# Patient Record
Sex: Male | Born: 1970 | Race: Black or African American | Hispanic: No | Smoking: Current some day smoker
Health system: Southern US, Community
[De-identification: ages and names within clinical notes are randomized; demographics above are authoritative.]

## PROBLEM LIST (undated history)

## (undated) DIAGNOSIS — M1990 Unspecified osteoarthritis, unspecified site: Secondary | ICD-10-CM

## (undated) DIAGNOSIS — E119 Type 2 diabetes mellitus without complications: Secondary | ICD-10-CM

## (undated) DIAGNOSIS — R0609 Other forms of dyspnea: Secondary | ICD-10-CM

## (undated) DIAGNOSIS — I10 Essential (primary) hypertension: Secondary | ICD-10-CM

## (undated) DIAGNOSIS — R079 Chest pain, unspecified: Secondary | ICD-10-CM

## (undated) HISTORY — DX: Other forms of dyspnea: R06.09

## (undated) HISTORY — PX: WRIST SURGERY: SHX841

## (undated) HISTORY — PX: FOOT SURGERY: SHX648

## (undated) HISTORY — DX: Chest pain, unspecified: R07.9

---

## 2002-06-15 ENCOUNTER — Emergency Department (HOSPITAL_COMMUNITY): Admission: EM | Admit: 2002-06-15 | Discharge: 2002-06-15 | Payer: Self-pay

## 2003-10-15 ENCOUNTER — Inpatient Hospital Stay (HOSPITAL_COMMUNITY): Admission: EM | Admit: 2003-10-15 | Discharge: 2003-10-16 | Payer: Self-pay | Admitting: *Deleted

## 2003-10-25 ENCOUNTER — Encounter: Admission: RE | Admit: 2003-10-25 | Discharge: 2003-10-25 | Payer: Self-pay | Admitting: Internal Medicine

## 2004-07-03 ENCOUNTER — Emergency Department (HOSPITAL_COMMUNITY): Admission: EM | Admit: 2004-07-03 | Discharge: 2004-07-03 | Payer: Self-pay | Admitting: Emergency Medicine

## 2005-04-20 ENCOUNTER — Emergency Department (HOSPITAL_COMMUNITY): Admission: EM | Admit: 2005-04-20 | Discharge: 2005-04-20 | Payer: Self-pay | Admitting: *Deleted

## 2008-07-04 ENCOUNTER — Emergency Department (HOSPITAL_COMMUNITY): Admission: EM | Admit: 2008-07-04 | Discharge: 2008-07-04 | Payer: Self-pay | Admitting: *Deleted

## 2010-02-23 ENCOUNTER — Emergency Department (HOSPITAL_COMMUNITY): Admission: EM | Admit: 2010-02-23 | Discharge: 2010-02-23 | Payer: Self-pay | Admitting: Family Medicine

## 2010-03-22 ENCOUNTER — Emergency Department (HOSPITAL_COMMUNITY): Admission: EM | Admit: 2010-03-22 | Discharge: 2010-03-22 | Payer: Self-pay | Admitting: Family Medicine

## 2011-04-06 NOTE — Discharge Summary (Signed)
NAME:  Jeffery Houston, Jeffery Houston NO.:  [id number]   MEDICAL RECORD NO.:  [id number]                   PATIENT TYPE:  INP   LOCATION:  5729                                 FACILITY:  [facility]   PHYSICIAN:  Madaline Guthrie, M.D.                 DATE OF BIRTH:  January 19, 1971   DATE OF ADMISSION:  10/15/2003  DATE OF DISCHARGE:  10/16/2003                                 DISCHARGE SUMMARY   DISCHARGE DIAGNOSIS:  Fungal infection of both feet with bacterial versus  fungal abscess growth underneath the soles of feet.   DISCHARGE MEDICATIONS:  1. Lamisil 250 mg p.o. daily x3 months.  2. Keflex 500 mg four times daily x1 week p.o.  3. Over-the-counter antifungal cream applied daily to feet.   CONDITION AT DISCHARGE:  Condition at discharge was stable.   BRIEF ADMITTING HISTORY AND PHYSICAL:  This is a 40 year old African  American male with chief complaint of swelling in the ED.  After he had  eaten Thanksgiving dinner, the patient noticed that his arms and parts of  his face were swelling.  He came to the emergency department and was given  80 mg of prednisone and Benadryl and swelling subsided.  The patient then  mentioned to the ED attending that he had noticed small blisters on his feet  for the last week and a half, first noticed it on his left sole 12 days  prior to admission.  He initially had no pain but the blisters spread to the  soles of both feet over 1 week and developed 2/10 pinprick pain and some  pain with walking.  He first noticed yellow pustules on the bottoms of both  feet the night before admission.  The patient denied any new footwear or  exposures, does not complain of itching or numbness.  The pain does not  radiate.  The patient has had no fevers or chills and no similar problems  like this in the past.  The patient works in [employer].   ALLERGIES:  The patient has no allergies.   PAST MEDICAL HISTORY:  No past medical history.  No  hospitalizations.   MEDICATIONS:  Takes no medications.   SOCIAL HISTORY:  He is a current smoker, 1 pack per day for 15 years.  He  drinks 1 beer per day, denies cocaine use.  The patient is engaged and lives  with his fiancee.  The patient was recently in prison for 3 months and was  released in September.   FAMILY HISTORY:  Mother with diabetes, father with hypertension.  Siblings:  Sister died of unknown causes in 34.  The patient has four children with  no medical problems.   REVIEW OF SYSTEMS:  Review of systems as per HPI, otherwise, negative.   PHYSICAL EXAMINATION:  VITALS ON ADMISSION:  Pulse 97, blood pressure  144/76, temperature 98.1, respirations 20, oxygen saturation  98% on room  air.  GENERAL:  No apparent distress.  Appears stated age.  HEENT:  Eyes:  Extraocular muscles intact.  Pupils equal, round and reactive  to light and accommodation.  No scleral icterus.  ENT:  Nose and throat  clear.  Mucous membranes moist with mild edema of the lower lip.  NECK:  No stiffness or meningismus.  RESPIRATORY:  Clear to auscultation bilaterally.  Good respiratory effort.  CARDIOVASCULAR:  Regular rate and rhythm.  No murmurs, rubs, or gallops.  Pulses 2+ and equal.  GI:  Soft, nontender and nondistended.  Normoactive bowel sounds.  No  hepatosplenomegaly.  EXTREMITIES:  Bilateral hands mildly edematous.  No lower extremity edema.  Extremities nontender.  GU:  No discharge, no rash.  SKIN:  Bilateral soles of feet were callused with painless blisters and  pustules, fluid filled; some material was able to be expressed from a small  cut in the sole of his foot.  There was fungal infection of the toes of both  feet, otherwise, the skin was normal with no other rashes or lesions.  LYMPHATICS:  No lymphadenopathy.  MUSCULOSKELETAL:  Normal strength, full range of motion.  No sensory  deficits.  NEUROLOGIC:  Cranial nerves II-XII grossly intact.  PSYCHIATRIC:  Awake, alert and  oriented x3.   ADMISSION LABORATORIES:  White blood cell count 14.7, hemoglobin 16.5,  hematocrit 48.9, platelets 267,000; ANC was 13.5.  Gram's stain and culture  of the wounds were taken in the emergency department.   HOSPITAL COURSE:  The patient was started on IV vancomycin.  Cultures were  taken of the fluid expressed from his feet.  The patient's hospital course  was unremarkable.  He remained afebrile overnight and it was determined that  he was ready for discharge and could be followed up as an outpatient with  oral antibiotics and oral antifungal medications.   DISCHARGE LABORATORIES:  Urine drug screen was positive for THC.  Blood  cultures were negative x2.  HIV antibody was negative.  Discharge CBC:  White count of 17.8, hemoglobin 14.6, hematocrit 42.0 and platelet count of  305,000.  Note:  White blood cell count likely elevated secondary to 80 mg  of prednisone received in the emergency department the previous night.  RPR  was nonreactive.  Chlamydial CR was negative.  Secondary bacterial culture  of foot showed gram-positive rods.  Fungal culture of foot shows no growth  to date.   FOLLOWUP:  The patient has an outpatient followup appointment on Monday,  October 25, 2003, in the outpatient clinic at [facility].      Hillery Aldo, M.D.                      Madaline Guthrie, M.D.    CR/MEDQ  D:  10/19/2003  T:  10/20/2003  Job:  [id number]

## 2013-03-24 ENCOUNTER — Encounter (HOSPITAL_COMMUNITY): Payer: Self-pay | Admitting: Emergency Medicine

## 2013-03-24 ENCOUNTER — Emergency Department (HOSPITAL_COMMUNITY)
Admission: EM | Admit: 2013-03-24 | Discharge: 2013-03-24 | Disposition: A | Discharge status: Home / Self Care | Payer: Medicaid Other | Attending: Emergency Medicine | Admitting: Emergency Medicine

## 2013-03-24 ENCOUNTER — Emergency Department (HOSPITAL_COMMUNITY): Payer: Medicaid Other

## 2013-03-24 DIAGNOSIS — Z91040 Latex allergy status: Secondary | ICD-10-CM | POA: Insufficient documentation

## 2013-03-24 DIAGNOSIS — Z8739 Personal history of other diseases of the musculoskeletal system and connective tissue: Secondary | ICD-10-CM | POA: Insufficient documentation

## 2013-03-24 DIAGNOSIS — M25569 Pain in unspecified knee: Secondary | ICD-10-CM | POA: Insufficient documentation

## 2013-03-24 DIAGNOSIS — Z9889 Other specified postprocedural states: Secondary | ICD-10-CM | POA: Insufficient documentation

## 2013-03-24 DIAGNOSIS — I10 Essential (primary) hypertension: Secondary | ICD-10-CM | POA: Insufficient documentation

## 2013-03-24 DIAGNOSIS — M25562 Pain in left knee: Secondary | ICD-10-CM

## 2013-03-24 HISTORY — DX: Essential (primary) hypertension: I10

## 2013-03-24 MED ORDER — HYDROCODONE-ACETAMINOPHEN 5-325 MG PO TABS: 1.0000 | ORAL_TABLET | Freq: Four times a day (QID) | ORAL | Status: DC | PRN

## 2013-03-24 MED ORDER — HYDROCHLOROTHIAZIDE 25 MG PO TABS: 25.0000 mg | ORAL_TABLET | Freq: Every day | ORAL | Status: DC

## 2013-03-24 NOTE — ED Provider Notes (Signed)
History     CSN: [id number]  Arrival date & time 03/24/13  1146   First MD Initiated Contact with Patient 03/24/13 1251      Chief Complaint  Patient presents with  . Knee Pain    (Consider location/radiation/quality/duration/timing/severity/associated sxs/prior treatment) HPI Comments: Patient presents with bilateral knee pain.  Pain has been present for the past 2 years, but worsened over the past week.  Pain worse with going up stairs, bending, and ambulating.  He also reports that he does a lot of standing at work, which worsens the pain.  No acute injury or trauma.  He has had Arthroscopic surgery of both knees in the past.  Orthopedist was in [place].  He has taken Ibuprofen and Tylenol with minimal relief.  He reports mild swelling of both knees.  No erythema or warmth of the knees.  No fever or chills.  No numbness or tingling.  The history is provided by the patient.    Past Medical History  Diagnosis Date  . Hypertension     History reviewed. No pertinent past surgical history.  History reviewed. No pertinent family history.  History  Substance Use Topics  . Smoking status: Never Smoker   . Smokeless tobacco: Current User  . Alcohol Use: Yes      Review of Systems  Allergies  Latex  Home Medications   Current Outpatient Rx  Name  Route  Sig  Dispense  Refill  . hydrochlorothiazide (HYDRODIURIL) 25 MG tablet   Oral   Take 25 mg by mouth daily.           BP 159/62  Pulse 83  Temp(Src) 98.5 F (36.9 C) (Oral)  Resp 18  SpO2 96%  Physical Exam  Nursing note and vitals reviewed. Constitutional: He appears well-developed and well-nourished. No distress.  HENT:  Head: Normocephalic and atraumatic.  Neck: Normal range of motion. Neck supple.  Cardiovascular: Normal rate, regular rhythm and normal heart sounds.   Pulses:      Dorsalis pedis pulses are 2+ on the right side, and 2+ on the left side.  Pulmonary/Chest: Effort normal and breath sounds  normal.  Musculoskeletal:       Right knee: He exhibits normal range of motion, no effusion, no deformity, no erythema and normal patellar mobility.       Left knee: He exhibits normal range of motion, no swelling, no effusion, no ecchymosis, no deformity and no erythema.  Neurological: He is alert. No sensory deficit.  Skin: Skin is warm and dry. He is not diaphoretic.  No erythema or warmth of the knees bilaterally  Psychiatric: He has a normal mood and affect.    ED Course  Procedures (including critical care time)  Labs Reviewed - No data to display Dg Knee Complete 4 Views Left  03/24/2013  *RADIOLOGY REPORT*  Clinical Data: Knee pain  LEFT KNEE - COMPLETE 4+ VIEW  Comparison: None.  Findings: Four views of the left knee submitted.  No acute fracture or subluxation.  No radiopaque foreign body.  IMPRESSION: No acute fracture or subluxation.   Original Report Authenticated By: Natasha Mead, M.D.    Dg Knee Complete 4 Views Right  03/24/2013  *RADIOLOGY REPORT*  Clinical Data: Bilateral knee pain  RIGHT KNEE - COMPLETE 4+ VIEW  Comparison: None.  Findings: Four views of the right knee submitted.  No acute fracture or subluxation.  Mild narrowing of medial joint compartment.  Mild spurring of medial tibial plateau.  Mild spurring  of the patella.  IMPRESSION: No acute fracture or subluxation.  Mild degenerative changes.   Original Report Authenticated By: Natasha Mead, M.D.      No diagnosis found.    MDM  Patient presenting with bilateral knee pain.  He reports that he has been diagnosed with Arthritis in the past and has had Arthroscopic surgery of both knees in the past.  No signs of infection at this time.  Patient able to ambulate.  Neurovascularly intact.  Patient given short course of pain medication and referral to Orthopedics.  Return precautions given.        Pascal Lux [place], PA-C 03/25/13 [phone]

## 2013-03-24 NOTE — ED Notes (Signed)
Pt c/o bilateral knee pain x several days; pt denies new injury at present but hx of pain in knees

## 2013-03-26 NOTE — ED Provider Notes (Signed)
Medical screening examination/treatment/procedure(s) were performed by non-physician practitioner and as supervising physician I was immediately available for consultation/collaboration.   Jeremih Dearmas, MD 03/26/13 1530 

## 2013-04-11 ENCOUNTER — Encounter (HOSPITAL_COMMUNITY): Payer: Self-pay | Admitting: Family Medicine

## 2013-04-11 ENCOUNTER — Emergency Department (HOSPITAL_COMMUNITY)
Admission: EM | Admit: 2013-04-11 | Discharge: 2013-04-11 | Disposition: A | Discharge status: Home / Self Care | Payer: Medicaid Other | Attending: Emergency Medicine | Admitting: Emergency Medicine

## 2013-04-11 DIAGNOSIS — S0502XA Injury of conjunctiva and corneal abrasion without foreign body, left eye, initial encounter: Secondary | ICD-10-CM

## 2013-04-11 DIAGNOSIS — X58XXXA Exposure to other specified factors, initial encounter: Secondary | ICD-10-CM | POA: Insufficient documentation

## 2013-04-11 DIAGNOSIS — Y9389 Activity, other specified: Secondary | ICD-10-CM | POA: Insufficient documentation

## 2013-04-11 DIAGNOSIS — I10 Essential (primary) hypertension: Secondary | ICD-10-CM | POA: Insufficient documentation

## 2013-04-11 DIAGNOSIS — S058X9A Other injuries of unspecified eye and orbit, initial encounter: Secondary | ICD-10-CM | POA: Insufficient documentation

## 2013-04-11 DIAGNOSIS — Y9289 Other specified places as the place of occurrence of the external cause: Secondary | ICD-10-CM | POA: Insufficient documentation

## 2013-04-11 DIAGNOSIS — Z23 Encounter for immunization: Secondary | ICD-10-CM | POA: Insufficient documentation

## 2013-04-11 MED ORDER — ERYTHROMYCIN 5 MG/GM OP OINT: TOPICAL_OINTMENT | OPHTHALMIC | Status: DC

## 2013-04-11 MED ORDER — FLUORESCEIN SODIUM 1 MG OP STRP
1.0000 | ORAL_STRIP | Freq: Once | OPHTHALMIC | Status: AC
  Administered 2013-04-11: 1 via OPHTHALMIC
  Filled 2013-04-11: qty 1

## 2013-04-11 MED ORDER — TETANUS-DIPHTH-ACELL PERTUSSIS 5-2.5-18.5 LF-MCG/0.5 IM SUSP
0.5000 mL | Freq: Once | INTRAMUSCULAR | Status: AC
  Administered 2013-04-11: 0.5 mL via INTRAMUSCULAR
  Filled 2013-04-11: qty 0.5

## 2013-04-11 MED ORDER — TETRACAINE HCL 0.5 % OP SOLN
2.0000 [drp] | Freq: Once | OPHTHALMIC | Status: AC
  Administered 2013-04-11: 2 [drp] via OPHTHALMIC
  Filled 2013-04-11: qty 2

## 2013-04-11 NOTE — ED Provider Notes (Signed)
Medical screening examination/treatment/procedure(s) were performed by non-physician practitioner and as supervising physician I was immediately available for consultation/collaboration.   Carleene Cooper III, MD 04/11/13 2017

## 2013-04-11 NOTE — ED Provider Notes (Signed)
History     CSN: [id number]  Arrival date & time 04/11/13  0907   First MD Initiated Contact with Patient 04/11/13 [phone]      Chief Complaint  Patient presents with  . Eye Problem    (Consider location/radiation/quality/duration/timing/severity/associated sxs/prior treatment) HPI Comments: Jeffery Houston is a 42 y/o M with PMHx of HTN presenting to the ED, with wife, complaining about left eye pain. Patient stated that the discomfort started on yesterday morning. Reported that he was sitting outside, looking up at the trees on Thursday and felt like something fell into his eye. Stated that he has "foreign body" sensation - stated that "there is something in my eye." reported that he has been having mildly blurry vision secondary due to the tearing of eyes. Stated that he had an episode of blurry vision the other day - reported that it was only one episode. Stated that he has been having pain to the left eye that is constant, stating it as something in his eye, reporting that the discomfort gets worse when he closes his eyes. Patient stated that he has sensitivity to light, pain is worse when blinking and closing his eyes, worse when scratching his eyes. Stated that there has been mild swelling and redness noted starting yesterday to the left eye. Stated that when he woke up this morning he had "crusts" develop in his left eyelashes and had to separate the eyelid by hand to open. Denied fever, chills, sweating, discharge from the eye, loss of vision, ear pain, congestion, headache.      The history is provided by the patient. No language interpreter was used.    Past Medical History  Diagnosis Date  . Hypertension     History reviewed. No pertinent past surgical history.  History reviewed. No pertinent family history.  History  Substance Use Topics  . Smoking status: Never Smoker   . Smokeless tobacco: Current User  . Alcohol Use: Yes      Review of Systems  Constitutional:  Negative for fever, chills and fatigue.  HENT: Negative for congestion, sore throat, trouble swallowing, neck pain and neck stiffness.   Eyes: Positive for photophobia, pain, redness and visual disturbance.       Foreign body sensation to the left eye  Respiratory: Negative for cough, chest tightness and shortness of breath.   Cardiovascular: Negative for chest pain.  Gastrointestinal: Negative for abdominal pain.  Genitourinary: Negative for decreased urine volume and difficulty urinating.  Neurological: Negative for dizziness, light-headedness and headaches.  All other systems reviewed and are negative.    Allergies  Latex  Home Medications   Current Outpatient Rx  Name  Route  Sig  Dispense  Refill  . hydrochlorothiazide (HYDRODIURIL) 25 MG tablet   Oral   Take 25 mg by mouth daily.         Marland Kitchen HYDROcodone-acetaminophen (NORCO/VICODIN) 5-325 MG per tablet   Oral   Take 1-2 tablets by mouth every 6 (six) hours as needed for pain.   30 tablet   0   . erythromycin ophthalmic ointment      Place a 1/2 inch ribbon of ointment into the lower eyelid QID x 5 days.   3.5 g   0     There were no vitals taken for this visit.  Physical Exam  Nursing note and vitals reviewed. Constitutional: He is oriented to person, place, and time. He appears well-developed and well-nourished. No distress.  HENT:  Head: Normocephalic and atraumatic.  Mouth/Throat: Oropharynx is clear and moist. No oropharyngeal exudate.  Uvula midline, symmetrical elevation  Eyes: Conjunctivae and EOM are normal. Pupils are equal, round, and reactive to light. Right eye exhibits no discharge and no exudate. Left eye exhibits no discharge. Foreign body present in the left eye. Right conjunctiva is not injected. Right conjunctiva has no hemorrhage. Left conjunctiva has no hemorrhage. Right eye exhibits normal extraocular motion and no nystagmus. Left eye exhibits normal extraocular motion and no nystagmus. Right  pupil is round and reactive. Left pupil is round and reactive.  Slit lamp exam:      The right eye shows no corneal abrasion, no corneal flare, no corneal ulcer, no foreign body, no hyphema, no hypopyon and no fluorescein uptake.       The left eye shows corneal abrasion. The left eye shows no corneal flare, no corneal ulcer, no foreign body, no hyphema, no hypopyon and no fluorescein uptake.    Left eye: very minimal swelling noted to the left eye. Very little erythema noted to the left eye. Sclera white without injection. Positive tearing upon exam. Full ROM without pain.   Neck: Normal range of motion. Neck supple. No tracheal deviation present. No thyromegaly present.  Negative neck stiffness Negative nuchal rigidity Negative lymphadenopathy  Cardiovascular: Normal rate, regular rhythm and normal heart sounds.  Exam reveals no friction rub.   No murmur heard. Pulses:      Radial pulses are 2+ on the right side, and 2+ on the left side.  Pulmonary/Chest: Effort normal and breath sounds normal. No respiratory distress. He has no wheezes. He has no rales.  Lymphadenopathy:    He has no cervical adenopathy.  Neurological: He is alert and oriented to person, place, and time. No cranial nerve deficit. He exhibits normal muscle tone. Coordination normal.  Skin: Skin is warm and dry. No rash noted. He is not diaphoretic. No erythema.  Psychiatric: He has a normal mood and affect. His behavior is normal. Thought content normal.    ED Course  Procedures (including critical care time)  Labs Reviewed - No data to display No results found.   1. Corneal abrasion, left, initial encounter   2. HTN (hypertension)       MDM  Patient afebrile, normotensive, non-tachycardic, non-tachypneic, alert and oriented. Patient presenting with corneal abrasion noted to the left eye. Left eye swiped with swab and negative foreign body noted. Slit Lamp exam noted corneal abrasion to the left eye.  Flourescein negative for Seidel's sign. Negative dendritic lesions noted. Mild swelling noted to the left eye - less likely to think preseptal cellulitis. Visual acuity negative findings - right 20/30, left 20/50. Negative pain with motion of EOMs - EOMs fully intact - less likely to think postseptal cellulitis. Patient aseptic, non-toxic appearing, in no acute distress. Discharged patient. Discharged patient with Erythromycin ointment to be applied to left eye - discussed instructions with patient. Discussed with patient eye protection, eye care, and protection. Referred patient t [facility] and ophthalmology. Discussed with patient to monitor symptoms and if symptoms are to worsen or change to report back to the ED. Patient agreed to plan of care, understood, all questions answered.  TDaP booster vaccine given.         Raymon Mutton, PA-C 04/11/13 1745

## 2013-04-11 NOTE — ED Notes (Signed)
Patient advises that he was sitting outside on Wednesday evening and something fell into his left eye. He advises that he had a crusting noted to the left eye. Complaints of pain in the left eye.

## 2013-04-11 NOTE — ED Notes (Signed)
Per pt sts a few days of eye irritation. sts he had flushed it out but still has the sensation that something is in there. sts the eyedrops caused burning.

## 2015-04-01 ENCOUNTER — Emergency Department (HOSPITAL_COMMUNITY)
Admission: EM | Admit: 2015-04-01 | Discharge: 2015-04-01 | Disposition: A | Discharge status: Home / Self Care | Payer: Medicaid Other | Attending: Emergency Medicine | Admitting: Emergency Medicine

## 2015-04-01 ENCOUNTER — Encounter (HOSPITAL_COMMUNITY): Payer: Self-pay | Admitting: Emergency Medicine

## 2015-04-01 DIAGNOSIS — R6884 Jaw pain: Secondary | ICD-10-CM | POA: Diagnosis present

## 2015-04-01 DIAGNOSIS — E119 Type 2 diabetes mellitus without complications: Secondary | ICD-10-CM | POA: Insufficient documentation

## 2015-04-01 DIAGNOSIS — I10 Essential (primary) hypertension: Secondary | ICD-10-CM | POA: Diagnosis not present

## 2015-04-01 DIAGNOSIS — Z79899 Other long term (current) drug therapy: Secondary | ICD-10-CM | POA: Insufficient documentation

## 2015-04-01 DIAGNOSIS — M26629 Arthralgia of temporomandibular joint, unspecified side: Secondary | ICD-10-CM

## 2015-04-01 DIAGNOSIS — Z91040 Latex allergy status: Secondary | ICD-10-CM | POA: Insufficient documentation

## 2015-04-01 DIAGNOSIS — M2662 Arthralgia of temporomandibular joint: Secondary | ICD-10-CM | POA: Insufficient documentation

## 2015-04-01 HISTORY — DX: Type 2 diabetes mellitus without complications: E11.9

## 2015-04-01 MED ORDER — KETOROLAC TROMETHAMINE 60 MG/2ML IM SOLN
60.0000 mg | Freq: Once | INTRAMUSCULAR | Status: AC
  Administered 2015-04-01: 60 mg via INTRAMUSCULAR
  Filled 2015-04-01: qty 2

## 2015-04-01 MED ORDER — CYCLOBENZAPRINE HCL 10 MG PO TABS: 10.0000 mg | ORAL_TABLET | Freq: Two times a day (BID) | ORAL | Status: DC | PRN

## 2015-04-01 NOTE — Discharge Instructions (Signed)
Return to the emergency room with worsening of symptoms, new symptoms or with symptoms that are concerning, especially unable to open your mouth.  Rest, Ice (three cycles of 15 mins on, 15 mins off at least twice a day) Ibuprofen 400mg  (2 tablets 200mg ) every 5-6 hours for 3-5 days. Flexeril for severe pain at night. Soft foods diet Follow up with PCP if symptoms worsen or are persistent. Read below information and follow recommendations. Temporomandibular Problems  Temporomandibular joint (TMJ) dysfunction means there are problems with the joint between your jaw and your skull. This is a joint lined by cartilage like other joints in your body but also has a small disc in the joint which keeps the bones from rubbing on each other. These joints are like other joints and can get inflamed (sore) from arthritis and other problems. When this joint gets sore, it can cause headaches and pain in the jaw and the face. CAUSES  Usually the arthritic types of problems are caused by soreness in the joint. Soreness in the joint can also be caused by overuse. This may come from grinding your teeth. It may also come from mis-alignment in the joint. DIAGNOSIS Diagnosis of this condition can often be made by history and exam. Sometimes your caregiver may need X-rays or an MRI scan to determine the exact cause. It may be necessary to see your dentist to determine if your teeth and jaws are lined up correctly. TREATMENT  Most of the time this problem is not serious; however, sometimes it can persist (become chronic). When this happens medications that will cut down on inflammation (soreness) help. Sometimes a shot of cortisone into the joint will be helpful. If your teeth are not aligned it may help for your dentist to make a splint for your mouth that can help this problem. If no physical problems can be found, the problem may come from tension. If tension is found to be the cause, biofeedback or relaxation techniques  may be helpful. HOME CARE INSTRUCTIONS   Later in the day, applications of ice packs may be helpful. Ice can be used in a plastic bag with a towel around it to prevent frostbite to skin. This may be used about every 2 hours for 20 to 30 minutes, as needed while awake, or as directed by your caregiver.  Only take over-the-counter or prescription medicines for pain, discomfort, or fever as directed by your caregiver.  If physical therapy was prescribed, follow your caregiver's directions.  Wear mouth appliances as directed if they were given. Document Released: 07/31/2001 Document Revised: 01/28/2012 Document Reviewed: 11/07/2008 [facility]CExitCare Patient Information 2015 [place], [place]. This information is not intended to replace advice given to you by your health care provider. Make sure you discuss any questions you have with your health care provider.

## 2015-04-01 NOTE — ED Provider Notes (Signed)
CSN: [id number]     Arrival date & time 04/01/15  [id number] History  This chart was scribed for non-physician practitioner, Oswaldo ConroyVictoria Neysa Arts, working with Gilda Creasehristopher J Pollina, MD by Richarda Overlieichard Holland, ED Scribe. This patient was seen in room TR08C/TR08C and the patient's care was started at 9:39 AM.  Chief Complaint  Patient presents with  . Jaw Pain   The history is provided by the patient. No language interpreter was used.   HPI Comments: Jeffery Houston is a 44 y.o. male with a history of HTN and DM who presents to the Emergency Department complaining of right sided TMJ jaw pain for the last week. Pt states that chewing aggravates his pain and states when he bites into anything it feels like it is popping out of socket. He says he has tried no treatments or medications for his pain. Pt reports no similar prior episodes. He denies any history of smoking. Pt denies any new life stressors and says that he does not chew on anything. Pt reports no alleviating factors at this time. He denies fever, chills, nausea, sore throat or vomiting.   Past Medical History  Diagnosis Date  . Hypertension   . Diabetes mellitus without complication    Past Surgical History  Procedure Laterality Date  . Foot surgery     No family history on file. History  Substance Use Topics  . Smoking status: Never Smoker   . Smokeless tobacco: Current User  . Alcohol Use: Yes    Review of Systems  Constitutional: Negative for fever and chills.  HENT: Negative for sore throat.   Gastrointestinal: Negative for nausea and vomiting.  Musculoskeletal: Positive for arthralgias. Negative for neck pain.   Allergies  Latex  Home Medications   Prior to Admission medications   Medication Sig Start Date End Date Taking? Authorizing Provider  cyclobenzaprine (FLEXERIL) 10 MG tablet Take 1 tablet (10 mg total) by mouth 2 (two) times daily as needed for muscle spasms. 04/01/15   Oswaldo ConroyVictoria Panagiotis Oelkers, PA-C  erythromycin ophthalmic  ointment Place a 1/2 inch ribbon of ointment into the lower eyelid QID x 5 days. 04/11/13   Marissa Sciacca, PA-C  hydrochlorothiazide (HYDRODIURIL) 25 MG tablet Take 25 mg by mouth daily.    Historical Provider, MD  HYDROcodone-acetaminophen (NORCO/VICODIN) 5-325 MG per tablet Take 1-2 tablets by mouth every 6 (six) hours as needed for pain. 03/24/13   Heather Laisure, PA-C   BP 147/88 mmHg  Pulse 76  Temp(Src) 97.8 F (36.6 C) (Oral)  Resp 18  Ht 6' 4.5" (1.943 m)  Wt 283 lb (128.368 kg)  BMI 34.00 kg/m2  SpO2 98%   Physical Exam  Constitutional: He appears well-developed and well-nourished. No distress.  HENT:  Head: Normocephalic and atraumatic.  Tenderness to right TMJ. No crepitus to right or left TMJ. No overlying skin changes. No lymphadenopathy. Oropharynx no erythema or edema. No trismus. Uvula midline.   Eyes: Conjunctivae are normal. Right eye exhibits no discharge. Left eye exhibits no discharge.  Pulmonary/Chest: Effort normal. No respiratory distress.  Lymphadenopathy:    He has no cervical adenopathy.  Neurological: He is alert. Coordination normal.  Skin: No rash noted. He is not diaphoretic.  Psychiatric: He has a normal mood and affect. His behavior is normal.  Nursing note and vitals reviewed.   ED Course  Procedures   DIAGNOSTIC STUDIES: Oxygen Saturation is 98% on RA, normal by my interpretation.    COORDINATION OF CARE: 9:45 AM Discussed treatment plan with pt  at bedside and pt agreed to plan.  Labs Review Labs Reviewed - No data to display  Imaging Review No results found.   EKG Interpretation None      MDM   Final diagnoses:  Temporomandibular joint (TMJ) pain   Pt with TMJ pain. Discussed RICE ibuprofen and flexeril for severe pain at night. Soft food diet. Follow up with PCP for persistent symptoms.  Discussed return precautions with patient. Patient verbalizes understanding and agrees with plan.  I personally performed the services  described in this documentation, which was scribed in my presence. The recorded information has been reviewed and is accurate.  Oswaldo ConroyVictoria Nicolas Sisler, PA-C 04/01/15 1008  Gilda Creasehristopher J Pollina, MD 04/01/15 1311

## 2015-04-01 NOTE — ED Notes (Signed)
C/o right TMJ pain x 1 week.

## 2015-07-11 ENCOUNTER — Emergency Department (HOSPITAL_COMMUNITY)
Admission: EM | Admit: 2015-07-11 | Discharge: 2015-07-11 | Disposition: A | Discharge status: Home / Self Care | Payer: Medicaid Other | Attending: Emergency Medicine | Admitting: Emergency Medicine

## 2015-07-11 ENCOUNTER — Encounter (HOSPITAL_COMMUNITY): Payer: Self-pay | Admitting: *Deleted

## 2015-07-11 DIAGNOSIS — Z79899 Other long term (current) drug therapy: Secondary | ICD-10-CM | POA: Diagnosis not present

## 2015-07-11 DIAGNOSIS — I10 Essential (primary) hypertension: Secondary | ICD-10-CM | POA: Insufficient documentation

## 2015-07-11 DIAGNOSIS — Z87891 Personal history of nicotine dependence: Secondary | ICD-10-CM | POA: Insufficient documentation

## 2015-07-11 DIAGNOSIS — Y929 Unspecified place or not applicable: Secondary | ICD-10-CM | POA: Diagnosis not present

## 2015-07-11 DIAGNOSIS — E119 Type 2 diabetes mellitus without complications: Secondary | ICD-10-CM | POA: Diagnosis not present

## 2015-07-11 DIAGNOSIS — Y999 Unspecified external cause status: Secondary | ICD-10-CM | POA: Insufficient documentation

## 2015-07-11 DIAGNOSIS — S8992XA Unspecified injury of left lower leg, initial encounter: Secondary | ICD-10-CM | POA: Diagnosis present

## 2015-07-11 DIAGNOSIS — Y939 Activity, unspecified: Secondary | ICD-10-CM | POA: Insufficient documentation

## 2015-07-11 DIAGNOSIS — Z91040 Latex allergy status: Secondary | ICD-10-CM | POA: Insufficient documentation

## 2015-07-11 DIAGNOSIS — G8929 Other chronic pain: Secondary | ICD-10-CM | POA: Insufficient documentation

## 2015-07-11 DIAGNOSIS — X58XXXA Exposure to other specified factors, initial encounter: Secondary | ICD-10-CM | POA: Insufficient documentation

## 2015-07-11 DIAGNOSIS — Z792 Long term (current) use of antibiotics: Secondary | ICD-10-CM | POA: Insufficient documentation

## 2015-07-11 DIAGNOSIS — M25562 Pain in left knee: Secondary | ICD-10-CM

## 2015-07-11 MED ORDER — HYDROCODONE-ACETAMINOPHEN 5-325 MG PO TABS
2.0000 | ORAL_TABLET | Freq: Once | ORAL | Status: AC
  Administered 2015-07-11: 2 via ORAL
  Filled 2015-07-11: qty 2

## 2015-07-11 MED ORDER — HYDROCODONE-ACETAMINOPHEN 5-325 MG PO TABS
1.0000 | ORAL_TABLET | Freq: Once | ORAL | Status: AC
  Filled 2015-07-11: qty 1

## 2015-07-11 NOTE — Discharge Instructions (Signed)
There does not appear to be an emergent cause her symptoms at this time. Please continue to take your medications as prescribed. Follow-up with your orthopedist for regularly scheduled appointment on Wednesday. Return to ED for worsening symptoms.  Arthralgia Your caregiver has diagnosed you as suffering from an arthralgia. Arthralgia means there is pain in a joint. This can come from many reasons including:  Bruising the joint which causes soreness (inflammation) in the joint.  Wear and tear on the joints which occur as we grow older (osteoarthritis).  Overusing the joint.  Various forms of arthritis.  Infections of the joint. Regardless of the cause of pain in your joint, most of these different pains respond to anti-inflammatory drugs and rest. The exception to this is when a joint is infected, and these cases are treated with antibiotics, if it is a bacterial infection. HOME CARE INSTRUCTIONS   Rest the injured area for as long as directed by your caregiver. Then slowly start using the joint as directed by your caregiver and as the pain allows. Crutches as directed may be useful if the ankles, knees or hips are involved. If the knee was splinted or casted, continue use and care as directed. If an stretchy or elastic wrapping bandage has been applied today, it should be removed and re-applied every 3 to 4 hours. It should not be applied tightly, but firmly enough to keep swelling down. Watch toes and feet for swelling, bluish discoloration, coldness, numbness or excessive pain. If any of these problems (symptoms) occur, remove the ace bandage and re-apply more loosely. If these symptoms persist, contact your caregiver or return to this location.  For the first 24 hours, keep the injured extremity elevated on pillows while lying down.  Apply ice for 15-20 minutes to the sore joint every couple hours while awake for the first half day. Then 03-04 times per day for the first 48 hours. Put the  ice in a plastic bag and place a towel between the bag of ice and your skin.  Wear any splinting, casting, elastic bandage applications, or slings as instructed.  Only take over-the-counter or prescription medicines for pain, discomfort, or fever as directed by your caregiver. Do not use aspirin immediately after the injury unless instructed by your physician. Aspirin can cause increased bleeding and bruising of the tissues.  If you were given crutches, continue to use them as instructed and do not resume weight bearing on the sore joint until instructed. Persistent pain and inability to use the sore joint as directed for more than 2 to 3 days are warning signs indicating that you should see a caregiver for a follow-up visit as soon as possible. Initially, a hairline fracture (break in bone) may not be evident on X-rays. Persistent pain and swelling indicate that further evaluation, non-weight bearing or use of the joint (use of crutches or slings as instructed), or further X-rays are indicated. X-rays may sometimes not show a small fracture until a week or 10 days later. Make a follow-up appointment with your own caregiver or one to whom we have referred you. A radiologist (specialist in reading X-rays) may read your X-rays. Make sure you know how you are to obtain your X-ray results. Do not assume everything is normal if you do not hear from [place]. SEEK MEDICAL CARE IF: Bruising, swelling, or pain increases. SEEK IMMEDIATE MEDICAL CARE IF:   Your fingers or toes are numb or blue.  The pain is not responding to medications and continues  to stay the same or get worse.  The pain in your joint becomes severe.  You develop a fever over 102 F (38.9 C).  It becomes impossible to move or use the joint. MAKE SURE YOU:   Understand these instructions.  Will watch your condition.  Will get help right away if you are not doing well or get worse. Document Released: 11/05/2005 Document Revised:  01/28/2012 Document Reviewed: 06/23/2008 [facility] Patient Information 2015 [place], [place]. This information is not intended to replace advice given to you by your health care provider. Make sure you discuss any questions you have with your health care provider.

## 2015-07-11 NOTE — ED Notes (Signed)
Pt states he hurt his knee in 2012. Pt states he has had multiple surgeries on his knees. Pt states he is having popping and pain that is new today in his left knee.

## 2015-07-11 NOTE — ED Notes (Signed)
Dropped one tab on the floor. Wasted and pulled a new one.

## 2015-07-11 NOTE — ED Provider Notes (Signed)
CSN: [id number]     Arrival date & time 07/11/15  1040 History  This chart was scribed for non-physician practitioner, Joycie Peek, PA-C, working with Lavera Guise, MD by Charline Bills, ED Scribe. This patient was seen in room TR09C/TR09C and the patient's care was started at 12:05 PM.   Chief Complaint  Patient presents with  . Knee Pain   The history is provided by the patient. No language interpreter was used.   HPI Comments: Jeffery Houston is a 44 y.o. male, with a h/o HTN and DM, who presents to the Emergency Department complaining of constant left knee pain since 2012, worsened over the past few days. No known injury. Pt reports increased pain and "popping" in his left knee with movement. He initially injured his knees in 2012 following an accident at work. He states that he had a cortisone injection in both knees on July 1. Pt has an upcoming appointment with ortho to prep for a knee replacement in 2 days. He denies numbness in lower extremities, fever, chills, night sweats, nausea, vomiting, chest pain, SOB, urinary or bowel incontinence. No known medical allergies.   Past Medical History  Diagnosis Date  . Hypertension   . Diabetes mellitus without complication    Past Surgical History  Procedure Laterality Date  . Foot surgery     History reviewed. No pertinent family history. Social History  Substance Use Topics  . Smoking status: Never Smoker   . Smokeless tobacco: Current User  . Alcohol Use: Yes    Review of Systems  Constitutional: Negative for fever, chills and diaphoresis.  Respiratory: Negative for shortness of breath.   Cardiovascular: Negative for chest pain.  Gastrointestinal: Negative for nausea and vomiting.  Musculoskeletal: Positive for arthralgias.  Neurological: Negative for numbness.  All other systems reviewed and are negative.  Allergies  Latex  Home Medications   Prior to Admission medications   Medication Sig Start Date End Date Taking?  Authorizing Provider  amLODipine (NORVASC) 10 MG tablet Take 10 mg by mouth daily. 01/11/15   Historical Provider, MD  cyclobenzaprine (FLEXERIL) 10 MG tablet Take 1 tablet (10 mg total) by mouth 2 (two) times daily as needed for muscle spasms. 04/01/15   Oswaldo Conroy, PA-C  erythromycin ophthalmic ointment Place a 1/2 inch ribbon of ointment into the lower eyelid QID x 5 days. Patient not taking: Reported on 04/01/2015 04/11/13   Marissa Sciacca, PA-C  HYDROcodone-acetaminophen (NORCO/VICODIN) 5-325 MG per tablet Take 1-2 tablets by mouth every 6 (six) hours as needed for pain. 03/24/13   Heather Laisure, PA-C  lisinopril (PRINIVIL,ZESTRIL) 2.5 MG tablet Take 2.5 mg by mouth daily. 01/11/15   Historical Provider, MD  metFORMIN (GLUCOPHAGE) 500 MG tablet Take 500 mg by mouth 2 (two) times daily. 02/15/15   Historical Provider, MD  PRESCRIPTION MEDICATION Apply 1 application topically daily as needed (eczema).    Historical Provider, MD   BP 155/93 mmHg  Pulse 84  Temp(Src) 97.5 F (36.4 C) (Oral)  Resp 16  Ht 6\' 4"  (1.93 m)  Wt 293 lb (132.904 kg)  BMI 35.68 kg/m2  SpO2 97% Physical Exam  Constitutional: He is oriented to person, place, and time. He appears well-developed and well-nourished. No distress.  HENT:  Head: Normocephalic and atraumatic.  Eyes: Conjunctivae and EOM are normal.  Neck: Neck supple. No tracheal deviation present.  Cardiovascular: Normal rate, regular rhythm and normal heart sounds.   Pulmonary/Chest: Effort normal and breath sounds normal. No respiratory distress.  Abdominal: Soft. There is no tenderness.  Musculoskeletal: Normal range of motion.  L knee: no focal tenderness. No obvious effusion or other swelling. No erythema or warmth. No ligamentous laxity. ROM is grossly intact but is limited at the end of full flexion secondary to pain. Muscle compartments are soft. NVI.  Neurological: He is alert and oriented to person, place, and time.  Skin: Skin is warm and  dry.  Psychiatric: He has a normal mood and affect. His behavior is normal.  Nursing note and vitals reviewed.  ED Course  Procedures (including critical care time) DIAGNOSTIC STUDIES: Oxygen Saturation is 97% on RA, normal by my interpretation.    COORDINATION OF CARE: 12:09 PM-Discussed treatment plan which includes Norco with pt at bedside and pt agreed to plan.   Labs Review Labs Reviewed - No data to display  Imaging Review No results found. I have personally reviewed and evaluated these images and lab results as part of my medical decision-making.   EKG Interpretation None     Meds given in ED:  Medications  HYDROcodone-acetaminophen (NORCO/VICODIN) 5-325 MG per tablet 2 tablet (2 tablets Oral Given 07/11/15 1223)  HYDROcodone-acetaminophen (NORCO/VICODIN) 5-325 MG per tablet 1 tablet (0 tablets Oral Duplicate 07/11/15 1228)    New Prescriptions   No medications on file   Filed Vitals:   07/11/15 1132 07/11/15 1133  BP: 155/93   Pulse: 84   Temp: 97.5 F (36.4 C)   TempSrc: Oral   Resp: 16   Height:   (1.93 m)  Weight:  293 lb (132.904 kg)  SpO2: 97%     MDM  Vitals stable - WNL -afebrile Pt resting comfortably in ED. patient reports pain is much improved after oral analgesia in the ED. PE--physical exam is grossly benign. Left knee without any effusion, erythema, other swelling or warmth. Maintains full active range of motion. Low suspicion for septic joint, hemarthrosis. Muscle compartment soft. Normal neuro exam.  Discussed maintaining appointment with orthopedist for regularly scheduled appointment on Wednesday. Patient states she has naproxen at home that he will take until that time. No evidence of other acute or emergent pathology at this time. I discussed all relevant lab findings and imaging results with pt and they verbalized understanding. Discussed f/u with PCP within 48 hrs and return precautions, pt very amenable to plan.  Final  diagnoses:  Chronic knee pain, left    I personally performed the services described in this documentation, which was scribed in my presence. The recorded information has been reviewed and is accurate.    Joycie Peek, PA-C 07/11/15 1328  Lavera Guise, MD 07/11/15 [id number]

## 2015-07-11 NOTE — ED Notes (Signed)
PT is in stable condition upon d/c and ambulates from ED. 

## 2015-07-15 ENCOUNTER — Other Ambulatory Visit: Payer: Self-pay

## 2015-07-15 ENCOUNTER — Encounter (HOSPITAL_BASED_OUTPATIENT_CLINIC_OR_DEPARTMENT_OTHER)
Admission: RE | Admit: 2015-07-15 | Discharge: 2015-07-15 | Disposition: A | Discharge status: Home / Self Care | Payer: Medicaid Other | Source: Ambulatory Visit | Attending: Orthopedic Surgery | Admitting: Orthopedic Surgery

## 2015-07-15 ENCOUNTER — Encounter (HOSPITAL_BASED_OUTPATIENT_CLINIC_OR_DEPARTMENT_OTHER): Payer: Self-pay | Admitting: *Deleted

## 2015-07-15 DIAGNOSIS — Z01818 Encounter for other preprocedural examination: Secondary | ICD-10-CM | POA: Diagnosis present

## 2015-07-15 DIAGNOSIS — M1712 Unilateral primary osteoarthritis, left knee: Secondary | ICD-10-CM | POA: Insufficient documentation

## 2015-07-15 LAB — BASIC METABOLIC PANEL
Anion gap: 9 (ref 5–15)
BUN: 9 mg/dL (ref 6–20)
CHLORIDE: 105 mmol/L (ref 101–111)
CO2: 24 mmol/L (ref 22–32)
Calcium: 9.1 mg/dL (ref 8.9–10.3)
Creatinine, Ser: 0.88 mg/dL (ref 0.61–1.24)
GFR calc Af Amer: >60 mL/min (ref >60)
GFR calc non Af Amer: >60 mL/min (ref >60)
GLUCOSE: 145 mg/dL — AB (ref 65–99)
POTASSIUM: 4 mmol/L (ref 3.5–5.1)
Sodium: 138 mmol/L (ref 135–145)

## 2015-07-15 LAB — SURGICAL PCR SCREEN
MRSA, PCR: NEGATIVE
Staphylococcus aureus: NEGATIVE

## 2015-07-19 NOTE — H&P (Signed)
  PREOPERATIVE H&P  Chief Complaint: UNLATERIAL PRIMARY OSTEOARTHRITIS LEFT KNEE   HPI: Jeffery Houston is a 44 y.o. male who presents for preoperative history and physical with a diagnosis of UNLATERIAL PRIMARY OSTEOARTHRITIS LEFT KNEE . Symptoms are rated as moderate to severe, and have been worsening.  This is significantly impairing activities of daily living.  He has elected for surgical management.   Past Medical History  Diagnosis Date  . Hypertension   . Diabetes mellitus without complication   . Arthritis     bil knees   Past Surgical History  Procedure Laterality Date  . Foot surgery     Social History   Social History  . Marital Status: Single    Spouse Name: N/A  . Number of Children: N/A  . Years of Education: N/A   Social History Main Topics  . Smoking status: Former Smoker    Quit date: 05/14/2010  . Smokeless tobacco: Former Neurosurgeon  . Alcohol Use: Yes     Comment: social  . Drug Use: No  . Sexual Activity: Not Asked   Other Topics Concern  . None   Social History Narrative   History reviewed. No pertinent family history. Allergies  Allergen Reactions  . Latex Hives   Prior to Admission medications   Medication Sig Start Date End Date Taking? Authorizing Provider  amLODipine (NORVASC) 10 MG tablet Take 10 mg by mouth daily. 01/11/15  Yes Historical Provider, MD  cyclobenzaprine (FLEXERIL) 10 MG tablet Take 1 tablet (10 mg total) by mouth 2 (two) times daily as needed for muscle spasms. 04/01/15  Yes Oswaldo Conroy, PA-C  metFORMIN (GLUCOPHAGE) 500 MG tablet Take 500 mg by mouth 2 (two) times daily. 02/15/15  Yes Historical Provider, MD  PRESCRIPTION MEDICATION Apply 1 application topically daily as needed (eczema).   Yes Historical Provider, MD  traMADol (ULTRAM) 50 MG tablet Take by mouth every 6 (six) hours as needed.   Yes Historical Provider, MD  HYDROcodone-acetaminophen (NORCO/VICODIN) 5-325 MG per tablet Take 1-2 tablets by mouth every 6 (six) hours  as needed for pain. 03/24/13   Heather Laisure, PA-C     Positive ROS: All other systems have been reviewed and were otherwise negative with the exception of those mentioned in the HPI and as above.  Physical Exam: General: Alert, no acute distress Cardiovascular: No pedal edema Respiratory: No cyanosis, no use of accessory musculature GI: No organomegaly, abdomen is soft and non-tender Skin: No lesions in the area of chief complaint Neurologic: Sensation intact distally Psychiatric: Patient is competent for consent with normal mood and affect Lymphatic: No axillary or cervical lymphadenopathy  MUSCULOSKELETAL:  Well appearing male in no apparent distress.  Pain is primarily medial.  He has correctable varus.  He has a stable ligament exam.   Assessment: UNLATERIAL PRIMARY OSTEOARTHRITIS LEFT KNEE   Plan: Plan for Procedure(s): LEF PARTIAL KNEE REPLACEMENT   The risks benefits and alternatives were discussed with the patient including but not limited to the risks of nonoperative treatment, versus surgical intervention including infection, bleeding, nerve injury,  blood clots, cardiopulmonary complications, morbidity, mortality, among others, and they were willing to proceed.   Lynann Bologna, PA-C  07/19/2015 3:02 PM

## 2015-07-22 ENCOUNTER — Encounter (HOSPITAL_BASED_OUTPATIENT_CLINIC_OR_DEPARTMENT_OTHER)
Admission: RE | Disposition: A | Discharge status: Home / Self Care | Payer: Self-pay | Source: Ambulatory Visit | Attending: Orthopedic Surgery

## 2015-07-22 ENCOUNTER — Ambulatory Visit (HOSPITAL_BASED_OUTPATIENT_CLINIC_OR_DEPARTMENT_OTHER): Payer: Medicaid Other | Admitting: Anesthesiology

## 2015-07-22 ENCOUNTER — Ambulatory Visit (HOSPITAL_COMMUNITY): Payer: Medicaid Other

## 2015-07-22 ENCOUNTER — Ambulatory Visit (HOSPITAL_BASED_OUTPATIENT_CLINIC_OR_DEPARTMENT_OTHER)
Admission: RE | Admit: 2015-07-22 | Discharge: 2015-07-23 | Disposition: A | Discharge status: Home / Self Care | Payer: Medicaid Other | Source: Ambulatory Visit | Attending: Orthopedic Surgery | Admitting: Orthopedic Surgery

## 2015-07-22 ENCOUNTER — Encounter (HOSPITAL_BASED_OUTPATIENT_CLINIC_OR_DEPARTMENT_OTHER): Payer: Self-pay | Admitting: Certified Registered"

## 2015-07-22 DIAGNOSIS — I10 Essential (primary) hypertension: Secondary | ICD-10-CM | POA: Diagnosis not present

## 2015-07-22 DIAGNOSIS — E119 Type 2 diabetes mellitus without complications: Secondary | ICD-10-CM | POA: Insufficient documentation

## 2015-07-22 DIAGNOSIS — M1712 Unilateral primary osteoarthritis, left knee: Secondary | ICD-10-CM | POA: Diagnosis present

## 2015-07-22 DIAGNOSIS — Z87891 Personal history of nicotine dependence: Secondary | ICD-10-CM | POA: Insufficient documentation

## 2015-07-22 DIAGNOSIS — Z96652 Presence of left artificial knee joint: Secondary | ICD-10-CM

## 2015-07-22 HISTORY — DX: Unspecified osteoarthritis, unspecified site: M19.90

## 2015-07-22 HISTORY — PX: PARTIAL KNEE ARTHROPLASTY: SHX2174

## 2015-07-22 LAB — GLUCOSE, CAPILLARY
GLUCOSE-CAPILLARY: 114 mg/dL — AB (ref 65–99)
GLUCOSE-CAPILLARY: 127 mg/dL — AB (ref 65–99)

## 2015-07-22 LAB — POCT HEMOGLOBIN-HEMACUE: Hemoglobin: 14 g/dL (ref 13.0–17.0)

## 2015-07-22 SURGERY — ARTHROPLASTY, KNEE, UNICOMPARTMENTAL
Anesthesia: Regional | Site: Knee | Laterality: Left

## 2015-07-22 MED ORDER — KETOROLAC TROMETHAMINE 30 MG/ML IJ SOLN: 30.0000 mg | Freq: Once | INTRAMUSCULAR | Status: DC

## 2015-07-22 MED ORDER — CELECOXIB 200 MG PO CAPS
200.0000 mg | ORAL_CAPSULE | Freq: Two times a day (BID) | ORAL | Status: DC
  Administered 2015-07-22: 200 mg via ORAL
  Filled 2015-07-22: qty 1

## 2015-07-22 MED ORDER — POTASSIUM CHLORIDE IN NACL 20-0.45 MEQ/L-% IV SOLN: INTRAVENOUS | Status: DC

## 2015-07-22 MED ORDER — ACETAMINOPHEN 650 MG RE SUPP: 650.0000 mg | Freq: Four times a day (QID) | RECTAL | Status: DC | PRN

## 2015-07-22 MED ORDER — FENTANYL CITRATE (PF) 100 MCG/2ML IJ SOLN
INTRAMUSCULAR | Status: AC
  Filled 2015-07-22: qty 2

## 2015-07-22 MED ORDER — ACETAMINOPHEN 500 MG PO TABS
1000.0000 mg | ORAL_TABLET | Freq: Once | ORAL | Status: AC
  Administered 2015-07-22: 1000 mg via ORAL

## 2015-07-22 MED ORDER — METHOCARBAMOL 500 MG PO TABS
500.0000 mg | ORAL_TABLET | Freq: Four times a day (QID) | ORAL | Status: DC | PRN
Start: 2015-07-22 — End: 2015-07-23
  Administered 2015-07-22 – 2015-07-23 (×2): 500 mg via ORAL
  Filled 2015-07-22 (×2): qty 1

## 2015-07-22 MED ORDER — CHLORHEXIDINE GLUCONATE 4 % EX LIQD: 60.0000 mL | Freq: Once | CUTANEOUS | Status: DC

## 2015-07-22 MED ORDER — PHENOL 1.4 % MT LIQD: 1.0000 | OROMUCOSAL | Status: DC | PRN

## 2015-07-22 MED ORDER — HYDROMORPHONE HCL 1 MG/ML IJ SOLN
INTRAMUSCULAR | Status: AC
  Filled 2015-07-22: qty 1

## 2015-07-22 MED ORDER — ONDANSETRON HCL 4 MG PO TABS: 4.0000 mg | ORAL_TABLET | Freq: Three times a day (TID) | ORAL | Status: DC | PRN

## 2015-07-22 MED ORDER — OXYCODONE HCL 5 MG PO TABS
5.0000 mg | ORAL_TABLET | ORAL | Status: DC | PRN
  Administered 2015-07-22 – 2015-07-23 (×4): 10 mg via ORAL
  Filled 2015-07-22 (×4): qty 2

## 2015-07-22 MED ORDER — CEFAZOLIN SODIUM-DEXTROSE 2-3 GM-% IV SOLR
2.0000 g | Freq: Four times a day (QID) | INTRAVENOUS | Status: AC
  Administered 2015-07-22 – 2015-07-23 (×2): 2 g via INTRAVENOUS
  Filled 2015-07-22 (×2): qty 50

## 2015-07-22 MED ORDER — PROPOFOL 10 MG/ML IV BOLUS
INTRAVENOUS | Status: AC
  Filled 2015-07-22: qty 20

## 2015-07-22 MED ORDER — PHENYLEPHRINE HCL 10 MG/ML IJ SOLN
INTRAMUSCULAR | Status: DC | PRN
  Administered 2015-07-22: 40 ug via INTRAVENOUS

## 2015-07-22 MED ORDER — METOCLOPRAMIDE HCL 5 MG/ML IJ SOLN: 5.0000 mg | Freq: Three times a day (TID) | INTRAMUSCULAR | Status: DC | PRN

## 2015-07-22 MED ORDER — ONDANSETRON HCL 4 MG/2ML IJ SOLN
INTRAMUSCULAR | Status: DC | PRN
  Administered 2015-07-22: 4 mg via INTRAVENOUS

## 2015-07-22 MED ORDER — LIDOCAINE HCL (CARDIAC) 20 MG/ML IV SOLN
INTRAVENOUS | Status: DC | PRN
  Administered 2015-07-22: 50 mg via INTRAVENOUS

## 2015-07-22 MED ORDER — OXYCODONE HCL 5 MG PO TABS: 5.0000 mg | ORAL_TABLET | Freq: Once | ORAL | Status: DC | PRN

## 2015-07-22 MED ORDER — DEXAMETHASONE SODIUM PHOSPHATE 10 MG/ML IJ SOLN: 10.0000 mg | Freq: Once | INTRAMUSCULAR | Status: DC

## 2015-07-22 MED ORDER — ACETAMINOPHEN 325 MG PO TABS: 650.0000 mg | ORAL_TABLET | Freq: Four times a day (QID) | ORAL | Status: DC | PRN

## 2015-07-22 MED ORDER — SCOPOLAMINE 1 MG/3DAYS TD PT72: 1.0000 | MEDICATED_PATCH | Freq: Once | TRANSDERMAL | Status: DC | PRN

## 2015-07-22 MED ORDER — SODIUM CHLORIDE 0.9 % IV SOLN
INTRAVENOUS | Status: DC
  Administered 2015-07-22: 18:00:00 via INTRAVENOUS

## 2015-07-22 MED ORDER — LIDOCAINE HCL (CARDIAC) 20 MG/ML IV SOLN
INTRAVENOUS | Status: AC
  Filled 2015-07-22: qty 5

## 2015-07-22 MED ORDER — DEXAMETHASONE SODIUM PHOSPHATE 10 MG/ML IJ SOLN
INTRAMUSCULAR | Status: AC
  Filled 2015-07-22: qty 1

## 2015-07-22 MED ORDER — LACTATED RINGERS IV SOLN
INTRAVENOUS | Status: DC
  Administered 2015-07-22 (×3): via INTRAVENOUS

## 2015-07-22 MED ORDER — FENTANYL CITRATE (PF) 100 MCG/2ML IJ SOLN
50.0000 ug | INTRAMUSCULAR | Status: DC | PRN
  Administered 2015-07-22: 100 ug via INTRAVENOUS

## 2015-07-22 MED ORDER — OXYCODONE HCL 5 MG/5ML PO SOLN: 5.0000 mg | Freq: Once | ORAL | Status: DC | PRN

## 2015-07-22 MED ORDER — CEFAZOLIN SODIUM-DEXTROSE 2-3 GM-% IV SOLR
INTRAVENOUS | Status: AC
  Filled 2015-07-22: qty 50

## 2015-07-22 MED ORDER — ONDANSETRON HCL 4 MG PO TABS: 4.0000 mg | ORAL_TABLET | Freq: Four times a day (QID) | ORAL | Status: DC | PRN

## 2015-07-22 MED ORDER — HYDROMORPHONE HCL 1 MG/ML IJ SOLN
0.2500 mg | INTRAMUSCULAR | Status: DC | PRN
Start: 2015-07-22 — End: 2015-07-23
  Administered 2015-07-22 (×2): 0.5 mg via INTRAVENOUS

## 2015-07-22 MED ORDER — FENTANYL CITRATE (PF) 100 MCG/2ML IJ SOLN
INTRAMUSCULAR | Status: AC
  Filled 2015-07-22: qty 4

## 2015-07-22 MED ORDER — MENTHOL 3 MG MT LOZG: 1.0000 | LOZENGE | OROMUCOSAL | Status: DC | PRN

## 2015-07-22 MED ORDER — DOCUSATE SODIUM 100 MG PO CAPS: 100.0000 mg | ORAL_CAPSULE | Freq: Two times a day (BID) | ORAL | Status: DC

## 2015-07-22 MED ORDER — FENTANYL CITRATE (PF) 100 MCG/2ML IJ SOLN
INTRAMUSCULAR | Status: DC | PRN
  Administered 2015-07-22 (×4): 50 ug via INTRAVENOUS
  Administered 2015-07-22: 100 ug via INTRAVENOUS

## 2015-07-22 MED ORDER — BUPIVACAINE-EPINEPHRINE (PF) 0.5% -1:200000 IJ SOLN
INTRAMUSCULAR | Status: DC | PRN
  Administered 2015-07-22: 25 mL

## 2015-07-22 MED ORDER — PHENYLEPHRINE HCL 10 MG/ML IJ SOLN
INTRAMUSCULAR | Status: AC
Start: 2015-07-22 — End: 2015-07-22
  Filled 2015-07-22: qty 1

## 2015-07-22 MED ORDER — MIDAZOLAM HCL 2 MG/2ML IJ SOLN
1.0000 mg | INTRAMUSCULAR | Status: DC | PRN
Start: 2015-07-22 — End: 2015-07-22
  Administered 2015-07-22: 2 mg via INTRAVENOUS

## 2015-07-22 MED ORDER — DEXTROSE 5 % IV SOLN
3.0000 g | INTRAVENOUS | Status: AC
  Administered 2015-07-22: 3 g via INTRAVENOUS

## 2015-07-22 MED ORDER — ONDANSETRON HCL 4 MG/2ML IJ SOLN
INTRAMUSCULAR | Status: AC
  Filled 2015-07-22: qty 2

## 2015-07-22 MED ORDER — PROMETHAZINE HCL 25 MG/ML IJ SOLN: 6.2500 mg | INTRAMUSCULAR | Status: DC | PRN

## 2015-07-22 MED ORDER — DEXAMETHASONE SODIUM PHOSPHATE 10 MG/ML IJ SOLN
INTRAMUSCULAR | Status: DC | PRN
  Administered 2015-07-22: 10 mg via INTRAVENOUS

## 2015-07-22 MED ORDER — ONDANSETRON HCL 4 MG/2ML IJ SOLN: 4.0000 mg | Freq: Four times a day (QID) | INTRAMUSCULAR | Status: DC | PRN

## 2015-07-22 MED ORDER — AMLODIPINE BESYLATE 10 MG PO TABS: 10.0000 mg | ORAL_TABLET | Freq: Every day | ORAL | Status: DC

## 2015-07-22 MED ORDER — METFORMIN HCL 500 MG PO TABS
500.0000 mg | ORAL_TABLET | Freq: Two times a day (BID) | ORAL | Status: DC
  Administered 2015-07-22: 500 mg via ORAL

## 2015-07-22 MED ORDER — GLYCOPYRROLATE 0.2 MG/ML IJ SOLN: 0.2000 mg | Freq: Once | INTRAMUSCULAR | Status: DC | PRN

## 2015-07-22 MED ORDER — OXYCODONE-ACETAMINOPHEN 5-325 MG PO TABS: 1.0000 | ORAL_TABLET | ORAL | Status: DC | PRN

## 2015-07-22 MED ORDER — PROPOFOL 10 MG/ML IV BOLUS
INTRAVENOUS | Status: DC | PRN
  Administered 2015-07-22: 300 mg via INTRAVENOUS

## 2015-07-22 MED ORDER — METHOCARBAMOL 1000 MG/10ML IJ SOLN: 500.0000 mg | Freq: Four times a day (QID) | INTRAVENOUS | Status: DC | PRN

## 2015-07-22 MED ORDER — METOCLOPRAMIDE HCL 5 MG PO TABS: 5.0000 mg | ORAL_TABLET | Freq: Three times a day (TID) | ORAL | Status: DC | PRN

## 2015-07-22 MED ORDER — CYCLOBENZAPRINE HCL 10 MG PO TABS: 10.0000 mg | ORAL_TABLET | Freq: Two times a day (BID) | ORAL | Status: DC | PRN

## 2015-07-22 MED ORDER — CEFAZOLIN SODIUM 1-5 GM-% IV SOLN
INTRAVENOUS | Status: AC
  Filled 2015-07-22: qty 50

## 2015-07-22 MED ORDER — ACETAMINOPHEN 500 MG PO TABS
ORAL_TABLET | ORAL | Status: AC
  Filled 2015-07-22: qty 1

## 2015-07-22 MED ORDER — MIDAZOLAM HCL 2 MG/2ML IJ SOLN
INTRAMUSCULAR | Status: AC
  Filled 2015-07-22: qty 2

## 2015-07-22 MED ORDER — ASPIRIN 325 MG PO TABS: 325.0000 mg | ORAL_TABLET | Freq: Every day | ORAL | Status: DC

## 2015-07-22 MED ORDER — HYDROMORPHONE HCL 1 MG/ML IJ SOLN: 1.0000 mg | INTRAMUSCULAR | Status: DC | PRN

## 2015-07-22 MED ORDER — ASPIRIN EC 325 MG PO TBEC: 325.0000 mg | DELAYED_RELEASE_TABLET | Freq: Every day | ORAL | Status: DC

## 2015-07-22 SURGICAL SUPPLY — 66 items
APL SKNCLS STERI-STRIP NONHPOA (GAUZE/BANDAGES/DRESSINGS) ×1
BANDAGE ELASTIC 4 VELCRO ST LF (GAUZE/BANDAGES/DRESSINGS) ×3 IMPLANT
BANDAGE ELASTIC 6 VELCRO ST LF (GAUZE/BANDAGES/DRESSINGS) ×3 IMPLANT
BANDAGE ESMARK 6X9 LF (GAUZE/BANDAGES/DRESSINGS) ×1 IMPLANT
BENZOIN TINCTURE PRP APPL 2/3 (GAUZE/BANDAGES/DRESSINGS) ×3 IMPLANT
BLADE SURG 10 STRL SS (BLADE) ×3 IMPLANT
BLADE SURG 15 STRL LF DISP TIS (BLADE) ×2 IMPLANT
BLADE SURG 15 STRL SS (BLADE) ×6
BNDG CMPR 9X6 STRL LF SNTH (GAUZE/BANDAGES/DRESSINGS) ×1
BNDG ESMARK 6X9 LF (GAUZE/BANDAGES/DRESSINGS) ×3
BONE CEMENT PALACOSE (Orthopedic Implant) ×3 IMPLANT
BOWL SMART MIX CTS (DISPOSABLE) ×3 IMPLANT
CANISTER SUCT 1200ML W/VALVE (MISCELLANEOUS) ×1 IMPLANT
CAPT KNEE PARTIAL 2 ×2 IMPLANT
CEMENT BONE PALACOSE (Orthopedic Implant) IMPLANT
CHLORAPREP W/TINT 26ML (MISCELLANEOUS) ×3 IMPLANT
CLOSURE STERI-STRIP 1/2X4 (GAUZE/BANDAGES/DRESSINGS) ×1
CLSR STERI-STRIP ANTIMIC 1/2X4 (GAUZE/BANDAGES/DRESSINGS) ×2 IMPLANT
COVER BACK TABLE 60X90IN (DRAPES) ×3 IMPLANT
CUFF TOURNIQUET SINGLE 34IN LL (TOURNIQUET CUFF) ×2 IMPLANT
DRAPE EXTREMITY T 121X128X90 (DRAPE) ×3 IMPLANT
DRAPE INCISE IOBAN 66X45 STRL (DRAPES) ×3 IMPLANT
DRAPE U 20/CS (DRAPES) ×3 IMPLANT
DRAPE U-SHAPE 47X51 STRL (DRAPES) ×3 IMPLANT
DRSG MEPILEX BORDER 4X8 (GAUZE/BANDAGES/DRESSINGS) ×3 IMPLANT
ELECT REM PT RETURN 9FT ADLT (ELECTROSURGICAL) ×3
ELECTRODE REM PT RTRN 9FT ADLT (ELECTROSURGICAL) ×1 IMPLANT
GAUZE SPONGE 4X4 12PLY STRL (GAUZE/BANDAGES/DRESSINGS) ×3 IMPLANT
GLOVE BIO SURGEON STRL SZ7 (GLOVE) ×1 IMPLANT
GLOVE BIO SURGEON STRL SZ7.5 (GLOVE) ×1 IMPLANT
GLOVE BIOGEL PI IND STRL 7.0 (GLOVE) ×1 IMPLANT
GLOVE BIOGEL PI IND STRL 8 (GLOVE) ×1 IMPLANT
GLOVE BIOGEL PI INDICATOR 7.0 (GLOVE) ×2
GLOVE BIOGEL PI INDICATOR 8 (GLOVE) ×2
GOWN STRL REUS W/ TWL LRG LVL3 (GOWN DISPOSABLE) ×3 IMPLANT
GOWN STRL REUS W/TWL LRG LVL3 (GOWN DISPOSABLE) ×9
IMMOBILIZER KNEE 22 UNIV (SOFTGOODS) IMPLANT
IMMOBILIZER KNEE 24 THIGH 36 (MISCELLANEOUS) ×1 IMPLANT
IMMOBILIZER KNEE 24 UNIV (MISCELLANEOUS) ×3
KNEE WRAP E Z 3 GEL PACK (MISCELLANEOUS) ×3 IMPLANT
LIQUID BAND (GAUZE/BANDAGES/DRESSINGS) ×3 IMPLANT
MANIFOLD NEPTUNE II (INSTRUMENTS) ×5 IMPLANT
NS IRRIG 1000ML POUR BTL (IV SOLUTION) ×3 IMPLANT
PACK ARTHROSCOPY DSU (CUSTOM PROCEDURE TRAY) ×3 IMPLANT
PACK BASIN DAY SURGERY FS (CUSTOM PROCEDURE TRAY) ×3 IMPLANT
PADDING CAST COTTON 6X4 STRL (CAST SUPPLIES) ×3 IMPLANT
PENCIL BUTTON HOLSTER BLD 10FT (ELECTRODE) ×3 IMPLANT
SAWBLADE OXFORD PARTIAL (BLADE) ×3 IMPLANT
SHEET MEDIUM DRAPE 40X70 STRL (DRAPES) ×3 IMPLANT
SLEEVE SCD COMPRESS KNEE MED (MISCELLANEOUS) ×3 IMPLANT
SPONGE LAP 18X18 X RAY DECT (DISPOSABLE) ×3 IMPLANT
SUCTION FRAZIER TIP 10 FR DISP (SUCTIONS) ×3 IMPLANT
SUT MNCRL AB 4-0 PS2 18 (SUTURE) ×3 IMPLANT
SUT MON AB 2-0 CT1 36 (SUTURE) ×3 IMPLANT
SUT VIC AB 0 CT1 27 (SUTURE) ×3
SUT VIC AB 0 CT1 27XBRD ANBCTR (SUTURE) ×1 IMPLANT
SUT VIC AB 1 CT1 27 (SUTURE) ×3
SUT VIC AB 1 CT1 27XBRD ANBCTR (SUTURE) ×1 IMPLANT
SUT VIC AB 2-0 SH 27 (SUTURE)
SUT VIC AB 2-0 SH 27XBRD (SUTURE) IMPLANT
SYR BULB IRRIGATION 50ML (SYRINGE) ×3 IMPLANT
TOWEL OR 17X24 6PK STRL BLUE (TOWEL DISPOSABLE) ×3 IMPLANT
TOWEL OR NON WOVEN STRL DISP B (DISPOSABLE) ×6 IMPLANT
TUBE CONNECTING 20'X1/4 (TUBING)
TUBE CONNECTING 20X1/4 (TUBING) IMPLANT
YANKAUER SUCT BULB TIP NO VENT (SUCTIONS) ×3 IMPLANT

## 2015-07-22 NOTE — Discharge Instructions (Signed)
INSTRUCTIONS AFTER JOINT REPLACEMENT  ° °o Remove items at home which could result in a fall. This includes throw rugs or furniture in walking pathways °o ICE to the affected joint every three hours while awake for 30 minutes at a time, for at least the first 3-5 days, and then as needed for pain and swelling.  Continue to use ice for pain and swelling. You may notice swelling that will progress down to the foot and ankle.  This is normal after surgery.  Elevate your leg when you are not up walking on it.   °o Continue to use the breathing machine you got in the hospital (incentive spirometer) which will help keep your temperature down.  It is common for your temperature to cycle up and down following surgery, especially at night when you are not up moving around and exerting yourself.  The breathing machine keeps your lungs expanded and your temperature down. ° ° °DIET:  As you were doing prior to hospitalization, we recommend a well-balanced diet. ° °DRESSING / WOUND CARE / SHOWERING ° °Keep the surgical dressing until follow up.  IF THE DRESSING FALLS OFF or the wound gets wet inside, change the dressing with sterile gauze.  Please use good hand washing techniques before changing the dressing.  Do not use any lotions or creams on the incision until instructed by your surgeon.   ° °ACTIVITY ° °o Increase activity slowly as tolerated, but follow the weight bearing instructions below.   °o No driving for 6 weeks or until further direction given by your physician.  You cannot drive while taking narcotics.  °o No lifting or carrying greater than 10 lbs. until further directed by your surgeon. °o Avoid periods of inactivity such as sitting longer than an hour when not asleep. This helps prevent blood clots.  °o You may return to work once you are authorized by your doctor.  ° ° ° °WEIGHT BEARING  ° °Weight bearing as tolerated with assist device (walker, cane, etc) as directed, use it as long as suggested by your  surgeon or therapist, typically at least 4-6 weeks. ° ° °EXERCISES ° °Results after joint replacement surgery are often greatly improved when you follow the exercise, range of motion and muscle strengthening exercises prescribed by your doctor. Safety measures are also important to protect the joint from further injury. Any time any of these exercises cause you to have increased pain or swelling, decrease what you are doing until you are comfortable again and then slowly increase them. If you have problems or questions, call your caregiver or physical therapist for advice.  ° °Rehabilitation is important following a joint replacement. After just a few days of immobilization, the muscles of the leg can become weakened and shrink (atrophy).  These exercises are designed to build up the tone and strength of the thigh and leg muscles and to improve motion. Often times heat used for twenty to thirty minutes before working out will loosen up your tissues and help with improving the range of motion but do not use heat for the first two weeks following surgery (sometimes heat can increase post-operative swelling).  ° °These exercises can be done on a training (exercise) mat, on the floor, on a table or on a bed. Use whatever works the best and is most comfortable for you.    Use music or television while you are exercising so that the exercises are a pleasant break in your day. This will make your life   better with the exercises acting as a break in your routine that you can look forward to.   Perform all exercises about fifteen times, three times per day or as directed.  You should exercise both the operative leg and the other leg as well. ° °Exercises include: °  °• Quad Sets - Tighten up the muscle on the front of the thigh (Quad) and hold for 5-10 seconds.   °• Straight Leg Raises - With your knee straight (if you were given a brace, keep it on), lift the leg to 60 degrees, hold for 3 seconds, and slowly lower the leg.   Perform this exercise against resistance later as your leg gets stronger.  °• Leg Slides: Lying on your back, slowly slide your foot toward your buttocks, bending your knee up off the floor (only go as far as is comfortable). Then slowly slide your foot back down until your leg is flat on the floor again.  °• Angel Wings: Lying on your back spread your legs to the side as far apart as you can without causing discomfort.  °• Hamstring Strength:  Lying on your back, push your heel against the floor with your leg straight by tightening up the muscles of your buttocks.  Repeat, but this time bend your knee to a comfortable angle, and push your heel against the floor.  You may put a pillow under the heel to make it more comfortable if necessary.  ° °A rehabilitation program following joint replacement surgery can speed recovery and prevent re-injury in the future due to weakened muscles. Contact your doctor or a physical therapist for more information on knee rehabilitation.  ° ° °CONSTIPATION ° °Constipation is defined medically as fewer than three stools per week and severe constipation as less than one stool per week.  Even if you have a regular bowel pattern at home, your normal regimen is likely to be disrupted due to multiple reasons following surgery.  Combination of anesthesia, postoperative narcotics, change in appetite and fluid intake all can affect your bowels.  ° °YOU MUST use at least one of the following options; they are listed in order of increasing strength to get the job done.  They are all available over the counter, and you may need to use some, POSSIBLY even all of these options:   ° °Drink plenty of fluids (prune juice may be helpful) and high fiber foods °Colace 100 mg by mouth twice a day  °Senokot for constipation as directed and as needed Dulcolax (bisacodyl), take with full glass of water  °Miralax (polyethylene glycol) once or twice a day as needed. ° °If you have tried all these things and  are unable to have a bowel movement in the first 3-4 days after surgery call either your surgeon or your primary doctor.   ° °If you experience loose stools or diarrhea, hold the medications until you stool forms back up.  If your symptoms do not get better within 1 week or if they get worse, check with your doctor.  If you experience "the worst abdominal pain ever" or develop nausea or vomiting, please contact the office immediately for further recommendations for treatment. ° ° °ITCHING:  If you experience itching with your medications, try taking only a single pain pill, or even half a pain pill at a time.  You can also use Benadryl over the counter for itching or also to help with sleep.  ° °TED HOSE STOCKINGS:  Use stockings on both legs   until for at least 2 weeks or as directed by physician office. They may be removed at night for sleeping.  MEDICATIONS:  See your medication summary on the After Visit Summary that nursing will review with you.  You may have some home medications which will be placed on hold until you complete the course of blood thinner medication.  It is important for you to complete the blood thinner medication as prescribed.  PRECAUTIONS:  If you experience chest pain or shortness of breath - call 911 immediately for transfer to the hospital emergency department.   If you develop a fever greater that 101 F, purulent drainage from wound, increased redness or drainage from wound, foul odor from the wound/dressing, or calf pain - CONTACT YOUR SURGEON.                                                   FOLLOW-UP APPOINTMENTS:  If you do not already have a post-op appointment, please call the office for an appointment to be seen by your surgeon.  Guidelines for how soon to be seen are listed in your After Visit Summary, but are typically between 1-4 weeks after surgery.  OTHER INSTRUCTIONS:   Knee Replacement:  Do not place pillow under knee, focus on keeping the knee straight  while resting. CPM instructions: 0-90 degrees, 2 hours in the morning, 2 hours in the afternoon, and 2 hours in the evening. Place foam block, curve side up under heel at all times except when in CPM or when walking.  DO NOT modify, tear, cut, or change the foam block in any way.  MAKE SURE YOU:   Understand these instructions.   Get help right away if you are not doing well or get worse.    Thank you for letting us be a part of your medical care team.  It is a privilege we respect greatly.  We hope these instructions will help you stay on track for a fast and full recovery!   Post Anesthesia Home Care Instructions  Activity: Get plenty of rest for the remainder of the day. A responsible adult should stay with you for 24 hours following the procedure.  For the next 24 hours, DO NOT: -Drive a car -Advertising copywriter -Drink alcoholic beverages -Take any medication unless instructed by your physician -Make any legal decisions or sign important papers.  Meals: Start with liquid foods such as gelatin or soup. Progress to regular foods as tolerated. Avoid greasy, spicy, heavy foods. If nausea and/or vomiting occur, drink only clear liquids until the nausea and/or vomiting subsides. Call your physician if vomiting continues.  Special Instructions/Symptoms: Your throat may feel dry or sore from the anesthesia or the breathing tube placed in your throat during surgery. If this causes discomfort, gargle with warm salt water. The discomfort should disappear within 24 hours.  If you had a scopolamine patch placed behind your ear for the management of post- operative nausea and/or vomiting:  1. The medication in the patch is effective for 72 hours, after which it should be removed.  Wrap patch in a tissue and discard in the trash. Wash hands thoroughly with soap and water. 2. You may remove the patch earlier than 72 hours if you experience unpleasant side effects which may include dry mouth,  dizziness or visual disturbances. 3. Avoid touching the  patch. Wash your hands with soap and water after contact with the patch.

## 2015-07-22 NOTE — Anesthesia Procedure Notes (Addendum)
Anesthesia Regional Block:  Adductor canal block  Pre-Anesthetic Checklist: ,, timeout performed, Correct Patient, Correct Site, Correct Laterality, Correct Procedure, Correct Position, site marked, Risks and benefits discussed,  Surgical consent,  Pre-op evaluation,  At surgeon's request and post-op pain management  Laterality: Left  Prep: chloraprep       Needles:  Injection technique: Single-shot  Needle Type: Echogenic Needle     Needle Length: 9cm 9 cm Needle Gauge: 21 and 21 G    Additional Needles:  Procedures: ultrasound guided (picture in chart) Adductor canal block Narrative:  Start time: 07/22/2015 1:50 PM End time: 07/22/2015 1:58 PM Injection made incrementally with aspirations every 5 mL.  Performed by: Personally  Anesthesiologist: Marcene Duos  Additional Notes: Risks and benefits discussed. Pt tolerated well with no immediate complications.   Procedure Name: LMA Insertion Date/Time: 07/22/2015 3:01 PM Performed by: Genevieve Norlander L Pre-anesthesia Checklist: Patient identified, Emergency Drugs available, Suction available, Patient being monitored and Timeout performed Patient Re-evaluated:Patient Re-evaluated prior to inductionOxygen Delivery Method: Circle System Utilized Preoxygenation: Pre-oxygenation with 100% oxygen Intubation Type: IV induction Ventilation: Mask ventilation without difficulty LMA: LMA inserted LMA Size: 5.0 Number of attempts: 1 Airway Equipment and Method: Bite block Placement Confirmation: positive ETCO2 Tube secured with: Tape Dental Injury: Teeth and Oropharynx as per pre-operative assessment

## 2015-07-22 NOTE — Transfer of Care (Signed)
Immediate Anesthesia Transfer of Care Note  Patient: Jeffery Houston  Procedure(s) Performed: Procedure(s): LEF PARTIAL KNEE REPLACEMENT  (Left)  Patient Location: PACU  Anesthesia Type:GA combined with regional for post-op pain  Level of Consciousness: awake, alert  and oriented  Airway & Oxygen Therapy: Patient Spontanous Breathing and Patient connected to face mask oxygen  Post-op Assessment: Report given to RN and Post -op Vital signs reviewed and stable  Post vital signs: Reviewed and stable  Last Vitals:  Filed Vitals:   07/22/15 1400  BP: 134/67  Pulse: 80  Temp:   Resp: 15    Complications: No apparent anesthesia complications

## 2015-07-22 NOTE — Progress Notes (Signed)
Assisted Dr. Rob Fitzgerald with left, ultrasound guided, adductor canal block. Side rails up, monitors on throughout procedure. See vital signs in flow sheet. Tolerated Procedure well.  

## 2015-07-22 NOTE — Interval H&P Note (Signed)
History and Physical Interval Note:  07/22/2015 8:45 AM  Jeffery Houston  has presented today for surgery, with the diagnosis of UNLATERIAL PRIMARY OSTEOARTHRITIS LEFT KNEE   The various methods of treatment have been discussed with the patient and family. After consideration of risks, benefits and other options for treatment, the patient has consented to  Procedure(s): LEF PARTIAL KNEE REPLACEMENT  (Left) as a surgical intervention .  The patient's history has been reviewed, patient examined, no change in status, stable for surgery.  I have reviewed the patient's chart and labs.  Questions were answered to the patient's satisfaction.     Fermin Yan D

## 2015-07-22 NOTE — Anesthesia Postprocedure Evaluation (Signed)
  Anesthesia Post-op Note  Patient: Jeffery Houston  Procedure(s) Performed: Procedure(s): LEF PARTIAL KNEE REPLACEMENT  (Left)  Patient Location: PACU  Anesthesia Type:GA combined with regional for post-op pain  Level of Consciousness: awake and alert   Airway and Oxygen Therapy: Patient Spontanous Breathing  Post-op Pain: moderate  Post-op Assessment: Post-op Vital signs reviewed LLE Motor Response: Purposeful movement LLE Sensation: Numbness          Post-op Vital Signs: Reviewed  Last Vitals:  Filed Vitals:   07/22/15 1730  BP: 150/95  Pulse: 89  Temp:   Resp: 26    Complications: No apparent anesthesia complications

## 2015-07-22 NOTE — Op Note (Signed)
07/22/2015  4:23 PM  PATIENT:  Jeffery Houston    PRE-OPERATIVE DIAGNOSIS:  UNLATERIAL PRIMARY OSTEOARTHRITIS LEFT KNEE   POST-OPERATIVE DIAGNOSIS:  Same  PROCEDURE:  LEF PARTIAL KNEE REPLACEMENT   SURGEON:  Corneshia Hines, Jewel Baize, MD  PHYSICIAN ASSISTANT: Janalee Dane, PA-C, She was present and scrubbed throughout the case, critical for completion in a timely fashion, and for retraction, instrumentation, and closure.   ANESTHESIA:   General  PREOPERATIVE INDICATIONS:  EUGUNE Houston is a  44 y.o. male with a diagnosis of UNLATERIAL PRIMARY OSTEOARTHRITIS LEFT KNEE  who failed conservative measures and elected for surgical management.    The risks benefits and alternatives were discussed with the patient preoperatively including but not limited to the risks of infection, bleeding, nerve injury, cardiopulmonary complications, blood clots, the need for revision surgery, among others, and the patient was willing to proceed.  OPERATIVE IMPLANTS: Biomet [place] mobile bearing medial compartment arthroplasty. Femoral Component: large. Tibial tray: D, Size 3 poly.   OPERATIVE FINDINGS: Endstage grade 4 medial compartment osteoarthritis. No significant changes in the lateral or patellofemoral joint  OPERATIVE PROCEDURE: The patient was brought to the operating room placed in supine position. General anesthesia was administered. IV antibiotics were given. The lower extremity was placed in the legholder and prepped and draped in usual sterile fashion.  Time out was performed.  The leg was elevated and exsanguinated and the tourniquet was inflated. Anteromedial incision was performed, and I took care to preserve the MCL. Parapatellar incision was carried out, and the osteophytes were excised, along with the medial meniscus and a small portion of the fat pad.  The extra medullary tibial cutting jig was applied, using the spoon and the 4mm G-Clamp, and I took care to protect the anterior cruciate ligament  insertion and the tibial spine. The medial collateral ligament was also protected, and I resected my proximal tibia, matching the anatomic slope.   The proximal tibial bony cut was removed in one piece, and I turned my attention to the femur.  The intramedullary femoral rod was placed using the drill, and then using the appropriate reference, I assembled the femoral jig, setting my posterior cutting block. I resected my posterior femur, and then measured my gap.   I then used the mill to match the extension gap to the flexion gap. The gaps were then measured again with the appropriate feeler gauges. Once I had balanced flexion and extension gaps, I then completed the preparation of the femur.  I milled off the anterior aspect of the distal femur to prevent impingement. I also exposed the tibia, and selected the above-named component, and then used the cutting jig to prepare the keel slot on the tibia. I also used the awl to curette out the bone to complete the preparation of the keel. The back wall was intact.  I then placed trial components, and it was found to have excellent motion, and appropriate balance.  I then cemented the components into place, cementing the tibia first, removing all excess cement, and then cementing the femur.  All loose cement was removed.  The real polyethylene insert was applied manually, and the knee was taken through functional range of motion, and found to have excellent stability and restoration of joint motion, with excellent balance.  The wounds were irrigated copiously, and the parapatellar tissue closed with Vicryl, followed by Vicryl for the subcutaneous tissue, with routine closure with Steri-Strips and sterile gauze.  The tourniquet was released, and the patient  was awakened and extubated and returned to PACU in stable and satisfactory condition. There were no complications.  POSTOPERATIVE PLAN: DVT px will consist of SCD's and ASA 325,  WBAT     Sheral Apley, MD  This note was generated using a template and dragon dictation system. In light of that, I have reviewed the note and all aspects of it are applicable to this case. Any dictation errors are due to the computerized dictation system.

## 2015-07-22 NOTE — Anesthesia Preprocedure Evaluation (Addendum)
Anesthesia Evaluation  Patient identified by MRN, date of birth, ID band Patient awake    Reviewed: Allergy & Precautions, NPO status , Patient's Chart, lab work & pertinent test results  Airway Mallampati: II  TM Distance: >3 FB Neck ROM: Full    Dental  (+) Dental Advisory Given, Teeth Intact   Pulmonary former smoker,  breath sounds clear to auscultation        Cardiovascular hypertension, Pt. on medications Rhythm:Regular Rate:Normal     Neuro/Psych negative neurological ROS     GI/Hepatic negative GI ROS, Neg liver ROS,   Endo/Other  diabetes, Type 2, Oral Hypoglycemic AgentsMorbid obesity  Renal/GU negative Renal ROS     Musculoskeletal  (+) Arthritis -,   Abdominal   Peds  Hematology negative hematology ROS (+)   Anesthesia Other Findings   Reproductive/Obstetrics                            Anesthesia Physical Anesthesia Plan  ASA: II  Anesthesia Plan: General and Regional   Post-op Pain Management: GA combined w/ Regional for post-op pain   Induction: Intravenous  Airway Management Planned: LMA  Additional Equipment:   Intra-op Plan:   Post-operative Plan:   Informed Consent: I have reviewed the patients History and Physical, chart, labs and discussed the procedure including the risks, benefits and alternatives for the proposed anesthesia with the patient or authorized representative who has indicated his/her understanding and acceptance.   Dental advisory given  Plan Discussed with: CRNA  Anesthesia Plan Comments:         Anesthesia Quick Evaluation

## 2015-07-23 DIAGNOSIS — M1712 Unilateral primary osteoarthritis, left knee: Secondary | ICD-10-CM | POA: Diagnosis not present

## 2015-07-24 NOTE — Progress Notes (Signed)
Call received from [employer] rep, Amy, HHPT preoperative arranged from surgeon's office. No orders for [facility] PT. Orders added per orthopedic protocol. Office will follow up with Dr. Eulah Pont. Isidoro Donning RN CCM Case Mgmt phone [phone]

## 2015-07-26 ENCOUNTER — Encounter (HOSPITAL_BASED_OUTPATIENT_CLINIC_OR_DEPARTMENT_OTHER): Payer: Self-pay | Admitting: Orthopedic Surgery

## 2015-08-09 ENCOUNTER — Ambulatory Visit: Payer: Medicaid Other | Attending: Orthopedic Surgery | Admitting: Physical Therapy

## 2015-08-09 DIAGNOSIS — R269 Unspecified abnormalities of gait and mobility: Secondary | ICD-10-CM | POA: Diagnosis present

## 2015-08-09 DIAGNOSIS — M25462 Effusion, left knee: Secondary | ICD-10-CM | POA: Insufficient documentation

## 2015-08-09 DIAGNOSIS — R29898 Other symptoms and signs involving the musculoskeletal system: Secondary | ICD-10-CM | POA: Insufficient documentation

## 2015-08-09 DIAGNOSIS — M25562 Pain in left knee: Secondary | ICD-10-CM | POA: Diagnosis not present

## 2015-08-09 DIAGNOSIS — M25662 Stiffness of left knee, not elsewhere classified: Secondary | ICD-10-CM

## 2015-08-09 NOTE — Patient Instructions (Signed)
   Kristoffer Leamon PT, DPT, LAT, ATC  [facility] Phone: [phone]     

## 2015-08-09 NOTE — Therapy (Addendum)
[place], [place], [address] Phone: [phone]   Fax:  [phone]  Physical Therapy Evaluation  Patient Details  Name: Jeffery Houston MRN: [id number] Date of Birth: 10/07/1971 Referring Provider:  Renette Butters, MD  Encounter Date: 08/09/2015      PT End of Session - 08/09/15 1253    Visit Number 1   Number of Visits 4   Date for PT Re-Evaluation 10/04/15   Authorization Type Medicaid   PT Start Time 1884   PT Stop Time 1230   PT Time Calculation (min) 45 min   Activity Tolerance Patient limited by pain   Behavior During Therapy [facility] for tasks assessed/performed      Past Medical History  Diagnosis Date  . Hypertension   . Diabetes mellitus without complication   . Arthritis     bil knees    Past Surgical History  Procedure Laterality Date  . Foot surgery    . Partial knee arthroplasty Left 07/22/2015    Procedure: LEF PARTIAL KNEE REPLACEMENT ;  Surgeon: Renette Butters, MD;  Location: [place];  Service: Orthopedics;  Laterality: Left;    There were no vitals filed for this visit.  Visit Diagnosis:  Left knee pain - Plan: PT plan of care cert/re-cert  Knee swelling, left - Plan: PT plan of care cert/re-cert  Decreased ROM of left knee - Plan: PT plan of care cert/re-cert  Abnormality of gait - Plan: PT plan of care cert/re-cert  Weakness of left leg - Plan: PT plan of care cert/re-cert      Subjective Assessment - 08/09/15 1159    Subjective pt is a 44 y.o m s/p L uniathroplasty on 07/22/2015. since the surgery he reports stiffness, and soreness and intemermitent swelling.    Limitations Standing;Walking   How long can you sit comfortably? 10 min   How long can you stand comfortably? 2 hours   How long can you walk comfortably? 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 Yes   Pain Score 5   took pain medication at 7am   Pain Location Knee   Pain Orientation Left   Pain Descriptors / Indicators Sharp;Aching   Pain Type Surgical pain   Pain Onset 1 to 4 weeks ago   Pain Frequency Constant   Aggravating Factors  prolong standing, twisting the wrong, stairs/ bending the knee   Pain Relieving Factors pain medication ice            [facility] PT Assessment - 08/09/15 1206    Assessment   Medical Diagnosis L knee uniarthroplasty   Onset Date/Surgical Date 07/22/15   Hand Dominance Right   Next MD Visit 09/02/2015   Prior Therapy no   Precautions   Precautions None   Restrictions   Weight Bearing Restrictions No   Balance Screen   Has the patient fallen in the past 6 months No   Has the patient had a decrease in activity level because of a fear of falling?  No   Is the patient reluctant to leave their home because of a fear of falling?  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 standing, walking, kneeling, squatting, crawling   Leisure cooking,   Cognition   Overall Cognitive Status Within Functional Limits for tasks assessed   Observation/Other Assessments   Observations incision site appears clean and healing well   Focus on Therapeutic Outcomes (FOTO)  42% limited  predicted 38%   Observation/Other Assessments-Edema    Edema Circumferential   Circumferential Edema   Circumferential - Left  @  joint line 46.5 cm, 10 cm above 56 cm, 10 cm below 46.5 cm.    Posture/Postural Control   Posture/Postural Control Postural limitations   Postural Limitations Rounded Shoulders;Forward head   ROM / Strength   AROM / PROM / Strength AROM;PROM;Strength   AROM   AROM Assessment Site Knee   Right/Left Knee Right;Left   Right Knee Extension 0   Right Knee Flexion 130   Left Knee Extension -15   Left Knee Flexion 107   PROM   PROM Assessment Site Knee   Right/Left Knee Right;Left   Left Knee Extension -10   Left Knee Flexion 110   Strength   Strength Assessment Site Knee;Hip   Right/Left Hip Right;Left   Right Hip Flexion 4+/5   Right Hip Extension 4+/5   Right Hip ABduction 4+/5   Right Hip ADduction 4+/5   Left Hip Flexion 4-/5   Left Hip Extension 4/5   Left Hip ABduction 4-/5   Left Hip ADduction 4/5   Right/Left Knee Right;Left   Right Knee Flexion 5/5   Right Knee Extension 5/5   Left Knee Flexion 4/5   Left Knee Extension 4/5   Palpation   Palpation comment tenderness located on peripatellar with increased sorness in the lateral aspect   Ambulation/Gait   Gait Pattern Wide base of support;Antalgic;Poor foot clearance - left;Decreased stride length  bil toe out                           PT Education - 08/09/15 1252    Education provided Yes   Education Details evaluation findings, POC, Goals, HEP   Person(s) Educated Patient   Methods Explanation   Comprehension Verbalized understanding          PT Short Term Goals - 08/09/15 1300    PT SHORT TERM GOAL #1   Title pt will be I with basic HEP (09/08/2015)   Baseline no previous HEP   Time 4   Period Weeks   Status New   PT SHORT TERM GOAL #2   Title pt will increase L knee flexion to > 115 degrees AROM with < 2/10 pain  to assist with funciton progression  (09/08/2015)   Baseline L knee flexion 107   Time 4   Period Weeks   Status New   PT SHORT TERM GOAL #3   Title pt will be able  to stand / walk for > 1 hour with <4/10 pain to help with endurance (09/08/2015)   Baseline able to stand for 2 hours with 8/10 pain   Time 4   Period Weeks   Status New  PT SHORT TERM GOAL #4   Title pt will be able to verbalize and dmeonstrate techniques to control L knee pain, swelling and inflammation  (09/08/2015)   Baseline no knowledge on how to control pain and inflammation   Time 4   Period Weeks   Status New           PT Long Term Goals - 08/09/15 1303    PT LONG TERM GOAL #1   Title pt will be I with all HEP given throughout therapy  (10/04/2015)   Baseline no previous HEP    Time 8   Period Weeks   Status New   PT LONG TERM GOAL #2   Title pt will increaes L knee flexion to >120 and extension to 0 to assist with functional and efficient gait pattern (10/04/2015)   Baseline L knee 107 degrees flexion, -15 degrees extension   Time 8   Period Weeks   Status New   PT LONG TERM GOAL #3   Title pt will demonstrate left knee strength to <4/5 with < 2/10 pain to assist with walking/standing endurance (10/04/2015)   Baseline Left knee strength 4-/5   Time 8   Period Weeks   Status New   PT LONG TERM GOAL #4   Title pt will be able to kneel to the floor and get back up with < 2/10 pain to assist with work related activities (10/04/2015)   Baseline difficulty bending the knee and squating   Time 8   Period Weeks   Status New   PT LONG TERM GOAL #5   Title pt will increase his FOTO score to > a 62 to demonstrate improved function at discharge (10/04/2015)   Baseline intial score 58    Time 8   Period Weeks   Status New               Plan - 08/09/15 1253    Clinical Impression Statement Neev presents to OPPT s/p L knee medial uni-arthroplasty. He demonstrates limited AROM/ PROM compared bil due to pain and tightness. He exhibits swelling in the knee and inferior to the knee. He demonstrates weakness in the R knee/ hip musculature. He currnetly ambulates with  an antalgic, toe out  gait pattern with no AD. pt would benefit from physical therapy to maxmize his funciton and decrease pain by adressing the impairments listed.   unilateral arthroplasty [id number]   Pt will benefit from skilled therapeutic intervention in order to improve on the following deficits Abnormal gait;Decreased activity tolerance;Decreased balance;Decreased endurance;Impaired tone;Pain;Improper body mechanics;Postural dysfunction;Impaired flexibility;Decreased strength;Increased edema;Decreased mobility;Decreased range of motion;Hypomobility;Decreased scar mobility;Increased muscle spasms   Rehab Potential Good   PT Frequency 1x / week   PT Duration --  every other week   PT Treatment/Interventions ADLs/Self Care Home Management;Cryotherapy;Electrical Stimulation;Iontophoresis 4mg /ml Dexamethasone;Moist Heat;Ultrasound;Gait training;Functional mobility training;Therapeutic activities;Therapeutic exercise;Balance training;Patient/family education;Manual techniques;Taping;Dry needling;Passive range of motion;Scar mobilization;Vasopneumatic Device   PT Next Visit Plan assess response to HEP, knee mobs, hip/knee strengthening, modalities PRN   PT Home Exercise Plan see HEP   Consulted and Agree with Plan of Care Patient         Problem List Patient Active Problem List   Diagnosis Date Noted  . S/P left unicompartmental knee replacement 07/22/2015   Starr Lake PT, DPT, LAT, ATC  08/09/2015  1:18 PM      [facility] [address] [place], [place], [address] Phone: [phone]   Fax:  [phone]  PHYSICAL THERAPY DISCHARGE SUMMARY  Visits from Start of Care: 1  Current functional level related to goals / functional outcomes: FOTO 42% limited   Remaining deficits: See goals   Education / Equipment: HEP  Plan:                                                    Patient goals were not met. Patient is being  discharged due to not returning since the last visit.  ?????        Kristoffer Leamon PT, DPT, LAT, ATC  09/06/2015  12:33 PM

## 2015-08-22 ENCOUNTER — Ambulatory Visit: Payer: Medicaid Other

## 2015-09-16 ENCOUNTER — Ambulatory Visit: Payer: Medicaid Other | Attending: Orthopedic Surgery | Admitting: Physical Therapy

## 2015-09-16 DIAGNOSIS — R29898 Other symptoms and signs involving the musculoskeletal system: Secondary | ICD-10-CM | POA: Diagnosis present

## 2015-09-16 DIAGNOSIS — M25462 Effusion, left knee: Secondary | ICD-10-CM | POA: Diagnosis present

## 2015-09-16 DIAGNOSIS — M25562 Pain in left knee: Secondary | ICD-10-CM | POA: Diagnosis not present

## 2015-09-16 DIAGNOSIS — M25662 Stiffness of left knee, not elsewhere classified: Secondary | ICD-10-CM

## 2015-09-16 DIAGNOSIS — R269 Unspecified abnormalities of gait and mobility: Secondary | ICD-10-CM | POA: Insufficient documentation

## 2015-09-16 NOTE — Therapy (Signed)
[place], [place], [address] Phone: [phone]   Fax:  [phone]  Physical Therapy Treatment  Patient Details  Name: Jeffery Houston MRN: [id number] Date of Birth: Nov 24, 1970 No Data Recorded  Encounter Date: 09/16/2015      PT End of Session - 09/16/15 1056    Visit Number 2   Number of Visits 4   Date for PT Re-Evaluation 09/25/15   PT Start Time 1019   PT Stop Time 1105   PT Time Calculation (min) 46 min   Activity Tolerance Patient tolerated treatment well   Behavior During Therapy [facility] for tasks assessed/performed      Past Medical History  Diagnosis Date  . Hypertension   . Diabetes mellitus without complication   . Arthritis     bil knees    Past Surgical History  Procedure Laterality Date  . Foot surgery    . Partial knee arthroplasty Left 07/22/2015    Procedure: LEF PARTIAL KNEE REPLACEMENT ;  Surgeon: Jeffery Butters, MD;  Location: [place];  Service: Orthopedics;  Laterality: Left;    There were no vitals filed for this visit.  Visit Diagnosis:  Left knee pain  Knee swelling, left  Decreased ROM of left knee  Abnormality of gait  Weakness of left leg          [facility] PT Assessment - 09/16/15 1029    AROM   Left Knee Extension -10   Left Knee Flexion 115  supine 112 AROM with AAROM 117   Strength   Left Knee Flexion 5/5   Left Knee Extension 5/5                     [facility] Adult PT Treatment/Exercise - 09/16/15 1024    Knee/Hip Exercises: Aerobic   Stationary Bike level 1, 6 min for ROM and warm up   Knee/Hip Exercises: Standing   Hip Extension Stengthening;Both;1 set;10 reps;Knee straight   Functional Squat 1 set;10 reps   Functional Squat Limitations mod cues for hips    Wall Squat 1 set;10 reps   SLS with Vectors hip abduction x 10 each LE    Knee/Hip Exercises: Supine   Quad Sets Left;1 set;10 reps   Quad Sets Limitations  HEP   Heel Slides AAROM;Left;1 set;5 sets   Heel Slides Limitations 20 sec hold    Straight Leg Raises Strengthening;Left;2 sets;10 reps   Straight Leg Raise with External Rotation Strengthening;Left;1 set;10 reps   Knee Flexion AAROM   Knee Flexion Limitations 122 deg   Cryotherapy   Number Minutes Cryotherapy 10 Minutes   Cryotherapy Location Knee   Type of Cryotherapy Ice pack   Manual Therapy   Manual Therapy Joint mobilization   Manual therapy comments patella mobs   Joint Mobilization Ext Gr 2-3 and flexion gr 2, well tolerated                PT Education - 09/16/15 1056    Education provided Yes   Education Details HEP structured, pronlonged stretch to gain ROM   Person(s) Educated Patient   Methods Explanation;Demonstration;Handout   Comprehension Verbalized understanding          PT Short Term Goals - 09/16/15 1020    PT SHORT TERM GOAL #1   Title pt will be I with basic HEP (09/08/2015)   Status On-going   PT SHORT TERM GOAL #2   Title pt will increase  L knee flexion to > 115 degrees AROM with < 2/10 pain  to assist with funciton progression  (09/08/2015)   Status On-going   PT SHORT TERM GOAL #3   Title pt will be able to stand / walk for > 1 hour with <4/10 pain to help with endurance (09/08/2015)   Baseline was up to 3 hours the other day and pain was 10/10   Status Partially Met   PT SHORT TERM GOAL #4   Title pt will be able to verbalize and dmeonstrate techniques to control L knee pain, swelling and inflammation  (09/08/2015)   Status On-going           PT Long Term Goals - 09/16/15 1021    PT LONG TERM GOAL #1   Title pt will be I with all HEP given throughout therapy  (10/04/2015)   Status On-going   PT LONG TERM GOAL #2   Title pt will increaes L knee flexion to >120 and extension to 0 to assist with functional and efficient gait pattern (10/04/2015)   Status On-going   PT LONG TERM GOAL #3   Title pt will demonstrate left knee  strength to <4/5 with < 2/10 pain to assist with walking/standing endurance (10/04/2015)   Status On-going   PT LONG TERM GOAL #4   Title pt will be able to kneel to the floor and get back up with < 2/10 pain to assist with work related activities (10/04/2015)   Baseline squats, kneeling on Rt. knee only.    Status On-going   PT LONG TERM GOAL #5   Title pt will increase his FOTO score to > a 62 to demonstrate improved function at discharge (10/04/2015)   Status On-going               Plan - 09/16/15 1057    Clinical Impression Statement Patient doing well, able to increase ROM to 122 with AAROM.  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  Pain increases to severe after he has been up for over 1-2 hours.     PT Next Visit Plan cont mobs, progress ROm and strength in closed chain, stability   PT Home Exercise Plan see HEP, reissued today   Consulted and Agree with Plan of Care Patient        Problem List Patient Active Problem List   Diagnosis Date Noted  . S/P left unicompartmental knee replacement 07/22/2015    Jeffery Houston 09/16/2015, 11:00 AM  [place], [place], [address] Phone: [phone]   Fax:  [phone]  Name: Jeffery Houston MRN: [id number] Date of Birth: 10-23-1971  Jeffery Houston, PT 09/16/2015 11:00 AM Phone: [phone] Fax: [phone]

## 2015-09-20 ENCOUNTER — Ambulatory Visit: Payer: Medicaid Other | Attending: Orthopedic Surgery | Admitting: Physical Therapy

## 2015-09-20 DIAGNOSIS — M25562 Pain in left knee: Secondary | ICD-10-CM | POA: Diagnosis not present

## 2015-09-20 DIAGNOSIS — M25462 Effusion, left knee: Secondary | ICD-10-CM | POA: Diagnosis present

## 2015-09-20 DIAGNOSIS — R269 Unspecified abnormalities of gait and mobility: Secondary | ICD-10-CM | POA: Diagnosis present

## 2015-09-20 DIAGNOSIS — R29898 Other symptoms and signs involving the musculoskeletal system: Secondary | ICD-10-CM | POA: Insufficient documentation

## 2015-09-20 DIAGNOSIS — M25662 Stiffness of left knee, not elsewhere classified: Secondary | ICD-10-CM

## 2015-09-20 NOTE — Therapy (Signed)
[facility] [address] [place], [place], [address] Phone: [phone]   Fax:  [phone]  Physical Therapy Treatment  Patient Details  Name: Jeffery DamesJohn R Wymore MRN: [id number] Date of Birth: 1971/05/05 No Data Recorded  Encounter Date: 09/20/2015      PT End of Session - 09/20/15 1607    Visit Number 3   Number of Visits 4   Date for PT Re-Evaluation 09/25/15   PT Start Time 1547   PT Stop Time 1630   PT Time Calculation (min) 43 min   Activity Tolerance Patient tolerated treatment well   Behavior [facility] for tasks assessed/performed      Past Medical History  Diagnosis Date  . Hypertension   . Diabetes mellitus without complication   . Arthritis     bil knees    Past Surgical History  Procedure Laterality Date  . Foot surgery    . Partial knee arthroplasty Left 07/22/2015    Procedure: LEF PARTIAL KNEE REPLACEMENT ;  Surgeon: Sheral Apleyimothy D Murphy, MD;  Location: [facility];  Service: Orthopedics;  Laterality: Left;    There were no vitals filed for this visit.  Visit Diagnosis:  Left knee pain  Knee swelling, left  Decreased ROM of left knee  Abnormality of gait  Weakness of left leg      Subjective Assessment - 09/20/15 1623    Subjective Pt has not been doing exercises but has been walking alot and helping a friend work on cars. States he has no pain with walking but after a long time on his feet, his pain begins when he sits.  He will be seeing Dr. Eulah PontMurphy for Rt. knee surgery.    Currently in Pain? No/denies  Pain was severe after Sat/Sun   Pain Location Knee   Pain Orientation Left   Pain Type Surgical pain   Pain Onset More than a month ago           [facility] Adult PT Treatment/Exercise - 09/20/15 1559    Knee/Hip Exercises: Stretches   Active Hamstring Stretch Left;2 reps;30 seconds   Knee: Self-Stretch to increase Flexion 3 reps   Knee/Hip Exercises: Aerobic   Nustep  level 6 LE only strength and warm up    Knee/Hip Exercises: Machines for Strengthening   Cybex Leg Press 1 plate x 20 bilat and x 10 LLE only    Other Machine calf raise x 10 1 plate with stretch    Knee/Hip Exercises: Standing   Other Standing Knee Exercises calf stretch    Knee/Hip Exercises: Supine   Bridges Limitations x 10   2 sets   Single Leg Bridge Strengthening;Both;1 set;10 reps   Straight Leg Raises Strengthening;Left;1 set;10 reps   Straight Leg Raise with External Rotation Strengthening;Left;1 set;10 reps   Knee Flexion Limitations 119   Other Supine Knee/Hip Exercises hamstring curl with ball x 10    Knee/Hip Exercises: Prone   Hip Extension Strengthening;Left;1 set;10 reps   Other Prone Exercises quad set x 10    Other Prone Exercises knee flexion x 5 with contract relax and sheet   Cryotherapy   Number Minutes Cryotherapy 10 Minutes   Cryotherapy Location Knee   Type of Cryotherapy Ice pack       Declines ICE         PT Education - 09/20/15 1628    Education provided Yes   Education Details HEP and importance of HEP vs just walking and  squatting, using good form   Person(s) Educated Patient   Methods Explanation   Comprehension Verbalized understanding          PT Short Term Goals - 09/20/15 1609    PT SHORT TERM GOAL #1   Title pt will be I with basic HEP (09/08/2015)   Status On-going   PT SHORT TERM GOAL #2   Title pt will increase L knee flexion to > 115 degrees AROM with < 2/10 pain  to assist with funciton progression  (09/08/2015)   Baseline 119, pain 10/10   Status On-going   PT SHORT TERM GOAL #3   Title pt will be able to stand / walk for > 1 hour with <4/10 pain to help with endurance (09/08/2015)   Baseline pain only with bending   Status Achieved   PT SHORT TERM GOAL #4   Title pt will be able to verbalize and dmeonstrate techniques to control L knee pain, swelling and inflammation  (09/08/2015)   Status Achieved            PT Long Term Goals - 09/20/15 1611    PT LONG TERM GOAL #1   Title pt will be I with all HEP given throughout therapy  (10/04/2015)   Status On-going   PT LONG TERM GOAL #2   Title pt will increaes L knee flexion to >120 and extension to 0 to assist with functional and efficient gait pattern (10/04/2015)   Baseline lacks full ext   Status On-going   PT LONG TERM GOAL #3   Title pt will demonstrate left knee strength to <4/5 with < 2/10 pain to assist with walking/standing endurance (10/04/2015)   Status Achieved   PT LONG TERM GOAL #4   Title pt will be able to kneel to the floor and get back up with < 2/10 pain to assist with work related activities (10/04/2015)   Status On-going   PT LONG TERM GOAL #5   Title pt will increase his FOTO score to > a 62 to demonstrate improved function at discharge (10/04/2015)   Status Unable to assess               Plan - 09/20/15 1608    Clinical Impression Statement Patient has not done HEP but has been active at work, walking and squatting.  He cont with pain, weakness in hips, poor endurance and swelling in LLE. HAs 1 more visit and will be DC.  Not interested in gym membership.    PT Next Visit Plan cont mobs, progress ROm and strength in closed chain, stability   PT Home Exercise Plan see HEP, reissued today   Consulted and Agree with Plan of Care Patient        Problem List Patient Active Problem List   Diagnosis Date Noted  . S/P left unicompartmental knee replacement 07/22/2015    PAA,JENNIFER 09/20/2015, 4:30 PM  [facility] [address] [place], [place], [address] Phone: [phone]   Fax:  [phone]  Name: CHURCHILL GRIMSLEY MRN: [id number] Date of Birth: [birth year]  Karie Mainland, PT 09/20/2015 4:30 PM Phone: [phone] Fax: [phone]

## 2015-09-23 ENCOUNTER — Ambulatory Visit: Payer: Medicaid Other | Admitting: Physical Therapy

## 2015-09-23 DIAGNOSIS — M25462 Effusion, left knee: Secondary | ICD-10-CM

## 2015-09-23 DIAGNOSIS — M25662 Stiffness of left knee, not elsewhere classified: Secondary | ICD-10-CM

## 2015-09-23 DIAGNOSIS — M25562 Pain in left knee: Secondary | ICD-10-CM | POA: Diagnosis not present

## 2015-09-23 DIAGNOSIS — R29898 Other symptoms and signs involving the musculoskeletal system: Secondary | ICD-10-CM

## 2015-09-23 DIAGNOSIS — R269 Unspecified abnormalities of gait and mobility: Secondary | ICD-10-CM

## 2015-09-23 NOTE — Therapy (Signed)
[place], [place], [address] Phone: [phone]   Fax:  [phone]  Physical Therapy Treatment/Discharge Summary  Patient Details  Name: Jeffery Houston MRN: [id number] Date of Birth: [birth year] No Data Recorded  Encounter Date: 09/23/2015      PT End of Session - 09/23/15 0738    Visit Number 4   Number of Visits 4   Date for PT Re-Evaluation 09/25/15   Authorization Type Medicaid   PT Start Time 0700   PT Stop Time 8099   PT Time Calculation (min) 34 min   Activity Tolerance Patient tolerated treatment well      Past Medical History  Diagnosis Date  . Hypertension   . Diabetes mellitus without complication   . Arthritis     bil knees    Past Surgical History  Procedure Laterality Date  . Foot surgery    . Partial knee arthroplasty Left 07/22/2015    Procedure: LEF PARTIAL KNEE REPLACEMENT ;  Surgeon: Renette Butters, MD;  Location: [place];  Service: Orthopedics;  Laterality: Left;    There were no vitals filed for this visit.  Visit Diagnosis:  Left knee pain  Knee swelling, left  Decreased ROM of left knee  Abnormality of gait  Weakness of left leg      Subjective Assessment - 09/23/15 0700    Subjective Patient presents without assistive device.  I was up on it for 3 hours one time.  Avoids steps because of non-surgical knee.  Initial AM stiffness and pain 5/10 but better now.  No longer needs pain medication.  Takes Tramadol rarely.    Currently in Pain? No/denies   Pain Score 0-No pain   Pain Location Knee   Pain Orientation Left   Pain Type Surgical pain   Pain Onset More than a month ago   Pain Frequency Intermittent   Aggravating Factors  stairs            [facility] PT Assessment - 09/23/15 0705    Observation/Other Assessments   Focus on Therapeutic Outcomes (FOTO)  53/80  LEFS   AROM   Left Knee Extension -2   Left Knee Flexion 115    Strength   Left Hip ABduction 4+/5   Left Knee Flexion 5/5   Left Knee Extension 4+/5                     [facility] Adult PT Treatment/Exercise - 09/23/15 0706    Knee/Hip Exercises: Aerobic   Nustep Nu-Step L4 LEs only   Knee/Hip Exercises: Standing   Lateral Step Up Left;1 set;10 reps;Hand Hold: 1;Step Height: 6"   Forward Step Up Left;1 set;10 reps;Hand Hold: 1;Step Height: 6"   Step Down Left;1 set;10 reps;Hand Hold: 1;Step Height: 4"   SLS with Vectors Standing 4 ways 15x each   Knee/Hip Exercises: Supine   Straight Leg Raises Strengthening;Left;10 reps   Knee/Hip Exercises: Sidelying   Hip ABduction AROM;Left;10 reps                PT Education - 09/23/15 0737    Education provided Yes   Education Details step exercises    Person(s) Educated Patient   Methods Explanation;Demonstration;Handout   Comprehension Verbalized understanding;Returned demonstration          PT Short Term Goals - 09/23/15 0742    PT SHORT TERM GOAL #1   Title pt will be I with basic HEP (09/08/2015)  Status Achieved   PT SHORT TERM GOAL #2   Title pt will increase L knee flexion to > 115 degrees AROM with < 2/10 pain  to assist with funciton progression  (09/08/2015)   Status Achieved   PT SHORT TERM GOAL #3   Title pt will be able to stand / walk for > 1 hour with <4/10 pain to help with endurance (09/08/2015)   Status Achieved   PT SHORT TERM GOAL #4   Title pt will be able to verbalize and dmeonstrate techniques to control L knee pain, swelling and inflammation  (09/08/2015)   Status Achieved           PT Long Term Goals - 09/23/15 0743    PT LONG TERM GOAL #1   Title pt will be I with all HEP given throughout therapy  (10/04/2015)   Status Achieved   PT LONG TERM GOAL #2   Title pt will increaes L knee flexion to >120 and extension to 0 to assist with functional and efficient gait pattern (10/04/2015)   Status Not Met   PT LONG TERM GOAL #3   Title pt will  demonstrate left knee strength to <4/5 with < 2/10 pain to assist with walking/standing endurance (10/04/2015)   Status Achieved   PT LONG TERM GOAL #4   Title pt will be able to kneel to the floor and get back up with < 2/10 pain to assist with work related activities (10/04/2015)   Status Achieved   PT LONG TERM GOAL #5   Title pt will increase his FOTO score to > a 62 to demonstrate improved function at discharge (10/04/2015)   Baseline LEFS 53/80   Status Partially Met               Plan - 09/23/15 0738    Clinical Impression Statement Patient reports he has not been doing quad specific exercises at home, just walking on hills near his house and working fixing cars.  He reports he will be having right knee surgery in a few months.  Last visit as approved by Medicaid.  Extensive education on patellofemoral alignment, quad and gluteal strengthening.  Knee AROM 2-115 degrees.  Quad and gluteal strength 4+/5.  HS 5/5. Minimal pain.  Able to stand and walk community distances.   Majority of goals met.  Discharge from PT.         [place]  Visits from Start of Care: 4  Current functional level related to goals / functional outcomes: See clinical impressions above   Remaining deficits: See above   Education / Equipment: HEP and safe self progression Plan: Patient agrees to discharge.  Patient goals were partially met. Patient is being discharged due to meeting the stated rehab goals.  ?????        Problem List Patient Active Problem List   Diagnosis Date Noted  . S/P left unicompartmental knee replacement 07/22/2015   [name], PT 09/23/2015 7:47 AM Phone: [phone] Fax: [phone] [name] 09/23/2015, 7:46 AM  [facility] [address] [place], [place], [address] Phone: [phone]   Fax:  [phone]  Name: Jeffery Houston MRN: [id number] Date of Birth:  [birth year]

## 2016-04-10 ENCOUNTER — Ambulatory Visit (HOSPITAL_COMMUNITY)
Admission: EM | Admit: 2016-04-10 | Discharge: 2016-04-10 | Disposition: A | Discharge status: Home / Self Care | Payer: Medicaid Other | Attending: Emergency Medicine | Admitting: Emergency Medicine

## 2016-04-10 ENCOUNTER — Encounter (HOSPITAL_COMMUNITY): Payer: Self-pay | Admitting: *Deleted

## 2016-04-10 DIAGNOSIS — L089 Local infection of the skin and subcutaneous tissue, unspecified: Secondary | ICD-10-CM | POA: Diagnosis not present

## 2016-04-10 MED ORDER — TRAMADOL HCL 50 MG PO TABS: 50.0000 mg | ORAL_TABLET | Freq: Four times a day (QID) | ORAL | Status: DC | PRN

## 2016-04-10 MED ORDER — IBUPROFEN 600 MG PO TABS: 600.0000 mg | ORAL_TABLET | Freq: Four times a day (QID) | ORAL | Status: DC | PRN

## 2016-04-10 MED ORDER — CEPHALEXIN 500 MG PO CAPS: 500.0000 mg | ORAL_CAPSULE | Freq: Four times a day (QID) | ORAL | Status: DC

## 2016-04-10 NOTE — ED Provider Notes (Signed)
CSN: [id number]     Arrival date & time 04/10/16  1426 History   First MD Initiated Contact with Patient 04/10/16 1451     Chief Complaint  Patient presents with  . Hand Pain   (Consider location/radiation/quality/duration/timing/severity/associated sxs/prior Treatment) HPI  He is a 45 year old man here for evaluation of right index finger pain. 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 This is been intermittent for a month, but worse last week when he reinjured it. He states the nail seems a little lifted on the ulnar aspect. He is able to flex and extend his finger. He states it throbs, particularly at night. No drainage. No fevers or chills.  Past Medical History  Diagnosis Date  . Hypertension   . Diabetes mellitus without complication (HCC)   . Arthritis     bil knees   Past Surgical History  Procedure Laterality Date  . Foot surgery    . Partial knee arthroplasty Left 07/22/2015    Procedure: LEF PARTIAL KNEE REPLACEMENT ;  Surgeon: Sheral Apleyimothy D Murphy, MD;  Location: [facility];  Service: Orthopedics;  Laterality: Left;   History reviewed. No pertinent family history. Social History  Substance Use Topics  . Smoking status: Former Smoker    Quit date: 05/14/2010  . Smokeless tobacco: Former NeurosurgeonUser  . Alcohol Use: Yes     Comment: social    Review of Systems As in history of present illness Allergies  Latex  Home Medications   Prior to Admission medications   Medication Sig Start Date End Date Taking? Authorizing Provider  amLODipine (NORVASC) 10 MG tablet Take 10 mg by mouth daily. 01/11/15   Historical Provider, MD  aspirin 325 MG tablet Take 1 tablet (325 mg total) by mouth daily. 07/22/15   Brittney Tresa EndoKelly, PA-C  cephALEXin (KEFLEX) 500 MG capsule Take 1 capsule (500 mg total) by mouth 4 (four) times daily. 04/10/16   Charm RingsErin J Kennie Snedden, MD  cyclobenzaprine (FLEXERIL) 10 MG tablet Take 1 tablet (10 mg total) by mouth 2 (two) times daily as needed for  muscle spasms. 04/01/15   Oswaldo ConroyVictoria Creech, PA-C  docusate sodium (COLACE) 100 MG capsule Take 1 capsule (100 mg total) by mouth 2 (two) times daily. 07/22/15   Brittney Tresa EndoKelly, PA-C  ibuprofen (ADVIL,MOTRIN) 600 MG tablet Take 1 tablet (600 mg total) by mouth every 6 (six) hours as needed for moderate pain. 04/10/16   Charm RingsErin J Talah Cookston, MD  lisinopril (PRINIVIL,ZESTRIL) 10 MG tablet Take 10 mg by mouth daily.    Historical Provider, MD  metFORMIN (GLUCOPHAGE) 500 MG tablet Take 500 mg by mouth 2 (two) times daily. 02/15/15   Historical Provider, MD  ondansetron (ZOFRAN) 4 MG tablet Take 1 tablet (4 mg total) by mouth every 8 (eight) hours as needed for nausea or vomiting. 07/22/15   Janalee DaneBrittney Kelly, PA-C  PRESCRIPTION MEDICATION Apply 1 application topically daily as needed (eczema).    Historical Provider, MD  traMADol (ULTRAM) 50 MG tablet Take 1 tablet (50 mg total) by mouth every 6 (six) hours as needed. 04/10/16   Charm RingsErin J Vernal Rutan, MD   Meds Ordered and Administered this Visit  Medications - No data to display  BP 147/95 mmHg  Pulse 76  Temp(Src) 98 F (36.7 C) (Oral)  Resp 16  SpO2 99% No data found.   Physical Exam  Constitutional: He is oriented to person, place, and time. He appears well-developed and well-nourished. No distress.  Cardiovascular: Normal rate.  Pulmonary/Chest: Effort normal.  Neurological: He is alert and oriented to person, place, and time.  Skin:  Right index finger: He has significant callus the distal index finger. It appears that he had some sort of nail bed injury at some point as the ulnar nail is slightly raised and appears to have a blood clot under it. This is almost grown out. No paronychia. He does have some hypopigmentation at the distal finger, unclear if this is callus or pus. He is able to fully extend his finger without difficulty.    ED Course  Procedures (including critical care time)  Labs Review Labs Reviewed - No data to display  Imaging Review No  results found.   MDM   1. Finger infection    Treat with Keflex and Epsom salts soaks. Ibuprofen and tramadol for pain. Follow-up as needed.    Charm Rings, MD 04/10/16 [phone]

## 2016-04-10 NOTE — Discharge Instructions (Signed)
Fingertip Infection When an infection is around the nail, it is called a paronychia. When it appears over the tip of the finger, it is called a felon. These infections are due to minor injuries or cracks in the skin. If they are not treated properly, they can lead to bone infection and permanent damage to the fingernail. Incision and drainage is necessary if a pus pocket (an abscess) has formed. Antibiotics and pain medicine may also be needed. Keep your hand elevated for the next 2-3 days to reduce swelling and pain. If a pack was placed in the abscess, it should be removed in 1-2 days by your caregiver. Soak the finger in warm water for 20 minutes 4 times daily to help promote drainage. Keep the hands as dry as possible. Wear protective gloves with cotton liners. See your caregiver for follow-up care as recommended.  HOME CARE INSTRUCTIONS   Keep wound clean, dry and dressed as suggested by your caregiver.  Soak in warm salt water for fifteen minutes, four times per day for bacterial infections.  Your caregiver will prescribe an antibiotic if a bacterial infection is suspected. Take antibiotics as directed and finish the prescription, even if the problem appears to be improving before the medicine is gone.  Only take over-the-counter or prescription medicines for pain, discomfort, or fever as directed by your caregiver. SEEK IMMEDIATE MEDICAL CARE IF:  There is redness, swelling, or increasing pain in the wound.  Pus or any other unusual drainage is coming from the wound.  An unexplained oral temperature above 102 F (38.9 C) develops.  You notice a foul smell coming from the wound or dressing. MAKE SURE YOU:   Understand these instructions.  Monitor your condition.  Contact your caregiver if you are getting worse or not improving.   This information is not intended to replace advice given to you by your health care provider. Make sure you discuss any questions you have with your  health care provider.   Document Released: 12/13/2004 Document Revised: 01/28/2012 Document Reviewed: 04/25/2015 Elsevier Interactive Patient Education 2016 [employer]vier Inc.  

## 2016-04-10 NOTE — ED Notes (Signed)
Pt  Reports    Pain  r   Index  Finger        X  1  Month  Off  And  On          Pt  Reports   He  Bumped it   Dev  Days  Ago          And  It  Has  Become  More  painfull           Pt  Reports   The  Pain       Is    intermittant           And  Is  Not  releived  By  Peroxide  Soaks

## 2016-08-23 IMAGING — CR DG KNEE 1-2V PORT*L*
2 series · 2 of 2 positions shown · non-contrast
Comparison: None.

CLINICAL DATA: Status post left unicompartmental knee replacement

EXAM:
PORTABLE LEFT KNEE - 1-2 VIEW

[knee lat]
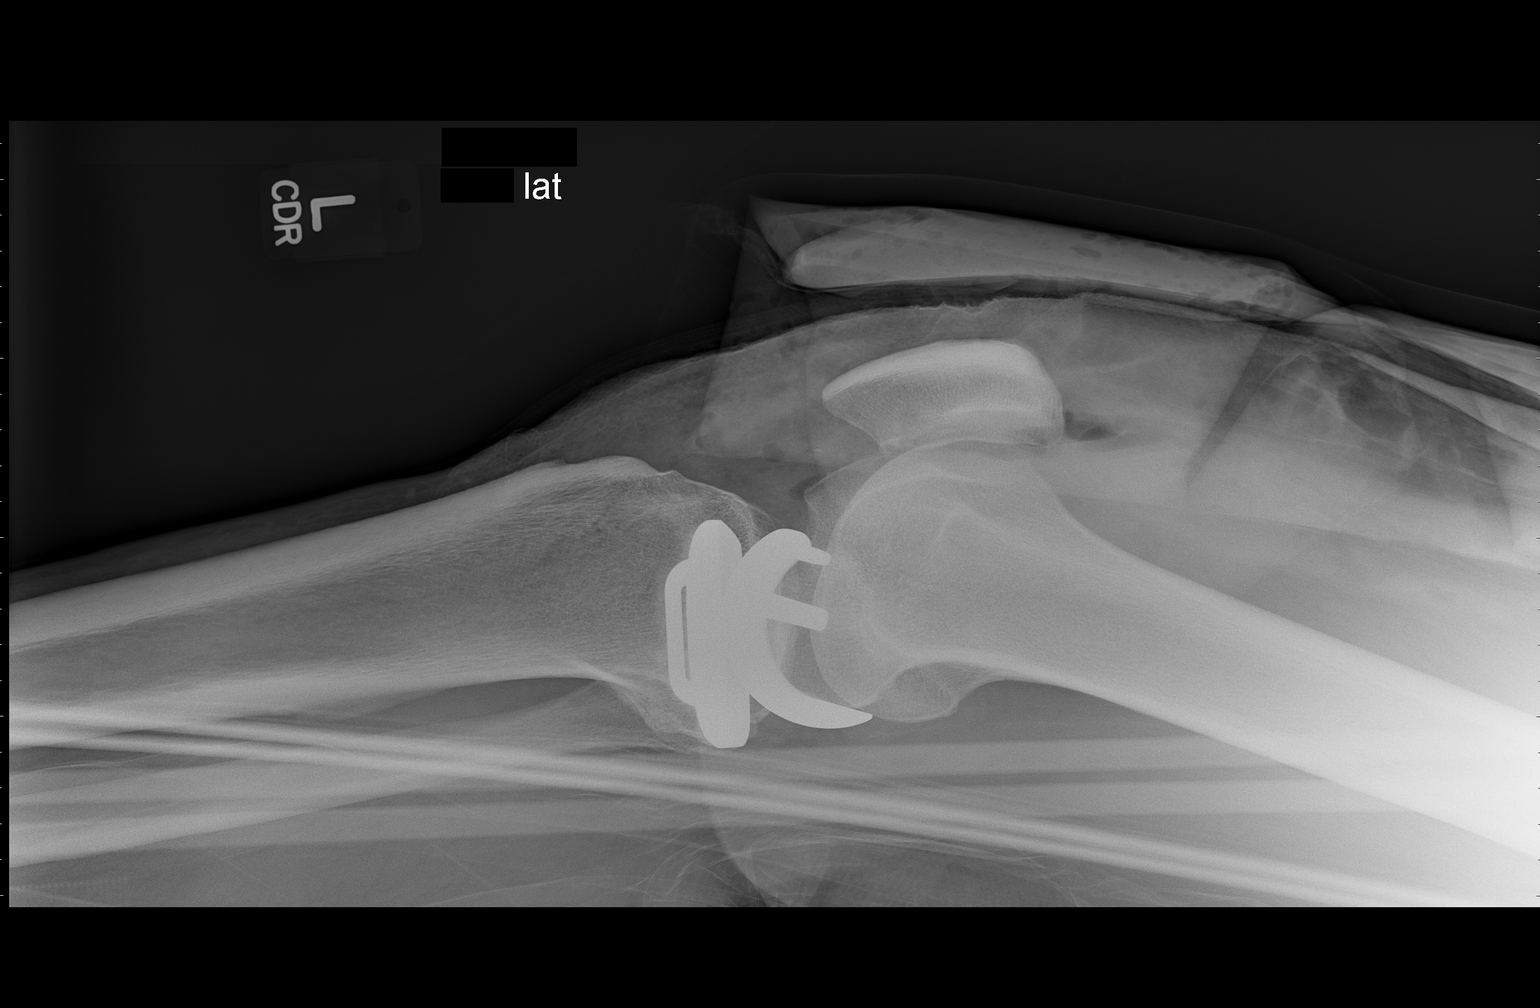

[ap/obl knee]
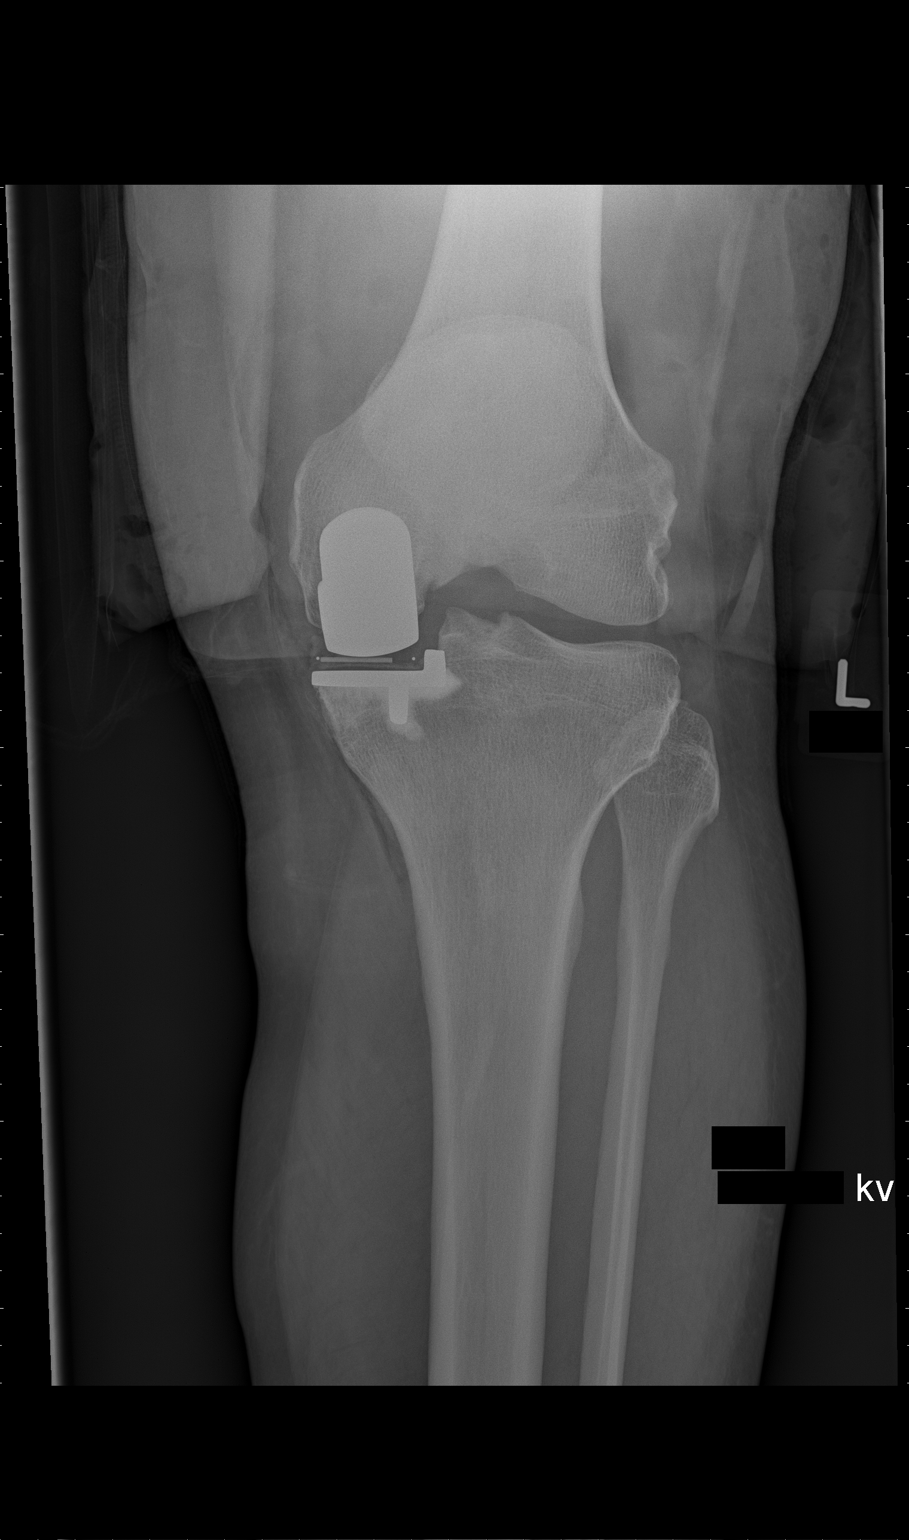

[2 of 2 positions shown; findings below may reference images not displayed]

FINDINGS: The left knee demonstrates a medial femorotibial compartment
arthroplasty without evidence of hardware failure complication.
There is no significant joint effusion. There is no fracture or
dislocation. The alignment is anatomic. Surgical drains are present.
IMPRESSION: Medial femorotibial compartment arthroplasty.

## 2016-09-24 DIAGNOSIS — M1711 Unilateral primary osteoarthritis, right knee: Secondary | ICD-10-CM | POA: Diagnosis present

## 2016-09-24 DIAGNOSIS — I10 Essential (primary) hypertension: Secondary | ICD-10-CM | POA: Diagnosis present

## 2016-09-28 ENCOUNTER — Encounter (HOSPITAL_BASED_OUTPATIENT_CLINIC_OR_DEPARTMENT_OTHER)
Admission: RE | Admit: 2016-09-28 | Discharge: 2016-09-28 | Disposition: A | Discharge status: Home / Self Care | Payer: Medicaid Other | Source: Ambulatory Visit | Attending: Orthopedic Surgery | Admitting: Orthopedic Surgery

## 2016-09-28 ENCOUNTER — Encounter (HOSPITAL_BASED_OUTPATIENT_CLINIC_OR_DEPARTMENT_OTHER): Payer: Self-pay | Admitting: *Deleted

## 2016-09-28 ENCOUNTER — Other Ambulatory Visit: Payer: Self-pay

## 2016-09-28 DIAGNOSIS — R9431 Abnormal electrocardiogram [ECG] [EKG]: Secondary | ICD-10-CM | POA: Diagnosis not present

## 2016-09-28 DIAGNOSIS — I10 Essential (primary) hypertension: Secondary | ICD-10-CM | POA: Diagnosis not present

## 2016-09-28 DIAGNOSIS — Z01812 Encounter for preprocedural laboratory examination: Secondary | ICD-10-CM | POA: Diagnosis not present

## 2016-09-28 DIAGNOSIS — M1711 Unilateral primary osteoarthritis, right knee: Secondary | ICD-10-CM | POA: Insufficient documentation

## 2016-09-28 DIAGNOSIS — Z01818 Encounter for other preprocedural examination: Secondary | ICD-10-CM | POA: Insufficient documentation

## 2016-09-28 DIAGNOSIS — E119 Type 2 diabetes mellitus without complications: Secondary | ICD-10-CM | POA: Diagnosis not present

## 2016-09-28 LAB — BASIC METABOLIC PANEL
ANION GAP: 12 (ref 5–15)
BUN: 7 mg/dL (ref 6–20)
CALCIUM: 9.4 mg/dL (ref 8.9–10.3)
CO2: 23 mmol/L (ref 22–32)
Chloride: 104 mmol/L (ref 101–111)
Creatinine, Ser: 0.89 mg/dL (ref 0.61–1.24)
GFR calc non Af Amer: >60 mL/min (ref >60)
Glucose, Bld: 157 mg/dL — ABNORMAL HIGH (ref 65–99)
Potassium: 4.1 mmol/L (ref 3.5–5.1)
SODIUM: 139 mmol/L (ref 135–145)

## 2016-09-28 LAB — SURGICAL PCR SCREEN
MRSA, PCR: NEGATIVE
Staphylococcus aureus: NEGATIVE

## 2016-09-28 NOTE — Progress Notes (Signed)
ekg reviewed by Dr Turk no further testing needed 

## 2016-10-02 NOTE — H&P (Signed)
PREOPERATIVE H&P Patient ID: Jeffery DamesJohn R Houston MRN: [id number] DOB/AGE: 12/30/1970 45 y.o.  Chief Complaint: OA right knee  Planned Procedure Date: 10/05/16 Medical and Cardiac Clearance by Dr. Jeri LagerPavlock   HPI: Jeffery Houston is a 45 y.o. male who presents for evaluation of OA right knee. The patient has a history of pain and functional disability in the right knee due to arthritis and has failed non-surgical conservative treatments for greater than 12 weeks to include NSAID's and/or analgesics, weight reduction as appropriate and activity modification.  Onset of symptoms was gradual, starting 4+ years ago with gradually worsening course since that time. Patient currently rates pain at 10 out of 10 with activity. Patient has night pain, worsening of pain with activity and weight bearing and pain that interferes with activities of daily living.  Patient has evidence of severe anterior-medial osteoarthritis of the right knee with good opening medially and preserved lateral space by imaging studies. There is no active infection.  Past Medical History:  Diagnosis Date  . Arthritis    bil knees  . Diabetes mellitus without complication (HCC)   . Hypertension    Past Surgical History:  Procedure Laterality Date  . FOOT SURGERY  2016  . PARTIAL KNEE ARTHROPLASTY Left 07/22/2015   Procedure: LEF PARTIAL KNEE REPLACEMENT ;  Surgeon: Sheral Apleyimothy D Murphy, MD;  Location: [facility];  Service: Orthopedics;  Laterality: Left;  . WRIST SURGERY Right 1991 or 1992   tendon repair   Allergies  Allergen Reactions  . Latex Hives    Medications: Tramadol 50 mg twice a day when necessary Metformin 500 mg 2 daily  Lisinopril 2.5 mg daily  Amlodipine 10 mg daily  Social History: Former smoker-2 packs per day 15 years. Quit 6 years ago. Drinks one beer per day.  Family History: Mother with heart disease, HTN, DM, CK D3. Father with HTN. Brother with HTN. Grandparents with HTN, DM, CVA.  ROS:  Currently denies lightheadedness, dizziness, Fever, chills, CP, SOB.  No personal history of DVT, PE, MI, or CVA. No loose teeth or dentures All other systems have been reviewed and were otherwise currently negative with the exception of those mentioned in the HPI and as above.  Objective: Vitals: Ht: 6'4" Wt: 279 Temp: 98.3 BP: 151/96 Pulse: 72 O2 96% on room air. Physical Exam: General: Alert, NAD.  Antalgic Gait  HEENT: EOMI, Good Neck Extension  Pulm: No increased work of breathing.  Clear B/L A/P w/o crackle or wheeze.  CV: RRR, No m/g/r appreciated  GI: soft, NT, ND Neuro: Neuro grossly intact b/l upper/lower ext.  Sensation intact distally Skin: No lesions in the area of chief complaint MSK/Surgical Site: right knee w/o redness or effusion.  + medial JLT. ROM 0-115.  5/5 strength in extension and flexion.  +EHL/FHL.  NVI.  Stable Lachman's and varus and valgus stress.    Imaging Review Plain radiographs demonstrate severe anterior-medial osteoarthritis of the right knee with good opening medially and preserved lateral space.   Assessment: OA right knee Principal Problem:   Primary osteoarthritis of right knee Active Problems:   S/P left unicompartmental knee replacement   Essential hypertension   Plan: Plan for Procedure(s): UNICOMPARTMENTAL KNEE  The patient history, physical exam, clinical judgement of the provider and imaging are consistent with end stage degenerative joint disease and unicompartmental joint arthroplasty is deemed medically necessary. The treatment options including medical management, injection therapy, and arthroplasty were discussed at length. The risks and benefits of Procedure(s):  UNICOMPARTMENTAL KNEE were presented and reviewed.  The risks of nonoperative treatment, versus surgical intervention including but not limited to continued pain, aseptic loosening, stiffness, dislocation/subluxation, infection, bleeding, nerve injury, blood clots,  cardiopulmonary complications, morbidity, mortality, among others were discussed. The patient verbalizes understanding and wishes to proceed with the plan.  Patient is being admitted for inpatient treatment for surgery, pain control, PT, OT, prophylactic antibiotics, VTE prophylaxis, progressive ambulation, ADL's and discharge planning.   Dental prophylaxis discussed and recommended for 2 years postoperatively.  The patient does  meet the criteria for TXA which will be used perioperatively via IV.   ASA 325 mg will be used postoperatively for DVT prophylaxis in addition to SCDs, and early ambulation. The patient is planning to be discharged home with OPPT in care of his family.  Lucretia KernHenry Calvin Martensen III,PA-C 10/02/2016 9:49 AM

## 2016-10-05 ENCOUNTER — Ambulatory Visit (HOSPITAL_BASED_OUTPATIENT_CLINIC_OR_DEPARTMENT_OTHER): Payer: Medicaid Other | Admitting: Anesthesiology

## 2016-10-05 ENCOUNTER — Ambulatory Visit (HOSPITAL_COMMUNITY): Payer: Medicaid Other

## 2016-10-05 ENCOUNTER — Encounter (HOSPITAL_BASED_OUTPATIENT_CLINIC_OR_DEPARTMENT_OTHER): Payer: Self-pay | Admitting: Anesthesiology

## 2016-10-05 ENCOUNTER — Ambulatory Visit (HOSPITAL_BASED_OUTPATIENT_CLINIC_OR_DEPARTMENT_OTHER)
Admission: RE | Admit: 2016-10-05 | Discharge: 2016-10-06 | Disposition: A | Discharge status: Home / Self Care | Payer: Medicaid Other | Source: Ambulatory Visit | Attending: Orthopedic Surgery | Admitting: Orthopedic Surgery

## 2016-10-05 ENCOUNTER — Encounter (HOSPITAL_BASED_OUTPATIENT_CLINIC_OR_DEPARTMENT_OTHER)
Admission: RE | Disposition: A | Discharge status: Home / Self Care | Payer: Self-pay | Source: Ambulatory Visit | Attending: Orthopedic Surgery

## 2016-10-05 DIAGNOSIS — M1711 Unilateral primary osteoarthritis, right knee: Secondary | ICD-10-CM

## 2016-10-05 DIAGNOSIS — I10 Essential (primary) hypertension: Secondary | ICD-10-CM | POA: Diagnosis present

## 2016-10-05 DIAGNOSIS — E119 Type 2 diabetes mellitus without complications: Secondary | ICD-10-CM | POA: Diagnosis not present

## 2016-10-05 DIAGNOSIS — Z87891 Personal history of nicotine dependence: Secondary | ICD-10-CM | POA: Insufficient documentation

## 2016-10-05 DIAGNOSIS — Z7984 Long term (current) use of oral hypoglycemic drugs: Secondary | ICD-10-CM | POA: Insufficient documentation

## 2016-10-05 DIAGNOSIS — Z833 Family history of diabetes mellitus: Secondary | ICD-10-CM | POA: Insufficient documentation

## 2016-10-05 DIAGNOSIS — M170 Bilateral primary osteoarthritis of knee: Secondary | ICD-10-CM | POA: Insufficient documentation

## 2016-10-05 DIAGNOSIS — Z91040 Latex allergy status: Secondary | ICD-10-CM | POA: Insufficient documentation

## 2016-10-05 DIAGNOSIS — Z8249 Family history of ischemic heart disease and other diseases of the circulatory system: Secondary | ICD-10-CM | POA: Diagnosis not present

## 2016-10-05 DIAGNOSIS — Z823 Family history of stroke: Secondary | ICD-10-CM | POA: Diagnosis not present

## 2016-10-05 DIAGNOSIS — Z96652 Presence of left artificial knee joint: Secondary | ICD-10-CM

## 2016-10-05 HISTORY — PX: PARTIAL KNEE ARTHROPLASTY: SHX2174

## 2016-10-05 LAB — GLUCOSE, CAPILLARY
Glucose-Capillary: 82 mg/dL (ref 65–99)
Glucose-Capillary: 151 mg/dL — ABNORMAL HIGH (ref 65–99)

## 2016-10-05 SURGERY — ARTHROPLASTY, KNEE, UNICOMPARTMENTAL
Anesthesia: General | Site: Knee | Laterality: Right

## 2016-10-05 MED ORDER — METFORMIN HCL 500 MG PO TABS
500.0000 mg | ORAL_TABLET | Freq: Two times a day (BID) | ORAL | Status: DC
  Administered 2016-10-05: 500 mg via ORAL

## 2016-10-05 MED ORDER — DEXAMETHASONE SODIUM PHOSPHATE 10 MG/ML IJ SOLN: 10.0000 mg | Freq: Once | INTRAMUSCULAR | Status: DC

## 2016-10-05 MED ORDER — DEXTROSE 5 % IV SOLN: 500.0000 mg | Freq: Four times a day (QID) | INTRAVENOUS | Status: DC | PRN

## 2016-10-05 MED ORDER — DEXAMETHASONE SODIUM PHOSPHATE 4 MG/ML IJ SOLN
INTRAMUSCULAR | Status: DC | PRN
Start: 2016-10-05 — End: 2016-10-05
  Administered 2016-10-05: 10 mg via INTRAVENOUS

## 2016-10-05 MED ORDER — LACTATED RINGERS IV SOLN
INTRAVENOUS | Status: DC
  Administered 2016-10-05: 16:00:00 via INTRAVENOUS

## 2016-10-05 MED ORDER — FENTANYL CITRATE (PF) 100 MCG/2ML IJ SOLN
INTRAMUSCULAR | Status: AC
  Filled 2016-10-05: qty 2

## 2016-10-05 MED ORDER — POLYETHYLENE GLYCOL 3350 17 G PO PACK: 17.0000 g | PACK | Freq: Every day | ORAL | Status: DC | PRN

## 2016-10-05 MED ORDER — FENTANYL CITRATE (PF) 100 MCG/2ML IJ SOLN
50.0000 ug | INTRAMUSCULAR | Status: DC | PRN
  Administered 2016-10-05 (×2): 100 ug via INTRAVENOUS

## 2016-10-05 MED ORDER — FENTANYL CITRATE (PF) 100 MCG/2ML IJ SOLN: 25.0000 ug | INTRAMUSCULAR | Status: DC | PRN

## 2016-10-05 MED ORDER — SORBITOL 70 % SOLN: 30.0000 mL | Freq: Every day | Status: DC | PRN

## 2016-10-05 MED ORDER — LACTATED RINGERS IV SOLN
INTRAVENOUS | Status: DC
  Administered 2016-10-05: 12:00:00 via INTRAVENOUS

## 2016-10-05 MED ORDER — MORPHINE SULFATE (PF) 2 MG/ML IV SOLN: 2.0000 mg | INTRAVENOUS | Status: DC | PRN

## 2016-10-05 MED ORDER — LACTATED RINGERS IV SOLN: INTRAVENOUS | Status: DC

## 2016-10-05 MED ORDER — HYDROMORPHONE HCL 1 MG/ML IJ SOLN
1.0000 mg | INTRAMUSCULAR | Status: DC | PRN
  Administered 2016-10-05 (×3): 0.5 mg via INTRAVENOUS

## 2016-10-05 MED ORDER — SENNA 8.6 MG PO TABS
1.0000 | ORAL_TABLET | Freq: Two times a day (BID) | ORAL | Status: DC
  Administered 2016-10-05: 8.6 mg via ORAL
  Filled 2016-10-05: qty 1

## 2016-10-05 MED ORDER — DOCUSATE SODIUM 100 MG PO CAPS
100.0000 mg | ORAL_CAPSULE | Freq: Two times a day (BID) | ORAL | Status: DC
  Administered 2016-10-05: 100 mg via ORAL
  Filled 2016-10-05: qty 1

## 2016-10-05 MED ORDER — EPHEDRINE 5 MG/ML INJ
INTRAVENOUS | Status: AC
  Filled 2016-10-05: qty 10

## 2016-10-05 MED ORDER — ACETAMINOPHEN 500 MG PO TABS
ORAL_TABLET | ORAL | Status: AC
Start: 2016-10-05 — End: 2016-10-05
  Filled 2016-10-05: qty 2

## 2016-10-05 MED ORDER — SODIUM CHLORIDE 0.9 % IJ SOLN
INTRAMUSCULAR | Status: DC | PRN
  Administered 2016-10-05: 30 mL

## 2016-10-05 MED ORDER — LIDOCAINE 2% (20 MG/ML) 5 ML SYRINGE
INTRAMUSCULAR | Status: DC | PRN
  Administered 2016-10-05: 50 mg via INTRAVENOUS

## 2016-10-05 MED ORDER — KETOROLAC TROMETHAMINE 30 MG/ML IJ SOLN
INTRAMUSCULAR | Status: DC | PRN
  Administered 2016-10-05: 30 mg via INTRA_ARTICULAR

## 2016-10-05 MED ORDER — KETOROLAC TROMETHAMINE 30 MG/ML IJ SOLN
INTRAMUSCULAR | Status: AC
  Filled 2016-10-05: qty 1

## 2016-10-05 MED ORDER — METHOCARBAMOL 500 MG PO TABS
500.0000 mg | ORAL_TABLET | Freq: Four times a day (QID) | ORAL | Status: DC | PRN
  Administered 2016-10-05 – 2016-10-06 (×3): 500 mg via ORAL
  Filled 2016-10-05 (×3): qty 1

## 2016-10-05 MED ORDER — ACETAMINOPHEN 500 MG PO TABS
1000.0000 mg | ORAL_TABLET | Freq: Once | ORAL | Status: AC
  Administered 2016-10-05: 1000 mg via ORAL

## 2016-10-05 MED ORDER — OXYCODONE HCL 5 MG PO TABS
5.0000 mg | ORAL_TABLET | ORAL | Status: DC | PRN
  Administered 2016-10-05 – 2016-10-06 (×4): 10 mg via ORAL
  Filled 2016-10-05 (×4): qty 2

## 2016-10-05 MED ORDER — HYDROMORPHONE HCL 1 MG/ML IJ SOLN
INTRAMUSCULAR | Status: AC
  Filled 2016-10-05: qty 1

## 2016-10-05 MED ORDER — CEFAZOLIN SODIUM-DEXTROSE 2-4 GM/100ML-% IV SOLN
2.0000 g | Freq: Four times a day (QID) | INTRAVENOUS | Status: AC
  Administered 2016-10-05 – 2016-10-06 (×2): 2 g via INTRAVENOUS
  Filled 2016-10-05: qty 100

## 2016-10-05 MED ORDER — GABAPENTIN 300 MG PO CAPS
300.0000 mg | ORAL_CAPSULE | Freq: Once | ORAL | Status: AC
  Administered 2016-10-05: 300 mg via ORAL

## 2016-10-05 MED ORDER — METOCLOPRAMIDE HCL 5 MG/ML IJ SOLN: 5.0000 mg | Freq: Three times a day (TID) | INTRAMUSCULAR | Status: DC | PRN

## 2016-10-05 MED ORDER — TRANEXAMIC ACID 1000 MG/10ML IV SOLN
1000.0000 mg | INTRAVENOUS | Status: AC
  Administered 2016-10-05: 1000 mg via INTRAVENOUS
  Filled 2016-10-05 (×2): qty 10

## 2016-10-05 MED ORDER — BUPIVACAINE-EPINEPHRINE (PF) 0.25% -1:200000 IJ SOLN
INTRAMUSCULAR | Status: DC | PRN
  Administered 2016-10-05: 30 mL

## 2016-10-05 MED ORDER — DOCUSATE SODIUM 100 MG PO CAPS: 100.0000 mg | ORAL_CAPSULE | Freq: Two times a day (BID) | ORAL | 0 refills | Status: DC

## 2016-10-05 MED ORDER — OXYCODONE-ACETAMINOPHEN 5-325 MG PO TABS: 1.0000 | ORAL_TABLET | ORAL | 0 refills | Status: DC | PRN

## 2016-10-05 MED ORDER — SCOPOLAMINE 1 MG/3DAYS TD PT72: 1.0000 | MEDICATED_PATCH | Freq: Once | TRANSDERMAL | Status: DC | PRN

## 2016-10-05 MED ORDER — ASPIRIN EC 325 MG PO TBEC: 325.0000 mg | DELAYED_RELEASE_TABLET | Freq: Every day | ORAL | 0 refills | Status: DC

## 2016-10-05 MED ORDER — OXYCODONE HCL 5 MG PO TABS: 5.0000 mg | ORAL_TABLET | Freq: Once | ORAL | Status: DC | PRN

## 2016-10-05 MED ORDER — MIDAZOLAM HCL 2 MG/2ML IJ SOLN
1.0000 mg | INTRAMUSCULAR | Status: DC | PRN
  Administered 2016-10-05: 2 mg via INTRAVENOUS

## 2016-10-05 MED ORDER — DEXAMETHASONE SODIUM PHOSPHATE 10 MG/ML IJ SOLN
INTRAMUSCULAR | Status: AC
  Filled 2016-10-05: qty 1

## 2016-10-05 MED ORDER — OXYCODONE HCL 5 MG/5ML PO SOLN: 5.0000 mg | Freq: Once | ORAL | Status: DC | PRN

## 2016-10-05 MED ORDER — SODIUM CHLORIDE 0.9 % IJ SOLN
INTRAMUSCULAR | Status: AC
  Filled 2016-10-05: qty 30

## 2016-10-05 MED ORDER — ONDANSETRON HCL 4 MG/2ML IJ SOLN: 4.0000 mg | Freq: Once | INTRAMUSCULAR | Status: DC | PRN

## 2016-10-05 MED ORDER — ONDANSETRON HCL 4 MG/2ML IJ SOLN: 4.0000 mg | Freq: Four times a day (QID) | INTRAMUSCULAR | Status: DC | PRN

## 2016-10-05 MED ORDER — ONDANSETRON HCL 4 MG/2ML IJ SOLN
INTRAMUSCULAR | Status: DC | PRN
  Administered 2016-10-05: 4 mg via INTRAVENOUS

## 2016-10-05 MED ORDER — OMEPRAZOLE 20 MG PO CPDR: 20.0000 mg | DELAYED_RELEASE_CAPSULE | Freq: Every day | ORAL | 2 refills | Status: DC

## 2016-10-05 MED ORDER — ACETAMINOPHEN 650 MG RE SUPP: 650.0000 mg | Freq: Four times a day (QID) | RECTAL | Status: DC | PRN

## 2016-10-05 MED ORDER — MENTHOL 3 MG MT LOZG: 1.0000 | LOZENGE | OROMUCOSAL | Status: DC | PRN

## 2016-10-05 MED ORDER — ONDANSETRON HCL 4 MG PO TABS: 4.0000 mg | ORAL_TABLET | Freq: Three times a day (TID) | ORAL | 0 refills | Status: DC | PRN

## 2016-10-05 MED ORDER — DIPHENHYDRAMINE HCL 12.5 MG/5ML PO ELIX
12.5000 mg | ORAL_SOLUTION | ORAL | Status: DC | PRN
Start: 2016-10-05 — End: 2016-10-06

## 2016-10-05 MED ORDER — AMLODIPINE BESYLATE 10 MG PO TABS: 10.0000 mg | ORAL_TABLET | Freq: Every day | ORAL | Status: DC

## 2016-10-05 MED ORDER — ACETAMINOPHEN 325 MG PO TABS
650.0000 mg | ORAL_TABLET | Freq: Four times a day (QID) | ORAL | Status: DC
  Administered 2016-10-05 – 2016-10-06 (×3): 650 mg via ORAL
  Filled 2016-10-05 (×3): qty 2

## 2016-10-05 MED ORDER — CEFAZOLIN SODIUM-DEXTROSE 2-4 GM/100ML-% IV SOLN
INTRAVENOUS | Status: AC
  Filled 2016-10-05: qty 100

## 2016-10-05 MED ORDER — PHENOL 1.4 % MT LIQD: 1.0000 | OROMUCOSAL | Status: DC | PRN

## 2016-10-05 MED ORDER — LISINOPRIL 10 MG PO TABS: 10.0000 mg | ORAL_TABLET | Freq: Every day | ORAL | Status: DC

## 2016-10-05 MED ORDER — CHLORHEXIDINE GLUCONATE 4 % EX LIQD: 60.0000 mL | Freq: Once | CUTANEOUS | Status: DC

## 2016-10-05 MED ORDER — PROPOFOL 500 MG/50ML IV EMUL
INTRAVENOUS | Status: AC
  Filled 2016-10-05: qty 50

## 2016-10-05 MED ORDER — MORPHINE SULFATE (PF) 10 MG/ML IV SOLN
INTRAVENOUS | Status: AC
  Filled 2016-10-05: qty 1

## 2016-10-05 MED ORDER — EPHEDRINE SULFATE 50 MG/ML IJ SOLN
INTRAMUSCULAR | Status: DC | PRN
  Administered 2016-10-05: 15 mg via INTRAVENOUS

## 2016-10-05 MED ORDER — ACETAMINOPHEN 325 MG PO TABS: 650.0000 mg | ORAL_TABLET | Freq: Four times a day (QID) | ORAL | Status: DC | PRN

## 2016-10-05 MED ORDER — LIDOCAINE 2% (20 MG/ML) 5 ML SYRINGE
INTRAMUSCULAR | Status: AC
  Filled 2016-10-05: qty 5

## 2016-10-05 MED ORDER — GABAPENTIN 300 MG PO CAPS
ORAL_CAPSULE | ORAL | Status: AC
  Filled 2016-10-05: qty 1

## 2016-10-05 MED ORDER — MIDAZOLAM HCL 2 MG/2ML IJ SOLN
INTRAMUSCULAR | Status: AC
  Filled 2016-10-05: qty 2

## 2016-10-05 MED ORDER — METHOCARBAMOL 500 MG PO TABS: 500.0000 mg | ORAL_TABLET | Freq: Four times a day (QID) | ORAL | 0 refills | Status: DC | PRN

## 2016-10-05 MED ORDER — DEXTROSE 5 % IV SOLN
3.0000 g | INTRAVENOUS | Status: AC
  Administered 2016-10-05: 3 g via INTRAVENOUS

## 2016-10-05 MED ORDER — ASPIRIN EC 325 MG PO TBEC
325.0000 mg | DELAYED_RELEASE_TABLET | Freq: Every day | ORAL | Status: DC
  Administered 2016-10-06: 325 mg via ORAL
  Filled 2016-10-05: qty 1

## 2016-10-05 MED ORDER — ONDANSETRON HCL 4 MG PO TABS: 4.0000 mg | ORAL_TABLET | Freq: Four times a day (QID) | ORAL | Status: DC | PRN

## 2016-10-05 MED ORDER — DEXAMETHASONE SODIUM PHOSPHATE 10 MG/ML IJ SOLN
10.0000 mg | Freq: Once | INTRAMUSCULAR | Status: AC
  Administered 2016-10-06: 10 mg via INTRAVENOUS
  Filled 2016-10-05: qty 1

## 2016-10-05 MED ORDER — METOCLOPRAMIDE HCL 5 MG PO TABS: 5.0000 mg | ORAL_TABLET | Freq: Three times a day (TID) | ORAL | Status: DC | PRN

## 2016-10-05 MED ORDER — SODIUM CHLORIDE 0.9 % IR SOLN
Status: DC | PRN
  Administered 2016-10-05: 2000 mL

## 2016-10-05 MED ORDER — KETOROLAC TROMETHAMINE 15 MG/ML IJ SOLN
15.0000 mg | Freq: Four times a day (QID) | INTRAMUSCULAR | Status: DC
  Administered 2016-10-05 – 2016-10-06 (×2): 15 mg via INTRAVENOUS
  Filled 2016-10-05 (×2): qty 1

## 2016-10-05 MED ORDER — PROPOFOL 10 MG/ML IV BOLUS
INTRAVENOUS | Status: DC | PRN
  Administered 2016-10-05: 200 mg via INTRAVENOUS

## 2016-10-05 MED ORDER — ONDANSETRON HCL 4 MG/2ML IJ SOLN
INTRAMUSCULAR | Status: AC
  Filled 2016-10-05: qty 2

## 2016-10-05 MED ORDER — MORPHINE SULFATE 10 MG/ML IJ SOLN
INTRAMUSCULAR | Status: DC | PRN
  Administered 2016-10-05: 3 mg via INTRAVENOUS
  Administered 2016-10-05 (×2): 2 mg via INTRAVENOUS

## 2016-10-05 SURGICAL SUPPLY — 69 items
ADH SKN CLS APL DERMABOND .7 (GAUZE/BANDAGES/DRESSINGS)
APL SKNCLS STERI-STRIP NONHPOA (GAUZE/BANDAGES/DRESSINGS) ×1
BANDAGE ACE 4X5 VEL STRL LF (GAUZE/BANDAGES/DRESSINGS) ×3 IMPLANT
BANDAGE ACE 6X5 VEL STRL LF (GAUZE/BANDAGES/DRESSINGS) ×3 IMPLANT
BANDAGE ESMARK 6X9 LF (GAUZE/BANDAGES/DRESSINGS) ×1 IMPLANT
BENZOIN TINCTURE PRP APPL 2/3 (GAUZE/BANDAGES/DRESSINGS) ×3 IMPLANT
BLADE SURG 10 STRL SS (BLADE) ×3 IMPLANT
BLADE SURG 15 STRL LF DISP TIS (BLADE) ×2 IMPLANT
BLADE SURG 15 STRL SS (BLADE) ×6
BNDG CMPR 9X6 STRL LF SNTH (GAUZE/BANDAGES/DRESSINGS) ×1
BNDG ESMARK 6X9 LF (GAUZE/BANDAGES/DRESSINGS) ×3
BONE CEMENT PALACOSE (Orthopedic Implant) ×3 IMPLANT
BOWL SMART MIX CTS (DISPOSABLE) ×3 IMPLANT
CANISTER SUCT 1200ML W/VALVE (MISCELLANEOUS) ×3 IMPLANT
CAPT KNEE PARTIAL 2 ×2 IMPLANT
CEMENT BONE PALACOSE (Orthopedic Implant) ×1 IMPLANT
CHLORAPREP W/TINT 26ML (MISCELLANEOUS) ×3 IMPLANT
CLOSURE STERI-STRIP 1/2X4 (GAUZE/BANDAGES/DRESSINGS) ×1
CLSR STERI-STRIP ANTIMIC 1/2X4 (GAUZE/BANDAGES/DRESSINGS) ×2 IMPLANT
COVER BACK TABLE 60X90IN (DRAPES) ×3 IMPLANT
CUFF TOURNIQUET SINGLE 34IN LL (TOURNIQUET CUFF) IMPLANT
DERMABOND ADVANCED (GAUZE/BANDAGES/DRESSINGS)
DERMABOND ADVANCED .7 DNX12 (GAUZE/BANDAGES/DRESSINGS) ×1 IMPLANT
DRAPE EXTREMITY T 121X128X90 (DRAPE) ×3 IMPLANT
DRAPE IMP U-DRAPE 54X76 (DRAPES) ×3 IMPLANT
DRAPE INCISE IOBAN 66X45 STRL (DRAPES) ×3 IMPLANT
DRAPE U-SHAPE 47X51 STRL (DRAPES) ×3 IMPLANT
DRSG MEPILEX BORDER 4X8 (GAUZE/BANDAGES/DRESSINGS) ×3 IMPLANT
ELECT REM PT RETURN 9FT ADLT (ELECTROSURGICAL) ×3
ELECTRODE REM PT RTRN 9FT ADLT (ELECTROSURGICAL) ×1 IMPLANT
GAUZE SPONGE 4X4 12PLY STRL (GAUZE/BANDAGES/DRESSINGS) ×3 IMPLANT
GLOVE BIO SURGEON STRL SZ7.5 (GLOVE) ×2 IMPLANT
GLOVE BIOGEL PI IND STRL 8 (GLOVE) ×2 IMPLANT
GLOVE BIOGEL PI INDICATOR 8 (GLOVE) ×4
GLOVE SURG SS PI 7.0 STRL IVOR (GLOVE) ×4 IMPLANT
GLOVE SURG SS PI 7.5 STRL IVOR (GLOVE) ×4 IMPLANT
GOWN STRL REUS W/ TWL LRG LVL3 (GOWN DISPOSABLE) ×3 IMPLANT
GOWN STRL REUS W/TWL LRG LVL3 (GOWN DISPOSABLE) ×9
IMMOBILIZER KNEE 22 UNIV (SOFTGOODS) IMPLANT
IMMOBILIZER KNEE 24 THIGH 36 (MISCELLANEOUS) ×1 IMPLANT
IMMOBILIZER KNEE 24 UNIV (MISCELLANEOUS) ×3
KNEE WRAP E Z 3 GEL PACK (MISCELLANEOUS) ×3 IMPLANT
MANIFOLD NEPTUNE II (INSTRUMENTS) ×3 IMPLANT
NS IRRIG 1000ML POUR BTL (IV SOLUTION) ×3 IMPLANT
PACK ARTHROSCOPY DSU (CUSTOM PROCEDURE TRAY) ×3 IMPLANT
PACK BASIN DAY SURGERY FS (CUSTOM PROCEDURE TRAY) ×3 IMPLANT
PACK BLADE SAW RECIP 70 3 PT (BLADE) ×3 IMPLANT
PADDING CAST COTTON 6X4 STRL (CAST SUPPLIES) ×3 IMPLANT
PENCIL BUTTON HOLSTER BLD 10FT (ELECTRODE) ×3 IMPLANT
PILLOW KNEE EXTENSION 0 DEG (MISCELLANEOUS) ×2 IMPLANT
SHEET MEDIUM DRAPE 40X70 STRL (DRAPES) ×3 IMPLANT
SLEEVE SCD COMPRESS KNEE MED (MISCELLANEOUS) ×3 IMPLANT
SPONGE LAP 18X18 X RAY DECT (DISPOSABLE) ×3 IMPLANT
SUCTION FRAZIER HANDLE 10FR (MISCELLANEOUS) ×2
SUCTION TUBE FRAZIER 10FR DISP (MISCELLANEOUS) ×1 IMPLANT
SUT MNCRL AB 4-0 PS2 18 (SUTURE) ×3 IMPLANT
SUT MON AB 2-0 CT1 36 (SUTURE) ×3 IMPLANT
SUT VIC AB 0 CT1 27 (SUTURE) ×3
SUT VIC AB 0 CT1 27XBRD ANBCTR (SUTURE) ×1 IMPLANT
SUT VIC AB 1 CT1 27 (SUTURE) ×3
SUT VIC AB 1 CT1 27XBRD ANBCTR (SUTURE) ×1 IMPLANT
SUT VIC AB 2-0 SH 27 (SUTURE)
SUT VIC AB 2-0 SH 27XBRD (SUTURE) IMPLANT
SYR BULB IRRIGATION 50ML (SYRINGE) ×3 IMPLANT
TOWEL OR 17X24 6PK STRL BLUE (TOWEL DISPOSABLE) ×3 IMPLANT
TOWEL OR NON WOVEN STRL DISP B (DISPOSABLE) ×6 IMPLANT
TUBE CONNECTING 20'X1/4 (TUBING)
TUBE CONNECTING 20X1/4 (TUBING) IMPLANT
YANKAUER SUCT BULB TIP NO VENT (SUCTIONS) ×3 IMPLANT

## 2016-10-05 NOTE — Progress Notes (Signed)
AssistedDr. Joslin with right, ultrasound guided, adductor canal block. Side rails up, monitors on throughout procedure. See vital signs in flow sheet. Tolerated Procedure well.  

## 2016-10-05 NOTE — Anesthesia Postprocedure Evaluation (Signed)
Anesthesia Post Note  Patient: Bryson DamesJohn R Regner  Procedure(s) Performed: Procedure(s) (LRB): UNICOMPARTMENTAL KNEE (Right)  Patient location during evaluation: PACU Anesthesia Type: General Level of consciousness: awake, awake and alert and oriented Pain management: pain level controlled Vital Signs Assessment: post-procedure vital signs reviewed and stable Respiratory status: spontaneous breathing, nonlabored ventilation, respiratory function stable and patient connected to nasal cannula oxygen Cardiovascular status: blood pressure returned to baseline Anesthetic complications: no    Last Vitals:  Vitals:   10/05/16 1545 10/05/16 1645  BP: (!) 158/101 (!) 178/97  Pulse: 81 81  Resp: 13 18  Temp:  37.2 C    Last Pain:  Vitals:   10/05/16 1645  TempSrc:   PainSc: 8         RLE Motor Response: Purposeful movement (10/05/16 1645) RLE Sensation: Full sensation (10/05/16 1645)      Moniqua Engebretsen COKER

## 2016-10-05 NOTE — Interval H&P Note (Signed)
History and Physical Interval Note:  10/05/2016 12:42 PM  Jeffery Houston  has presented today for surgery, with the diagnosis of OA right knee  The various methods of treatment have been discussed with the patient and family. After consideration of risks, benefits and other options for treatment, the patient has consented to  Procedure(s) with comments: UNICOMPARTMENTAL KNEE (Right) - Pre/Post Op femoral nerve block as a surgical intervention .  The patient's history has been reviewed, patient examined, no change in status, stable for surgery.  I have reviewed the patient's chart and labs.  Questions were answered to the patient's satisfaction.     Roschelle Calandra D

## 2016-10-05 NOTE — Anesthesia Preprocedure Evaluation (Signed)
Anesthesia Evaluation  Patient identified by MRN, date of birth, ID band Patient awake    Reviewed: Allergy & Precautions, NPO status , Patient's Chart, lab work & pertinent test results  Airway Mallampati: II  TM Distance: >3 FB Neck ROM: Full    Dental  (+) Teeth Intact, Dental Advisory Given   Pulmonary former smoker,    breath sounds clear to auscultation       Cardiovascular hypertension,  Rhythm:Regular Rate:Normal     Neuro/Psych    GI/Hepatic   Endo/Other  diabetes  Renal/GU      Musculoskeletal   Abdominal   Peds  Hematology   Anesthesia Other Findings   Reproductive/Obstetrics                             Anesthesia Physical Anesthesia Plan  ASA: III  Anesthesia Plan: General and Regional   Post-op Pain Management:    Induction: Intravenous  Airway Management Planned: LMA  Additional Equipment:   Intra-op Plan:   Post-operative Plan:   Informed Consent: I have reviewed the patients History and Physical, chart, labs and discussed the procedure including the risks, benefits and alternatives for the proposed anesthesia with the patient or authorized representative who has indicated his/her understanding and acceptance.   Dental advisory given  Plan Discussed with: CRNA and Anesthesiologist  Anesthesia Plan Comments:         Anesthesia Quick Evaluation

## 2016-10-05 NOTE — Op Note (Signed)
10/05/2016  2:22 PM  PATIENT:  Jeffery Houston    PRE-OPERATIVE DIAGNOSIS:  Osteoarthritis right knee  POST-OPERATIVE DIAGNOSIS:  Same  PROCEDURE:  UNICOMPARTMENTAL KNEE  SURGEON:  Tniyah Nakagawa, Jewel BaizeIMOTHY D, MD  PHYSICIAN ASSISTANT: Aquilla HackerHenry Martensen, PA-C, he was present and scrubbed throughout the case, critical for completion in a timely fashion, and for retraction, instrumentation, and closure.   ANESTHESIA:   General  PREOPERATIVE INDICATIONS:  Jeffery Houston is a  45 y.o. male with a diagnosis of Osteoarthritis right knee who failed conservative measures and elected for surgical management.    The risks benefits and alternatives were discussed with the patient preoperatively including but not limited to the risks of infection, bleeding, nerve injury, cardiopulmonary complications, blood clots, the need for revision surgery, among others, and the patient was willing to proceed.  OPERATIVE IMPLANTS: Biomet Oxford mobile bearing medial compartment arthroplasty. Femoral Component: large. Tibial tray: D, Size 3 poly.   OPERATIVE FINDINGS: Endstage grade 4 medial compartment osteoarthritis. No significant changes in the lateral or patellofemoral joint  OPERATIVE PROCEDURE: The patient was brought to the operating room placed in supine position. General anesthesia was administered. IV antibiotics were given. The lower extremity was placed in the legholder and prepped and draped in usual sterile fashion.  Time out was performed.  The leg was elevated and exsanguinated and the tourniquet was inflated. Anteromedial incision was performed, and I took care to preserve the MCL. Parapatellar incision was carried out, and the osteophytes were excised, along with the medial meniscus and a small portion of the fat pad.  The extra medullary tibial cutting jig was applied, using the spoon and the 4mm G-Clamp, and I took care to protect the anterior cruciate ligament insertion and the tibial spine. The medial  collateral ligament was also protected, and I resected my proximal tibia, matching the anatomic slope.   The proximal tibial bony cut was removed in one piece, and I turned my attention to the femur.  The intramedullary femoral rod was placed using the drill, and then using the appropriate reference, I assembled the femoral jig, setting my posterior cutting block. I resected my posterior femur, and then measured my gap.   I then used the mill to match the extension gap to the flexion gap. The gaps were then measured again with the appropriate feeler gauges. Once I had balanced flexion and extension gaps, I then completed the preparation of the femur.  I milled off the anterior aspect of the distal femur to prevent impingement. I also exposed the tibia, and selected the above-named component, and then used the cutting jig to prepare the keel slot on the tibia. I also used the awl to curette out the bone to complete the preparation of the keel. The back wall was intact.  I then placed trial components, and it was found to have excellent motion, and appropriate balance.  I then cemented the components into place, cementing the tibia first, removing all excess cement, and then cementing the femur.  All loose cement was removed.  The real polyethylene insert was applied manually, and the knee was taken through functional range of motion, and found to have excellent stability and restoration of joint motion, with excellent balance.  The wounds were irrigated copiously, and the parapatellar tissue closed with Vicryl, followed by Vicryl for the subcutaneous tissue, with routine closure with Steri-Strips and sterile gauze.  The tourniquet was released, and the patient was awakened and extubated and returned to PACU in  stable and satisfactory condition. There were no complications.  POSTOPERATIVE PLAN: DVT px will consist of SCD's and ASA 325, WBAT     Sheral Apleyimothy D Fransico Sciandra, MD

## 2016-10-05 NOTE — Discharge Instructions (Signed)

## 2016-10-05 NOTE — Transfer of Care (Signed)
Immediate Anesthesia Transfer of Care Note  Patient: Jeffery Houston  Procedure(s) Performed: Procedure(s) with comments: UNICOMPARTMENTAL KNEE (Right) - Pre/Post Op femoral nerve block  Patient Location: PACU  Anesthesia Type:General  Level of Consciousness: awake and sedated  Airway & Oxygen Therapy: Patient Spontanous Breathing and Patient connected to face mask oxygen  Post-op Assessment: Report given to RN and Post -op Vital signs reviewed and stable  Post vital signs: Reviewed and stable  Last Vitals:  Vitals:   10/05/16 1202 10/05/16 1235  BP: 139/82 (!) 150/93  Pulse: 86   Temp: 36.8 C     Last Pain:  Vitals:   10/05/16 1202  TempSrc: Oral  PainSc:       Patients Stated Pain Goal: 2 (10/05/16 1202)  Complications: No apparent anesthesia complications 

## 2016-10-05 NOTE — Anesthesia Procedure Notes (Signed)
Procedure Name: LMA Insertion Performed by: Mendy Lapinsky W Pre-anesthesia Checklist: Patient identified, Emergency Drugs available, Suction available and Patient being monitored Patient Re-evaluated:Patient Re-evaluated prior to inductionOxygen Delivery Method: Circle system utilized Preoxygenation: Pre-oxygenation with 100% oxygen Intubation Type: IV induction Ventilation: Mask ventilation without difficulty LMA: LMA inserted LMA Size: 5.0 Number of attempts: 1 Placement Confirmation: positive ETCO2 Tube secured with: Tape Dental Injury: Teeth and Oropharynx as per pre-operative assessment        

## 2016-10-05 NOTE — Transfer of Care (Signed)
Immediate Anesthesia Transfer of Care Note  Patient: Jeffery DamesJohn R Houston  Procedure(s) Performed: Procedure(s) with comments: UNICOMPARTMENTAL KNEE (Right) - Pre/Post Op femoral nerve block  Patient Location: PACU  Anesthesia Type:General  Level of Consciousness: awake and sedated  Airway & Oxygen Therapy: Patient Spontanous Breathing and Patient connected to face mask oxygen  Post-op Assessment: Report given to RN and Post -op Vital signs reviewed and stable  Post vital signs: Reviewed and stable  Last Vitals:  Vitals:   10/05/16 1202 10/05/16 1235  BP: 139/82 (!) 150/93  Pulse: 86   Temp: 36.8 C     Last Pain:  Vitals:   10/05/16 1202  TempSrc: Oral  PainSc:       Patients Stated Pain Goal: 2 (10/05/16 1202)  Complications: No apparent anesthesia complications

## 2016-10-06 DIAGNOSIS — M170 Bilateral primary osteoarthritis of knee: Secondary | ICD-10-CM | POA: Diagnosis not present

## 2016-10-08 ENCOUNTER — Encounter (HOSPITAL_BASED_OUTPATIENT_CLINIC_OR_DEPARTMENT_OTHER): Payer: Self-pay | Admitting: Orthopedic Surgery

## 2016-10-08 ENCOUNTER — Ambulatory Visit: Payer: Medicaid Other

## 2016-11-02 ENCOUNTER — Ambulatory Visit: Payer: Medicaid Other | Attending: Nurse Practitioner

## 2016-11-02 ENCOUNTER — Ambulatory Visit: Payer: Medicaid Other | Admitting: Physical Therapy

## 2016-11-02 DIAGNOSIS — Z96651 Presence of right artificial knee joint: Secondary | ICD-10-CM | POA: Insufficient documentation

## 2016-11-02 DIAGNOSIS — R262 Difficulty in walking, not elsewhere classified: Secondary | ICD-10-CM | POA: Diagnosis present

## 2016-11-02 DIAGNOSIS — M25661 Stiffness of right knee, not elsewhere classified: Secondary | ICD-10-CM | POA: Insufficient documentation

## 2016-11-02 DIAGNOSIS — M25561 Pain in right knee: Secondary | ICD-10-CM | POA: Diagnosis present

## 2016-11-02 DIAGNOSIS — R600 Localized edema: Secondary | ICD-10-CM | POA: Insufficient documentation

## 2016-11-02 DIAGNOSIS — M25461 Effusion, right knee: Secondary | ICD-10-CM | POA: Diagnosis present

## 2016-11-02 NOTE — Therapy (Signed)
[facility] [address] [place], [place], [address] Phone: [phone]   Fax:  [phone]  Physical Therapy Evaluation  Patient Details  Name: Jeffery Houston MRN: [id number] Date of Birth: 1971/04/17 Referring Provider: Sharyn Creamer, MD  Encounter Date: 11/02/2016      PT End of Session - 11/02/16 1013    Visit Number 1   Number of Visits 12   Date for PT Re-Evaluation 12/14/16   Authorization Type Medicaid   PT Start Time 0935   PT Stop Time 1020   PT Time Calculation (min) 45 min   Activity Tolerance Patient tolerated treatment well;No increased pain   Behavior During Therapy WFL for tasks assessed/performed      Past Medical History:  Diagnosis Date  . Arthritis    bil knees  . Diabetes mellitus without complication (HCC)   . Hypertension     Past Surgical History:  Procedure Laterality Date  . FOOT SURGERY    . PARTIAL KNEE ARTHROPLASTY Left 07/22/2015   Procedure: LEF PARTIAL KNEE REPLACEMENT ;  Surgeon: Sheral Apley, MD;  Location: [facility];  Service: Orthopedics;  Laterality: Left;  . PARTIAL KNEE ARTHROPLASTY Right 10/05/2016   Procedure: UNICOMPARTMENTAL KNEE;  Surgeon: Sheral Apley, MD;  Location:  SURGERY CENTER;  Service: Orthopedics;  Laterality: Right;  Pre/Post Op femoral nerve block  . WRIST SURGERY Right    tendon repair    There were no vitals filed for this visit.       Subjective Assessment - 11/02/16 0938    Subjective He report Partial knee replacement RT knee, due to pain and degenerative changes. Pain most when trying to go to sleep. He reports episodes of swelling with standing too long   How long can you sit comfortably? As needed but with knee extended   How long can you stand comfortably? 2-3 hours   How long can you walk comfortably? 1/2 mile   Patient Stated Goals He wants to improve ROM   Currently in Pain? Yes   Pain Score 5   varies as  he needed a walker recently due to pain and swellin   Pain Location Knee   Pain Orientation Right   Pain Type Surgical pain   Pain Onset More than a month ago   Pain Frequency Intermittent   Aggravating Factors  standing , end range positions   Pain Relieving Factors elevation Eugenio Hoes            [facility] PT Assessment - 11/02/16 0001      Assessment   Referring Provider Sharyn Creamer, MD   Onset Date/Surgical Date 10/05/16   Next MD Visit 11/16/16   Prior Therapy no PT since surgery     Precautions   Precautions None     Restrictions   Weight Bearing Restrictions No     Balance Screen   Has the patient fallen in the past 6 months No   Has the patient had a decrease in activity level because of a fear of falling?  No   Is the patient reluctant to leave their home because of a fear of falling?  No     Prior Function   Level of Independence Independent   Vocation Self employed     Cognition   Overall Cognitive Status Within Functional Limits for tasks assessed     Observation/Other Assessments   Focus on Therapeutic Outcomes (FOTO)  46% limited     Observation/Other  Assessments-Edema    Edema Circumferential     Circumferential Edema   Circumferential - Right 51 Cm  mid patella   Circumferential - Left  48 cm  mid patella      ROM / Strength   AROM / PROM / Strength AROM;PROM;Strength     AROM   AROM Assessment Site Knee   Right/Left Knee Right;Left   Right Knee Extension -20   Right Knee Flexion 95   Left Knee Extension -5   Left Knee Flexion 125     PROM   PROM Assessment Site Knee   Right/Left Knee Right   Right Knee Extension -3   Right Knee Flexion 105     Strength   Strength Assessment Site Knee   Right/Left Knee Right;Left   Right Knee Flexion 4+/5   Right Knee Extension 5/5   Left Knee Flexion 5/5   Left Knee Extension 5/5     Flexibility   Soft Tissue Assessment /Muscle Length yes   Hamstrings Passive 75 degrees bilateral     Palpation    Patella mobility WNL     Ambulation/Gait   Gait Comments No device , WBAT, Decr weight to Rt leg.                    [facility] Adult PT Treatment/Exercise - 11/02/16 0001      Exercises   Exercises Knee/Hip     Modalities   Modalities Vasopneumatic     Vasopneumatic   Number Minutes Vasopneumatic  15 minutes   Vasopnuematic Location  Knee  right   Vasopneumatic Pressure Medium   Vasopneumatic Temperature  35     Manual Therapy   Manual Therapy Joint mobilization;Passive ROM;Soft tissue mobilization   Joint Mobilization AP tibial mobs RT Gr 3-4   Passive ROM Hamstring strethcing x 3 15 sec and knee flexion                 PT Education - 11/02/16 1008    Education provided Yes   Education Details POC   Person(s) Educated Patient   Methods Explanation   Comprehension Verbalized understanding          PT Short Term Goals - 11/02/16 1008      PT SHORT TERM GOAL #1   Title pt will be I with basic HEP    Baseline No progranm   Time 2   Period Weeks   Status New     PT SHORT TERM GOAL #2   Title Improve RT knee flexion to 105 degrees active    Baseline 95 degrees at eval   Time 2   Period Weeks   Status New     PT SHORT TERM GOAL #3   Title Swelling reduced to 50 cm or less RT knee   Baseline 51 Cm at eval           PT Long Term Goals - 11/02/16 1010      PT LONG TERM GOAL #1   Title pt will be I with all HEP given throughout therapy )   Baseline independent with intial HEP   Time 6   Period Weeks   Status New     PT LONG TERM GOAL #2   Title pt will increaes L knee flexion to >120 and extension to 0 to assist with functional and efficient gait pattern    Baseline -20 degrees at eval   Time 6   Period Weeks   Status  New     PT LONG TERM GOAL #3   Title pt will demonstrate RT knee strength to <5-/5 with < 2/10 pain to assist with walking/standing endurance (10/04/2015)   Baseline 4+/5 RT quad and hamstring strength   Time 6    Period Weeks   Status New     PT LONG TERM GOAL #4   Title pt will be able to kneel to the floor and get back up with < 2/10 pain to assist with work related activities    Baseline pain with full flexion of RT knee and 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 He presents with swelling , weakness , stiffness, pain limiting tolearnace on feet and ability to sit with knee flexed and return to full activity.   Rehab Potential Good   PT Frequency 2x / week   PT Duration 6 weeks   PT Treatment/Interventions Cryotherapy;Electrical Stimulation;Fluidtherapy;Stair training;Passive range of motion;Patient/family education;Taping;Therapeutic exercise;Therapeutic activities;Manual techniques;Manual lymph drainage   PT Next Visit Plan HEP , manual for ROM and edema, vaso   Consulted and Agree with Plan of Care Patient      Patient will benefit from skilled therapeutic intervention in order to improve the following deficits and impairments:  Decreased range of motion, Difficulty walking, Pain, Decreased activity tolerance, Increased edema, Decreased strength  Visit Diagnosis: Status post right unicompartmental knee replacement - Plan: PT plan of care cert/re-cert  Pain and swelling of right knee - Plan: PT plan of care cert/re-cert  Stiffness of right knee, not elsewhere classified - Plan: PT plan of care cert/re-cert  Localized edema - Plan: PT plan of care cert/re-cert  Difficulty in walking, not elsewhere classified - Plan: PT plan of care cert/re-cert     Problem List Patient Active  Problem List   Diagnosis Date Noted  . Primary osteoarthritis of right knee 09/24/2016  . Essential hypertension 09/24/2016  . S/P left unicompartmental knee replacement 07/22/2015    Caprice RedChasse, Fujie Dickison M  PT 11/02/2016, 11:15 AM  [facility] [address] [place], [place], [address] Phone: [phone]   Fax:  [phone]  Name: Bryson DamesJohn R Hiegel MRN: [id number] Date of Birth: [birth year]

## 2016-11-06 ENCOUNTER — Encounter: Payer: Medicaid Other | Admitting: Physical Therapy

## 2016-11-15 ENCOUNTER — Ambulatory Visit: Payer: Medicaid Other

## 2016-11-16 ENCOUNTER — Ambulatory Visit: Payer: Medicaid Other | Admitting: Physical Therapy

## 2016-11-16 ENCOUNTER — Telehealth: Payer: Self-pay | Admitting: Physical Therapy

## 2016-11-16 NOTE — Telephone Encounter (Signed)
Called patient regarding his missed appt today, 9:30.  He states he didn't know he had one today, just left the MD office.  He plans to attend his next visit Tues.  At 9:30.

## 2016-11-20 ENCOUNTER — Ambulatory Visit: Payer: Medicaid Other | Attending: Nurse Practitioner

## 2016-11-20 DIAGNOSIS — R600 Localized edema: Secondary | ICD-10-CM | POA: Diagnosis present

## 2016-11-20 DIAGNOSIS — R262 Difficulty in walking, not elsewhere classified: Secondary | ICD-10-CM | POA: Diagnosis present

## 2016-11-20 DIAGNOSIS — M25461 Effusion, right knee: Secondary | ICD-10-CM | POA: Insufficient documentation

## 2016-11-20 DIAGNOSIS — Z96651 Presence of right artificial knee joint: Secondary | ICD-10-CM | POA: Diagnosis not present

## 2016-11-20 DIAGNOSIS — M25661 Stiffness of right knee, not elsewhere classified: Secondary | ICD-10-CM | POA: Insufficient documentation

## 2016-11-20 DIAGNOSIS — M25561 Pain in right knee: Secondary | ICD-10-CM | POA: Diagnosis present

## 2016-11-20 NOTE — Therapy (Signed)
[facility] [address] [place], [place], [address] Phone: [phone]   Fax:  [phone]  Physical Therapy Treatment  Patient Details  Name: Jeffery Houston MRN: [id number] Date of Birth: 1971-09-13 Referring Provider: Margarita Rana, MD  Encounter Date: 11/20/2016      PT End of Session - 11/20/16 0938    Visit Number 2   Number of Visits 12   Date for PT Re-Evaluation 12/14/16   Authorization Type Medicaid   Authorization Time Period 12/27/16   Authorization - Visit Number 1   Authorization - Number of Visits 8   PT Start Time 0935   PT Stop Time 1035   PT Time Calculation (min) 60 min   Activity Tolerance Patient tolerated treatment well;No increased pain   Behavior During Therapy WFL for tasks assessed/performed      Past Medical History:  Diagnosis Date  . Arthritis    bil knees  . Diabetes mellitus without complication (HCC)   . Hypertension     Past Surgical History:  Procedure Laterality Date  . FOOT SURGERY    . PARTIAL KNEE ARTHROPLASTY Left 07/22/2015   Procedure: LEF PARTIAL KNEE REPLACEMENT ;  Surgeon: Sheral Apley, MD;  Location: [facility];  Service: Orthopedics;  Laterality: Left;  . PARTIAL KNEE ARTHROPLASTY Right 10/05/2016   Procedure: UNICOMPARTMENTAL KNEE;  Surgeon: Sheral Apley, MD;  Location: [facility];  Service: Orthopedics;  Laterality: Right;  Pre/Post Op femoral nerve block  . WRIST SURGERY Right    tendon repair    There were no vitals filed for this visit.      Subjective Assessment - 11/20/16 0937    Subjective Always have pain.    Currently in Pain? Yes   Pain Score 5    Pain Location Knee   Pain Orientation Right   Pain Descriptors / Indicators Aching   Pain Type Surgical pain   Pain Onset More than a month ago   Pain Frequency Constant   Aggravating Factors  standing and stretching   Pain Relieving Factors elevation /rest            [facility] PT Assessment - 11/20/16 0001      AROM   Right Knee Extension -10   Right Knee Flexion 110                     [facility] Adult PT Treatment/Exercise - 11/20/16 0939      Knee/Hip Exercises: Aerobic   Nustep L5 5 min     Knee/Hip Exercises: Standing   Wall Squat 5 seconds;15 reps     Knee/Hip Exercises: Seated   Long Arc Quad 20 reps;Right   Long Arc Quad Weight 5 lbs.  5 sec hold     Knee/Hip Exercises: Supine   Straight Leg Raises Right;2 sets;10 reps     Knee/Hip Exercises: Sidelying   Hip ABduction Right;2 sets;10 reps     Knee/Hip Exercises: Prone   Hamstring Curl 20 reps   Hamstring Curl Limitations 5 pounds   Straight Leg Raises Right;2 sets;10 reps     Manual Therapy   Manual Therapy Soft tissue mobilization;Taping   Joint Mobilization AP tibial mobs RT Gr 4   Soft tissue mobilization REtrograde and ST mobs to RT quad with tool   followed by 20 quad sets 5 sec    Passive ROM Hamstring strethcing x 3 15 sec and knee flexion    Kinesiotex Edema  Kinesiotix   Edema 2 fans RT thigh                  PT Short Term Goals - 11/20/16 1015      PT SHORT TERM GOAL #1   Baseline No program   Status On-going     PT SHORT TERM GOAL #2   Title Improve RT knee flexion to 105 degrees active    Baseline 95 degrees at eval   Time 2   Period Weeks   Status On-going           PT Long Term Goals - 11/02/16 1010      PT LONG TERM GOAL #1   Title pt will be I with all HEP given throughout therapy )   Baseline independent with intial HEP   Time 6   Period Weeks   Status New     PT LONG TERM GOAL #2   Title pt will increaes L knee flexion to >120 and extension to 0 to assist with functional and efficient gait pattern    Baseline -20 degrees at eval   Time 6   Period Weeks   Status New     PT LONG TERM GOAL #3   Title pt will demonstrate RT knee strength to <5-/5 with < 2/10 pain to assist with walking/standing endurance  (10/04/2015)   Baseline 4+/5 RT quad and hamstring strength   Time 6   Period Weeks   Status New     PT LONG TERM GOAL #4   Title pt will be able to kneel to the floor and get back up with < 2/10 pain to assist with work related activities    Baseline pain with full flexion of RT knee and 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   Time 6   Period Weeks   Status New     PT LONG TERM GOAL #5   Title pt will increase his FOTO score to > 75    to demonstrate improved function at discharge    Baseline FOTO 54 at eval   Time 6   Period Weeks   Status New               Plan - 11/20/16 [id number]    Clinical Impression Statement Pain unchanged but AROM improved    PT Treatment/Interventions Cryotherapy;Electrical Stimulation;Fluidtherapy;Stair training;Passive range of motion;Patient/family education;Taping;Therapeutic exercise;Therapeutic activities;Manual techniques;Manual lymph drainage   PT Next Visit Plan HEP , manual for ROM and edema, vaso, closed chain in clinic and for home   Consulted and Agree with Plan of Care Patient      Patient will benefit from skilled therapeutic intervention in order to improve the following deficits and impairments:  Decreased range of motion, Difficulty walking, Pain, Decreased activity tolerance, Increased edema, Decreased strength  Visit Diagnosis: Status post right unicompartmental knee replacement  Pain and swelling of right knee  Stiffness of right knee, not elsewhere classified  Localized edema  Difficulty in walking, not elsewhere classified     Problem List Patient Active Problem List   Diagnosis Date Noted  . Primary osteoarthritis of right knee 09/24/2016  . Essential hypertension 09/24/2016  . S/P left unicompartmental knee replacement 07/22/2015    Caprice RedChasse, Verda Mehta M  PT 11/20/2016, 10:17 AM  [facility] [address] [place], [place], [address] Phone: [phone]   Fax:   [phone]  Name: Jeffery Houston MRN: [id number] Date of Birth: [birth year]

## 2016-11-23 ENCOUNTER — Ambulatory Visit: Payer: Medicaid Other

## 2016-11-23 DIAGNOSIS — R600 Localized edema: Secondary | ICD-10-CM

## 2016-11-23 DIAGNOSIS — M25461 Effusion, right knee: Secondary | ICD-10-CM

## 2016-11-23 DIAGNOSIS — M25561 Pain in right knee: Secondary | ICD-10-CM

## 2016-11-23 DIAGNOSIS — M25661 Stiffness of right knee, not elsewhere classified: Secondary | ICD-10-CM

## 2016-11-23 DIAGNOSIS — Z96651 Presence of right artificial knee joint: Secondary | ICD-10-CM | POA: Diagnosis not present

## 2016-11-23 DIAGNOSIS — R262 Difficulty in walking, not elsewhere classified: Secondary | ICD-10-CM

## 2016-11-23 NOTE — Therapy (Signed)
[facility] [address] [place], [place], [address] Phone: [phone]   Fax:  [phone]  Physical Therapy Treatment  Patient Details  Name: Jeffery Houston MRN: [id number] Date of Birth: [birth year] Referring Provider: Margarita Rana, MD  Encounter Date: 11/23/2016      PT End of Session - 11/23/16 1037    Visit Number 3   Number of Visits 12   Date for PT Re-Evaluation 12/14/16   Authorization Type Medicaid   Authorization Time Period 12/27/16   Authorization - Visit Number 2   Authorization - Number of Visits 8   PT Start Time 0930   PT Stop Time 1014   PT Time Calculation (min) 44 min   Activity Tolerance Patient tolerated treatment well;No increased pain   Behavior During Therapy WFL for tasks assessed/performed      Past Medical History:  Diagnosis Date  . Arthritis    bil knees  . Diabetes mellitus without complication (HCC)   . Hypertension     Past Surgical History:  Procedure Laterality Date  . FOOT SURGERY    . PARTIAL KNEE ARTHROPLASTY Left 07/22/2015   Procedure: LEF PARTIAL KNEE REPLACEMENT ;  Surgeon: Jeffery Apley, MD;  Location: [facility];  Service: Orthopedics;  Laterality: Left;  . PARTIAL KNEE ARTHROPLASTY Right 10/05/2016   Procedure: UNICOMPARTMENTAL KNEE;  Surgeon: Jeffery Apley, MD;  Location: [facility];  Service: Orthopedics;  Laterality: Right;  Pre/Post Op femoral nerve block  . WRIST SURGERY Right    tendon repair    There were no vitals filed for this visit.      Subjective Assessment - 11/23/16 0944    Subjective Less pain 2/10 today   Currently in Pain? Yes   Pain Score 2    Pain Location Knee   Pain Orientation Right   Pain Descriptors / Indicators Aching   Pain Type Surgical pain   Pain Onset More than a month ago   Pain Frequency Constant   Aggravating Factors  stand stretch   Pain Relieving Factors elevate /rest                          [facility] Adult PT Treatment/Exercise - 11/23/16 0001      Ambulation/Gait   Gait Comments minimal decr weight , much improved      Knee/Hip Exercises: Aerobic   Nustep L8 8 min LE only     Knee/Hip Exercises: Standing   Wall Squat 15 reps;3 seconds   Other Standing Knee Exercises side steps 15 feet RT/LT with green band around knees 6 trips then stancde RT / Lt with opposit leg slides on towel to standing stability and strength , advised to flex RTR knee to comfort  and not have knee in front of toes   Other Standing Knee Exercises single leg stand RT and Lt on foam x 10 reps with RT bette than LT generally but LT and RT max was 15 seconds.  then stand on foam with toe touches x 15 RT and LT.      Knee/Hip Exercises: Seated   Long Arc Quad 20 reps;Right   Long Arc Quad Weight 8 lbs.  5 sec hold     Knee/Hip Exercises: Supine   Bridges Limitations x12 bilateral  then single leg bridge x 12 RT and LT                PT  Education - 11/23/16 1036    Education provided Yes   Education Details discussed need to work on balance for leg strength and improved walking , Stance with leg slide RT and LT. He declined handout          PT Short Term Goals - 11/20/16 1015      PT SHORT TERM GOAL #1   Baseline No program   Status On-going     PT SHORT TERM GOAL #2   Title Improve RT knee flexion to 105 degrees active    Baseline 95 degrees at eval   Time 2   Period Weeks   Status On-going           PT Long Term Goals - 11/23/16 1133      PT LONG TERM GOAL #1   Title pt will be I with all HEP given throughout therapy )   Status On-going     PT LONG TERM GOAL #2   Title pt will increaes L knee flexion to >120 and extension to 0 to assist with functional and efficient gait pattern    Status Unable to assess     PT LONG TERM GOAL #3   Title pt will demonstrate RT knee strength to <5-/5 with < 2/10 pain to assist with walking/standing  endurance (10/04/2015)   Status Unable to assess     PT LONG TERM GOAL #4   Title pt will be able to kneel to the floor and get back up with < 2/10 pain to assist with work related activities    Status Unable to assess     PT LONG TERM GOAL #5   Title pt will increase his FOTO score to > 75    to demonstrate improved function at discharge    Status Unable to assess               Plan - 11/23/16 1038    Clinical Impression Statement Pain decreased and walking is better , almost normal . Declined cold due to cold outside.  Balance decr R but was able to match LT leg at 15 sec . Improveiong . continue strength   PT Treatment/Interventions Cryotherapy;Electrical Stimulation;Fluidtherapy;Stair training;Passive range of motion;Patient/family education;Taping;Therapeutic exercise;Therapeutic activities;Manual techniques;Manual lymph drainage   PT Next Visit Plan HEP , manual for ROM and edema, vaso, closed chain in clinic and for home..   PT Home Exercise Plan stance one leg with slide opposite leg.    Consulted and Agree with Plan of Care Patient      Patient will benefit from skilled therapeutic intervention in order to improve the following deficits and impairments:  Decreased range of motion, Difficulty walking, Pain, Decreased activity tolerance, Increased edema, Decreased strength  Visit Diagnosis: Status post right unicompartmental knee replacement  Pain and swelling of right knee  Stiffness of right knee, not elsewhere classified  Localized edema  Difficulty in walking, not elsewhere classified     Problem List Patient Active Problem List   Diagnosis Date Noted  . Primary osteoarthritis of right knee 09/24/2016  . Essential hypertension 09/24/2016  . S/P left unicompartmental knee replacement 07/22/2015    Jeffery Houston  PT 11/23/2016, 11:34 AM  [facility] [address] [place], [place], [address] Phone:  [phone]   Fax:  [phone]  Name: Jeffery Houston MRN: [id number] Date of Birth: Mar 25, 1971

## 2016-11-27 ENCOUNTER — Ambulatory Visit: Payer: Medicaid Other

## 2016-11-27 DIAGNOSIS — M25661 Stiffness of right knee, not elsewhere classified: Secondary | ICD-10-CM

## 2016-11-27 DIAGNOSIS — M25461 Effusion, right knee: Secondary | ICD-10-CM

## 2016-11-27 DIAGNOSIS — R262 Difficulty in walking, not elsewhere classified: Secondary | ICD-10-CM

## 2016-11-27 DIAGNOSIS — R600 Localized edema: Secondary | ICD-10-CM

## 2016-11-27 DIAGNOSIS — M25561 Pain in right knee: Secondary | ICD-10-CM

## 2016-11-27 DIAGNOSIS — Z96651 Presence of right artificial knee joint: Secondary | ICD-10-CM

## 2016-11-27 NOTE — Therapy (Addendum)
[place], [place], [address] Phone: [phone]   Fax:  [phone]  Physical Therapy Treatment/ Discharge  Patient Details  Name: JOHNATTAN STRASSMAN MRN: [id number] Date of Birth: 10-06-1971 Referring Provider: Edmonia Lynch, MD  Encounter Date: 11/27/2016      PT End of Session - 11/27/16 0942    Visit Number 4   Number of Visits 8   Date for PT Re-Evaluation 12/14/16   Authorization Type Medicaid   Authorization Time Period 12/27/16   Authorization - Visit Number 2   Authorization - Number of Visits 8   PT Start Time 0937  late 6 min   PT Stop Time 1017   PT Time Calculation (min) 40 min   Activity Tolerance Patient tolerated treatment well   Behavior During Therapy [facility] for tasks assessed/performed      Past Medical History:  Diagnosis Date  . Arthritis    bil knees  . Diabetes mellitus without complication ([place])   . Hypertension     Past Surgical History:  Procedure Laterality Date  . FOOT SURGERY    . PARTIAL KNEE ARTHROPLASTY Left 07/22/2015   Procedure: LEF PARTIAL KNEE REPLACEMENT ;  Surgeon: Renette Butters, MD;  Location: [facility];  Service: Orthopedics;  Laterality: Left;  . PARTIAL KNEE ARTHROPLASTY Right 10/05/2016   Procedure: UNICOMPARTMENTAL KNEE;  Surgeon: Renette Butters, MD;  Location: [facility];  Service: Orthopedics;  Laterality: Right;  Pre/Post Op femoral nerve block  . WRIST SURGERY Right    tendon repair    There were no vitals filed for this visit.      Subjective Assessment - 11/27/16 6962    Subjective Saw MD yesterday. Started me on anti inflamatant meds . Just started then so no changes so far. MD said cold weather  makes knee hurt more.   Md said knee healing up fine. RT MD 02/24/17   Currently in Pain? Yes   Pain Score 5    Pain Location Knee   Pain Orientation Right;Medial   Pain Descriptors / Indicators Aching   Pain Type  Surgical pain   Pain Onset More than a month ago   Pain Frequency Constant   Aggravating Factors  standing / stretching /walking   Pain Relieving Factors elevate/ rest/            [facility] PT Assessment - 11/27/16 0001      AROM   Right Knee Extension -6   Right Knee Flexion 113                     [facility] Adult PT Treatment/Exercise - 11/27/16 0001      Knee/Hip Exercises: Stretches   Gastroc Stretch Right;Left;60 seconds   Gastroc Stretch Limitations verbal /tactile and demo cues     Knee/Hip Exercises: Aerobic   Nustep L6 6 min Le only     Knee/Hip Exercises: Standing   Lateral Step Up Right;15 reps;Hand Hold: 1;Step Height: 8"   Wall Squat 15 reps;3 seconds     Knee/Hip Exercises: Seated   Long Arc Quad 20 reps;Right   Long Arc Quad Weight 5 lbs.     Knee/Hip Exercises: Supine   Straight Leg Raises Right;2 sets;10 reps     Knee/Hip Exercises: Prone   Hamstring Curl 20 reps   Hamstring Curl Limitations 3   Prone Knee Hang 3 minutes   Prone Knee Hang Weights (lbs) 0  Straight Leg Raises Right;2 sets;10 reps     Manual Therapy   Joint Mobilization AP tibial mobs RT Gr 4   Passive ROM Hamstring strethcing x 2 15 sec and knee flexion      Kinesiotix   Edema cros over media; knee and one strip ove medial knee to quads                  PT Short Term Goals - 11/20/16 1015      PT SHORT TERM GOAL #1   Baseline No program   Status On-going     PT SHORT TERM GOAL #2   Title Improve RT knee flexion to 105 degrees active    Baseline 95 degrees at eval   Time 2   Period Weeks   Status On-going           PT Long Term Goals - 11/23/16 1133      PT LONG TERM GOAL #1   Title pt will be I with all HEP given throughout therapy )   Status On-going     PT LONG TERM GOAL #2   Title pt will increaes L knee flexion to >120 and extension to 0 to assist with functional and efficient gait pattern    Status Unable to assess     PT LONG TERM  GOAL #3   Title pt will demonstrate RT knee strength to <5-/5 with < 2/10 pain to assist with walking/standing endurance (10/04/2015)   Status Unable to assess     PT LONG TERM GOAL #4   Title pt will be able to kneel to the floor and get back up with < 2/10 pain to assist with work related activities    Status Unable to assess     PT LONG TERM GOAL #5   Title pt will increase his FOTO score to > 75    to demonstrate improved function at discharge    Status Unable to assess               Plan - 11/27/16 0941    Clinical Impression Statement More pain than last session but he was able to perform the activity with decreased weight. He declined modalities and he needs to stretch DF as this is limited to 90 degrees bilaterlaly and may impact knee pain.    PT Treatment/Interventions Cryotherapy;Electrical Stimulation;Fluidtherapy;Stair training;Passive range of motion;Patient/family education;Taping;Therapeutic exercise;Therapeutic activities;Manual techniques;Manual lymph drainage   PT Next Visit Plan HEP , manual for ROM and edema, vaso, closed chain in clinic and for home..   PT Home Exercise Plan stance one leg with slide opposite leg. , heel cord stretch RT/Lt     Consulted and Agree with Plan of Care Patient      Patient will benefit from skilled therapeutic intervention in order to improve the following deficits and impairments:  Decreased range of motion, Difficulty walking, Pain, Decreased activity tolerance, Increased edema, Decreased strength  Visit Diagnosis: Status post right unicompartmental knee replacement  Pain and swelling of right knee  Stiffness of right knee, not elsewhere classified  Localized edema  Difficulty in walking, not elsewhere classified     Problem List Patient Active Problem List   Diagnosis Date Noted  . Primary osteoarthritis of right knee 09/24/2016  . Essential hypertension 09/24/2016  . S/P left unicompartmental knee replacement  07/22/2015    Darrel Hoover  PT 11/27/2016, 11:08 AM  [place], [place],  [address] Phone: [phone]   Fax:  [phone]  Name: JAYSIN GAYLER MRN: [id number] Date of Birth: May 12, 1971  PHYSICAL THERAPY DISCHARGE SUMMARY  Visits from Start of Care:4  Current functional level related to goals / functional outcomes: See above. He did not return after this visit. He noshoweed 3 appointments and canceled 2   Remaining deficits: Unknown See above   Education / Equipment: HEP Plan:                                                    Patient goals were not met. Patient is being discharged due to not returning since the last visit.  ?????    Lillette Boxer Lexx Monte  Pt  01/29/17  12:00 Noon

## 2016-11-30 ENCOUNTER — Telehealth: Payer: Self-pay

## 2016-11-30 ENCOUNTER — Ambulatory Visit: Payer: Medicaid Other

## 2016-11-30 NOTE — Telephone Encounter (Signed)
Called pt and he reports he forgot about appointment. He will call to reschedule and add for 2 x /week for 2 weeks

## 2017-02-16 ENCOUNTER — Emergency Department (HOSPITAL_COMMUNITY)
Admission: EM | Admit: 2017-02-16 | Discharge: 2017-02-16 | Disposition: A | Discharge status: Home / Self Care | Payer: Medicaid Other | Attending: Emergency Medicine | Admitting: Emergency Medicine

## 2017-02-16 ENCOUNTER — Encounter (HOSPITAL_COMMUNITY): Payer: Self-pay | Admitting: *Deleted

## 2017-02-16 DIAGNOSIS — Y939 Activity, unspecified: Secondary | ICD-10-CM | POA: Insufficient documentation

## 2017-02-16 DIAGNOSIS — Y929 Unspecified place or not applicable: Secondary | ICD-10-CM | POA: Insufficient documentation

## 2017-02-16 DIAGNOSIS — Z7982 Long term (current) use of aspirin: Secondary | ICD-10-CM | POA: Insufficient documentation

## 2017-02-16 DIAGNOSIS — H16133 Photokeratitis, bilateral: Secondary | ICD-10-CM

## 2017-02-16 DIAGNOSIS — F17200 Nicotine dependence, unspecified, uncomplicated: Secondary | ICD-10-CM | POA: Insufficient documentation

## 2017-02-16 DIAGNOSIS — W890XXA Exposure to welding light (arc), initial encounter: Secondary | ICD-10-CM | POA: Diagnosis not present

## 2017-02-16 DIAGNOSIS — Y999 Unspecified external cause status: Secondary | ICD-10-CM | POA: Diagnosis not present

## 2017-02-16 DIAGNOSIS — I10 Essential (primary) hypertension: Secondary | ICD-10-CM | POA: Diagnosis not present

## 2017-02-16 DIAGNOSIS — Z91040 Latex allergy status: Secondary | ICD-10-CM | POA: Diagnosis not present

## 2017-02-16 DIAGNOSIS — Z96653 Presence of artificial knee joint, bilateral: Secondary | ICD-10-CM | POA: Diagnosis not present

## 2017-02-16 DIAGNOSIS — H5713 Ocular pain, bilateral: Secondary | ICD-10-CM | POA: Diagnosis present

## 2017-02-16 DIAGNOSIS — E119 Type 2 diabetes mellitus without complications: Secondary | ICD-10-CM | POA: Diagnosis not present

## 2017-02-16 DIAGNOSIS — Z7984 Long term (current) use of oral hypoglycemic drugs: Secondary | ICD-10-CM | POA: Diagnosis not present

## 2017-02-16 MED ORDER — OXYCODONE-ACETAMINOPHEN 5-325 MG PO TABS: 1.0000 | ORAL_TABLET | ORAL | 0 refills | Status: DC | PRN

## 2017-02-16 MED ORDER — FLUORESCEIN SODIUM 0.6 MG OP STRP
1.0000 | ORAL_STRIP | Freq: Once | OPHTHALMIC | Status: AC
  Administered 2017-02-16: 1 via OPHTHALMIC
  Filled 2017-02-16: qty 1

## 2017-02-16 MED ORDER — ERYTHROMYCIN 5 MG/GM OP OINT
1.0000 "application " | TOPICAL_OINTMENT | Freq: Four times a day (QID) | OPHTHALMIC | Status: DC
  Filled 2017-02-16: qty 3.5

## 2017-02-16 MED ORDER — KETOROLAC TROMETHAMINE 0.5 % OP SOLN
1.0000 [drp] | Freq: Four times a day (QID) | OPHTHALMIC | Status: DC
  Administered 2017-02-16: 1 [drp] via OPHTHALMIC
  Filled 2017-02-16: qty 3

## 2017-02-16 MED ORDER — TETRACAINE HCL 0.5 % OP SOLN
2.0000 [drp] | Freq: Once | OPHTHALMIC | Status: AC
Start: 2017-02-16 — End: 2017-02-16
  Administered 2017-02-16: 2 [drp] via OPHTHALMIC
  Filled 2017-02-16: qty 2

## 2017-02-16 NOTE — ED Triage Notes (Signed)
Pt to ED by EMS c/o eye pain. 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

## 2017-02-16 NOTE — Discharge Instructions (Signed)
Ultraviolet Keratitis Ultraviolet keratitis is a condition that happens when too much ultraviolet (UV) light gets into your eye. This can happen if you are around: Direct sunlight. Sunlight and snow, sand, or water. Welding arcs. Halogen lamps.  The problems caused by this condition usually start 6-12 hours after too much light gets into your eye. These problems can include: Tears. Being sensitive to light. Feeling that there is something in your eye. Swollen eyelids. Very bad eye pain.  This condition will usually get better on its own in 24-48 hours. Follow these instructions at home: If directed, put ice on your eye: Put ice in a plastic bag. Place a towel between your skin and the bag. Leave the ice on for 10-20 minutes, 2-8 times a day. Take over-the-counter and prescription medicines only as told by your doctor. If you were prescribed an antibiotic ointment, apply it as told by your doctor. Do not stop using the antibiotic even if your condition improves. Wear an eye patch as told by your doctor. If your doctor puts patches on your eyes, it is important to leave them on. Do not rub your eyes. Keep all follow-up visits as told by your doctor. This is important. If you miss these visits, you may get a very bad eye infection or lose your vision. Get help right away if: Your pain is very bad and medicines do not help. Your vision gets much worse. You have white or yellow fluid on your eye. You have a white spot on your eye. Your pain or vision problems last more than 48 hours. This information is not intended to replace advice given to you by your health care provider. Make sure you discuss any questions you have with your health care provider. Document Released: 10/24/2009 Document Revised: 07/08/2016 Document Reviewed: 01/02/2016 Elsevier Interactive Patient Education  2017 [employer].

## 2017-02-16 NOTE — ED Provider Notes (Signed)
[facility]-EMERGENCY DEPT Provider Note   CSN: [id number] Arrival date & time: 02/16/17  0416     History   Chief Complaint Chief Complaint  Patient presents with  . Eye Pain    HPI Jeffery Houston is a 46 y.o. male.  Presents with complaints of severe bilateral eye pain. 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 The course of the evening and night he has had progressive worsening pain in both of his eyes.      Past Medical History:  Diagnosis Date  . Arthritis    bil knees  . Diabetes mellitus without complication (HCC)   . Hypertension     Patient Active Problem List   Diagnosis Date Noted  . Primary osteoarthritis of right knee 09/24/2016  . Essential hypertension 09/24/2016  . 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 FOOT SURGERY    . PARTIAL KNEE ARTHROPLASTY Left 07/22/2015   Procedure: LEF PARTIAL KNEE REPLACEMENT ;  Surgeon: Sheral Apley, MD;  Location: [facility];  Service: Orthopedics;  Laterality: Left;  . PARTIAL KNEE ARTHROPLASTY Right 10/05/2016   Procedure: UNICOMPARTMENTAL KNEE;  Surgeon: Sheral Apley, MD;  Location: [facility];  Service: Orthopedics;  Laterality: Right;  Pre/Post Op femoral nerve block  . WRIST SURGERY Right    tendon repair       Home Medications    Prior to Admission medications   Medication Sig Start Date End Date Taking? Authorizing Provider  amLODipine (NORVASC) 10 MG tablet Take 10 mg by mouth daily. 01/11/15   Historical Provider, MD  aspirin EC 325 MG tablet Take 1 tablet (325 mg total) by mouth daily. 10/05/16   Lucretia Kern Martensen III, PA-C  docusate sodium (COLACE) 100 MG capsule Take 1 capsule (100 mg total) by mouth 2 (two) times daily. 10/05/16   Lucretia Kern Martensen III, PA-C  ibuprofen (ADVIL,MOTRIN) 600 MG tablet Take 1 tablet (600  mg total) by mouth every 6 (six) hours as needed for moderate pain. 04/10/16   Charm Rings, MD  lisinopril (PRINIVIL,ZESTRIL) 10 MG tablet Take 10 mg by mouth daily.    Historical Provider, MD  metFORMIN (GLUCOPHAGE) 500 MG tablet Take 500 mg by mouth 2 (two) times daily. 02/15/15   Historical Provider, MD  methocarbamol (ROBAXIN) 500 MG tablet Take 1 tablet (500 mg total) by mouth every 6 (six) hours as needed for muscle spasms. 10/05/16   Lucretia Kern Martensen III, PA-C  omeprazole (PRILOSEC) 20 MG capsule Take 1 capsule (20 mg total) by mouth daily. While taking anti inflammatory medicine daily 10/05/16   Lucretia Kern Martensen III, PA-C  ondansetron (ZOFRAN) 4 MG tablet Take 1 tablet (4 mg total) by mouth every 8 (eight) hours as needed for nausea or vomiting. Patient not taking: Reported on 11/02/2016 10/05/16   Albina Billet III, PA-C  oxyCODONE-acetaminophen (PERCOCET) 5-325 MG tablet Take 1-2 tablets by mouth every 4 (four) hours as needed. 02/16/17   Gilda Crease, MD  PRESCRIPTION MEDICATION Apply 1 application topically daily as needed (eczema).    Historical Provider, MD    Family History No family history on file.  Social History Social History  Substance Use Topics  . Smoking status: Current Every Day Smoker    Last attempt to quit: 05/14/2010  . Smokeless tobacco: Former Neurosurgeon  . Alcohol  use Yes     Comment: social     Allergies   Latex   Review of Systems Review of Systems  Eyes: Positive for pain.  All other systems reviewed and are negative.    Physical Exam Updated Vital Signs BP (!) 149/103   Pulse 88   Temp 98.5 F (36.9 C)   Resp 18   SpO2 95%   Physical Exam  Constitutional: He is oriented to person, place, and time. He appears well-developed and well-nourished. No distress.  HENT:  Head: Normocephalic and atraumatic.  Right Ear: Hearing normal.  Left Ear: Hearing normal.  Nose: Nose normal.  Mouth/Throat: Oropharynx is clear  and moist and mucous membranes are normal.  Eyes: EOM are normal. Pupils are equal, round, and reactive to light. Right eye exhibits no chemosis, no discharge and no exudate. No foreign body present in the right eye. Left eye exhibits no chemosis, no discharge and no exudate. No foreign body present in the left eye. Right conjunctiva is injected. Left conjunctiva is injected.  Fluoroscein Wood's Lamp Exam: bilateral punctate uptake, no ulceration  Neck: Normal range of motion. Neck supple.  Cardiovascular: Regular rhythm, S1 normal and S2 normal.  Exam reveals no gallop and no friction rub.   No murmur heard. Pulmonary/Chest: Effort normal and breath sounds normal. No respiratory distress. He exhibits no tenderness.  Abdominal: Soft. Normal appearance and bowel sounds are normal. There is no hepatosplenomegaly. There is no tenderness. There is no rebound, no guarding, no tenderness at McBurney's point and negative Murphy's sign. No hernia.  Musculoskeletal: Normal range of motion.  Neurological: He is alert and oriented to person, place, and time. He has normal strength. No cranial nerve deficit or sensory deficit. Coordination normal. GCS eye subscore is 4. GCS verbal subscore is 5. GCS motor subscore is 6.  Skin: Skin is warm, dry and intact. No rash noted. No cyanosis.  Psychiatric: He has a normal mood and affect. His speech is normal and behavior is normal. Thought content normal.  Nursing note and vitals reviewed.    ED Treatments / Results  Labs (all labs ordered are listed, but only abnormal results are displayed) Labs Reviewed - No data to display  EKG  EKG Interpretation None       Radiology No results found.  Procedures Procedures (including critical care time)  Medications Ordered in ED Medications  fluorescein ophthalmic strip 1 strip (not administered)  tetracaine (PONTOCAINE) 0.5 % ophthalmic solution 2 drop (not administered)  ketorolac (ACULAR) 0.5 % ophthalmic  solution 1 drop (not administered)  erythromycin ophthalmic ointment 1 application (not administered)     Initial Impression / Assessment and Plan / ED Course  I have reviewed the triage vital signs and the nursing notes.  Pertinent labs & imaging results that were available during my care of the patient were reviewed by me and considered in my medical decision making (see chart for details).     Patient presents to the ER for evaluation of severe bilateral eye pain. He was helping someone welled earlier today without wearing eye protection. No evidence of foreign body or ulceration on examination. He does have punctate uptake of fluorescein consistent with UV keratitis. Patient will be treated with analgesia. He had immediate relief of his pain with topical tetracaine here in the ER.  Final Clinical Impressions(s) / ED Diagnoses   Final diagnoses:  Welders' flash, bilateral    New Prescriptions New Prescriptions   OXYCODONE-ACETAMINOPHEN (PERCOCET) 5-325 MG TABLET  Take 1-2 tablets by mouth every 4 (four) hours as needed.     Gilda Crease, MD 02/16/17 [phone]

## 2017-11-07 IMAGING — CR DG KNEE 1-2V PORT*R*
2 series · 2 of 2 positions shown · non-contrast
Comparison: 03/24/2013

CLINICAL DATA: Postop right partial knee replacement

EXAM:
PORTABLE RIGHT KNEE - 1-2 VIEW

[ap/obl knee]
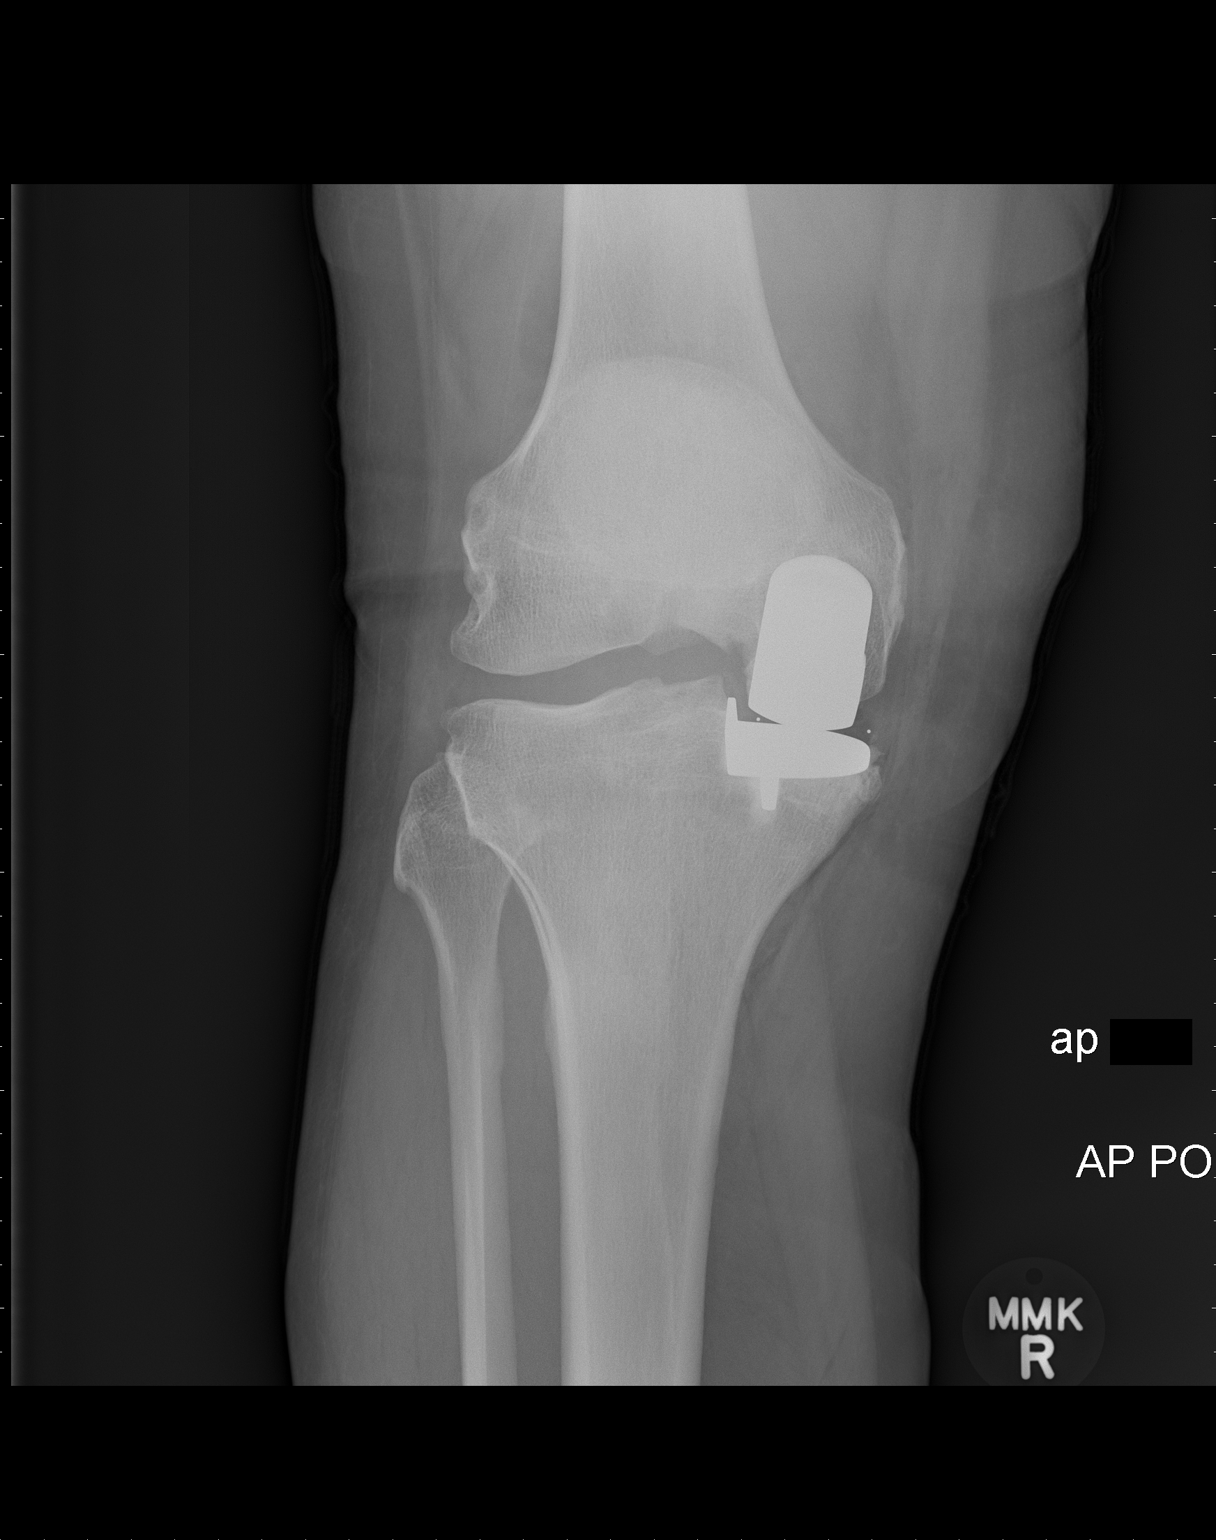

[knee lat]
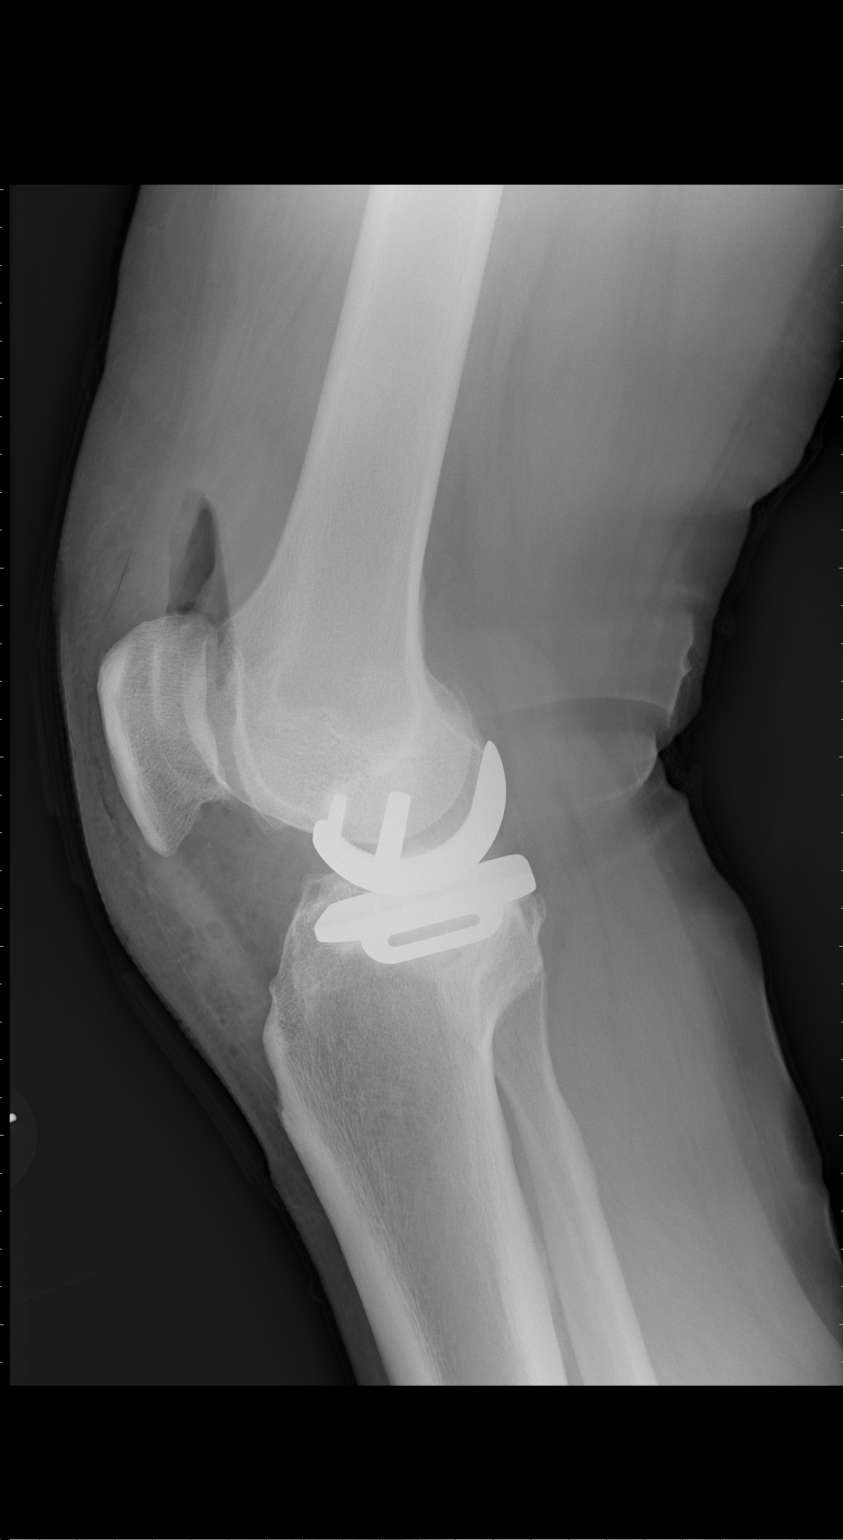

[2 of 2 positions shown; findings below may reference images not displayed]

FINDINGS: Changes of medial compartment hemiarthroplasty of the right knee. No
hardware or bony complicating feature. Soft tissue and joint space
gas noted.
IMPRESSION: Unicompartmental knee replacement.  No complicating feature.

## 2018-03-04 DIAGNOSIS — E0800 Diabetes mellitus due to underlying condition with hyperosmolarity without nonketotic hyperglycemic-hyperosmolar coma (NKHHC): Secondary | ICD-10-CM

## 2018-03-04 DIAGNOSIS — I10 Essential (primary) hypertension: Secondary | ICD-10-CM

## 2018-03-05 ENCOUNTER — Telehealth: Payer: Self-pay

## 2018-03-05 LAB — GLUCOSE, POCT (MANUAL RESULT ENTRY): POC Glucose: 175 mg/dl (ref 70–99)

## 2018-03-05 NOTE — Congregational Nurse Program (Signed)
Congregational Nurse Program Note  Date of Encounter: 03/04/2018  Past Medical History: Past Medical History:  Diagnosis Date  . Arthritis    bil knees  . Diabetes mellitus without complication (HCC)   . Hypertension     Encounter Details: CNP Questionnaire - 03/05/18 1624      Questionnaire   Patient Status  Not Applicable    Race  Black or African American    Location Patient Served At  Not Applicable    Insurance  Medicaid    Uninsured  Not Applicable    Food  No food insecurities    Housing/Utilities  No permanent housing    Transportation  No transportation needs    Interpersonal Safety  Yes, feel physically and emotionally safe where you currently live    Medication  Yes, have medication insecurities;Provided medication assistance    Medical Provider  Yes    Referrals  Primary Care Provider/Clinic    ED Visit Averted  Not Applicable    Life-Saving Intervention Made  Not Applicable     Initial visit to see nurse states he isn't happy with his present PCP,[facility] ,requesting medications be switched to [facility] and help with identifying a new provider for him and his family members. [facility] blood sugars range usually from 94-96 mg when he takes his medications ,diagnosed about 1 year ago with diabetes ,has high blood pressure and takes medication for it . Complaints of lower back pains and has ask for a referral but has never been referred. Takes 10 units of insulin 2 times per day he reports. Nurse gave family several practices to call for PCP and [facility] ,states he has some refills  on all his medications.  Counseled  regarding taking medications and his blood sugar levels .Nurse will assist family with getting medical care where they feel more comfortable and medications in order. Family to return on 03-05-18 to get an update on next steps.

## 2018-03-05 NOTE — Telephone Encounter (Signed)
Nurse took referral to pharmacy after learning that it had not came through to get medications transferred  ,will deliver on tomorrow.

## 2018-03-11 ENCOUNTER — Telehealth: Payer: Self-pay

## 2018-03-11 NOTE — Telephone Encounter (Signed)
Referral at pharmacy  But no transfer as of yet ,will call [facility] to check on status and call nurse back with status

## 2018-03-18 ENCOUNTER — Other Ambulatory Visit: Payer: Self-pay | Admitting: Sports Medicine

## 2018-03-18 DIAGNOSIS — M25511 Pain in right shoulder: Secondary | ICD-10-CM

## 2018-03-22 ENCOUNTER — Ambulatory Visit
Admission: RE | Admit: 2018-03-22 | Discharge: 2018-03-22 | Disposition: A | Discharge status: Home / Self Care | Payer: Medicaid Other | Source: Ambulatory Visit | Attending: Sports Medicine | Admitting: Sports Medicine

## 2018-03-22 ENCOUNTER — Other Ambulatory Visit (HOSPITAL_COMMUNITY): Payer: Self-pay | Admitting: Radiology

## 2018-03-22 ENCOUNTER — Ambulatory Visit
Admission: RE | Admit: 2018-03-22 | Discharge: 2018-03-22 | Disposition: A | Discharge status: Home / Self Care | Payer: Medicaid Other | Source: Ambulatory Visit | Attending: Radiology | Admitting: Radiology

## 2018-03-22 DIAGNOSIS — S0550XA Penetrating wound with foreign body of unspecified eyeball, initial encounter: Secondary | ICD-10-CM

## 2018-03-22 DIAGNOSIS — M25511 Pain in right shoulder: Secondary | ICD-10-CM

## 2018-10-20 ENCOUNTER — Emergency Department (HOSPITAL_COMMUNITY): Payer: Self-pay

## 2018-10-20 ENCOUNTER — Emergency Department (HOSPITAL_COMMUNITY)
Admission: EM | Admit: 2018-10-20 | Discharge: 2018-10-20 | Disposition: A | Discharge status: Home / Self Care | Payer: Self-pay | Attending: Emergency Medicine | Admitting: Emergency Medicine

## 2018-10-20 ENCOUNTER — Encounter (HOSPITAL_COMMUNITY): Payer: Self-pay | Admitting: *Deleted

## 2018-10-20 DIAGNOSIS — W230XXA Caught, crushed, jammed, or pinched between moving objects, initial encounter: Secondary | ICD-10-CM | POA: Insufficient documentation

## 2018-10-20 DIAGNOSIS — L03011 Cellulitis of right finger: Secondary | ICD-10-CM | POA: Insufficient documentation

## 2018-10-20 DIAGNOSIS — Y999 Unspecified external cause status: Secondary | ICD-10-CM | POA: Insufficient documentation

## 2018-10-20 DIAGNOSIS — Z96653 Presence of artificial knee joint, bilateral: Secondary | ICD-10-CM | POA: Insufficient documentation

## 2018-10-20 DIAGNOSIS — Y929 Unspecified place or not applicable: Secondary | ICD-10-CM | POA: Insufficient documentation

## 2018-10-20 DIAGNOSIS — E119 Type 2 diabetes mellitus without complications: Secondary | ICD-10-CM | POA: Insufficient documentation

## 2018-10-20 DIAGNOSIS — Z7984 Long term (current) use of oral hypoglycemic drugs: Secondary | ICD-10-CM | POA: Insufficient documentation

## 2018-10-20 DIAGNOSIS — Z79899 Other long term (current) drug therapy: Secondary | ICD-10-CM | POA: Insufficient documentation

## 2018-10-20 DIAGNOSIS — I10 Essential (primary) hypertension: Secondary | ICD-10-CM | POA: Insufficient documentation

## 2018-10-20 DIAGNOSIS — Z91040 Latex allergy status: Secondary | ICD-10-CM | POA: Insufficient documentation

## 2018-10-20 DIAGNOSIS — Z7982 Long term (current) use of aspirin: Secondary | ICD-10-CM | POA: Insufficient documentation

## 2018-10-20 DIAGNOSIS — S61212A Laceration without foreign body of right middle finger without damage to nail, initial encounter: Secondary | ICD-10-CM | POA: Insufficient documentation

## 2018-10-20 DIAGNOSIS — Y9389 Activity, other specified: Secondary | ICD-10-CM | POA: Insufficient documentation

## 2018-10-20 DIAGNOSIS — F17200 Nicotine dependence, unspecified, uncomplicated: Secondary | ICD-10-CM | POA: Insufficient documentation

## 2018-10-20 MED ORDER — DOXYCYCLINE HYCLATE 100 MG PO TABS
100.0000 mg | ORAL_TABLET | Freq: Once | ORAL | Status: AC
  Administered 2018-10-20: 100 mg via ORAL
  Filled 2018-10-20: qty 1

## 2018-10-20 MED ORDER — DOXYCYCLINE HYCLATE 100 MG PO CAPS: 100.0000 mg | ORAL_CAPSULE | Freq: Two times a day (BID) | ORAL | 0 refills | Status: DC

## 2018-10-20 NOTE — ED Notes (Signed)
Pt stable, ambulatory, states understanding of discharge instructions 

## 2018-10-20 NOTE — Discharge Instructions (Signed)
Take the antibiotics and elevate.  Return or see hand surgery if symptoms are getting worse.  Tylenol as needed for pain.

## 2018-10-20 NOTE — ED Triage Notes (Signed)
Pt in c/o injury to his right hand that happened last week, c/o continued swelling and pain

## 2018-10-20 NOTE — ED Provider Notes (Signed)
[facility] EMERGENCY DEPARTMENT Provider Note   CSN: [id number] Arrival date & time: 10/20/18  1635     History   Chief Complaint Chief Complaint  Patient presents with  . Hand Injury    HPI Jeffery Houston is a 47 y.o. male.  The history is provided by the patient.  Hand Injury   Incident onset: 6 days ago pt was moving furniture and got his finger stuck btween a couch and another piece of furniture which cut his finger open..  It seemed to be getting better however 2 days ago it started to have more pain, mild redness and swelling.  He denies any. The incident occurred at home. The injury mechanism was compression. The pain is present in the right fingers. The quality of the pain is described as aching and throbbing. The pain is at a severity of 4/10. The pain is moderate. The pain has been constant since the incident. Pertinent negatives include no fever. Associated symptoms comments: Swelling and redness of the finger but no pus drainage. He reports no foreign bodies present. The symptoms are aggravated by movement, use and palpation. He has tried nothing for the symptoms. The treatment provided no relief.    Past Medical History:  Diagnosis Date  . Arthritis    bil knees  . Diabetes mellitus without complication (HCC)   . Hypertension     Patient Active Problem List   Diagnosis Date Noted  . Primary osteoarthritis of right knee 09/24/2016  . Essential hypertension 09/24/2016  . 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 FOOT SURGERY    . PARTIAL KNEE ARTHROPLASTY Left 07/22/2015   Procedure: LEF PARTIAL KNEE REPLACEMENT ;  Surgeon: Sheral Apley, MD;  Location: [facility];  Service: Orthopedics;  Laterality: Left;  . PARTIAL KNEE ARTHROPLASTY Right 10/05/2016   Procedure: UNICOMPARTMENTAL KNEE;  Surgeon: Sheral Apley, MD;  Location: [facility];   Service: Orthopedics;  Laterality: Right;  Pre/Post Op femoral nerve block  . WRIST SURGERY Right    tendon repair        Home Medications    Prior to Admission medications   Medication Sig Start Date End Date Taking? Authorizing Provider  amLODipine (NORVASC) 10 MG tablet Take 10 mg by mouth daily. 01/11/15   [provider]  aspirin EC 325 MG tablet Take 1 tablet (325 mg total) by mouth daily. 10/05/16   Albina Billet III, PA-C  docusate sodium (COLACE) 100 MG capsule Take 1 capsule (100 mg total) by mouth 2 (two) times daily. 10/05/16   Albina Billet III, PA-C  doxycycline (VIBRAMYCIN) 100 MG capsule Take 1 capsule (100 mg total) by mouth 2 (two) times daily. 10/20/18   Gwyneth Sprout, MD  ibuprofen (ADVIL,MOTRIN) 600 MG tablet Take 1 tablet (600 mg total) by mouth every 6 (six) hours as needed for moderate pain. 04/10/16   Charm Rings, MD  lisinopril (PRINIVIL,ZESTRIL) 10 MG tablet Take 10 mg by mouth daily.    [provider]  metFORMIN (GLUCOPHAGE) 500 MG tablet Take 500 mg by mouth 2 (two) times daily. 02/15/15   [provider]  methocarbamol (ROBAXIN) 500 MG tablet Take 1 tablet (500 mg total) by mouth every 6 (six) hours as needed for muscle spasms. 10/05/16   Albina Billet III, PA-C  omeprazole (PRILOSEC) 20 MG capsule Take 1 capsule (20 mg total) by mouth  daily. While taking anti inflammatory medicine daily 10/05/16   Albina Billet III, PA-C  ondansetron (ZOFRAN) 4 MG tablet Take 1 tablet (4 mg total) by mouth every 8 (eight) hours as needed for nausea or vomiting. Patient not taking: Reported on 11/02/2016 10/05/16   Albina Billet III, PA-C  oxyCODONE-acetaminophen (PERCOCET) 5-325 MG tablet Take 1-2 tablets by mouth every 4 (four) hours as needed. 02/16/17   Gilda Crease, MD  PRESCRIPTION MEDICATION Apply 1 application topically daily as needed (eczema).    [provider]     Family History History reviewed. No pertinent family history.  Social History Social History   Tobacco Use  . Smoking status: Current Every Day Smoker    Last attempt to quit: 05/14/2010    Years since quitting: 8.4  . Smokeless tobacco: Former Engineer, water Use Topics  . Alcohol use: Yes    Comment: social  . Drug use: No     Allergies   Latex   Review of Systems Review of Systems  Constitutional: Negative for fever.  All other systems reviewed and are negative.    Physical Exam Updated Vital Signs BP 136/90 (BP Location: Right Arm)   Pulse 75   Temp 98 F (36.7 C) (Oral)   Resp 16   SpO2 98%   Physical Exam  Constitutional: He is oriented to person, place, and time. He appears well-developed and well-nourished. No distress.  HENT:  Head: Normocephalic and atraumatic.  Eyes: Pupils are equal, round, and reactive to light.  Cardiovascular: Normal rate.  Pulmonary/Chest: Effort normal.  Musculoskeletal: He exhibits tenderness.       Hands: Neurological: He is alert and oriented to person, place, and time.  Skin: Skin is warm and dry. Capillary refill takes less than 2 seconds.  Psychiatric: He has a normal mood and affect. His behavior is normal.  Nursing note and vitals reviewed.    ED Treatments / Results  Labs (all labs ordered are listed, but only abnormal results are displayed) Labs Reviewed - No data to display  EKG None  Radiology Dg Hand Complete Right  Result Date: 10/20/2018 CLINICAL DATA:  Injury to right and. Patient slammed hand between a piece of furniture and a trailer. Pain over the MCP joints of the third through fifth digits. EXAM: RIGHT HAND - COMPLETE 3+ VIEW COMPARISON:  None. FINDINGS: There is no evidence of fracture or dislocation. There is no evidence of arthropathy or other focal bone abnormality. Soft tissues are unremarkable. IMPRESSION: Negative. Electronically Signed   By: Signa Kell M.D.   On: 10/20/2018 17:54     Procedures Procedures (including critical care time)  Medications Ordered in ED Medications  doxycycline (VIBRA-TABS) tablet 100 mg (has no administration in time range)     Initial Impression / Assessment and Plan / ED Course  I have reviewed the triage vital signs and the nursing notes.  Pertinent labs & imaging results that were available during my care of the patient were reviewed by me and considered in my medical decision making (see chart for details).     Patient is a 47 year old gentleman with a history of diabetes and hypertension presenting today for evaluation of a right third finger laceration.  This occurred approximately 6 days ago and had been healing but then 2 days ago started having worsening pain and swelling.  She has no systemic symptoms.  On exam his finger is swollen with some mild erythema around the laceration but no  fluctuance, induration.  No evidence of abscess at this time.  No symptoms consistent with tenosynovitis.  Patient covered with doxycycline and given return precautions.  He was also given follow-up with hand surgery if symptoms worsen.  Final Clinical Impressions(s) / ED Diagnoses   Final diagnoses:  Laceration of right middle finger without foreign body without damage to nail, initial encounter  Cellulitis of finger of right hand    ED Discharge Orders         Ordered    doxycycline (VIBRAMYCIN) 100 MG capsule  2 times daily     10/20/18 1800           Gwyneth SproutPlunkett, Houda Brau, MD 10/20/18 1812

## 2018-10-20 NOTE — ED Notes (Signed)
Patient transported to X-ray 

## 2019-01-10 ENCOUNTER — Emergency Department (HOSPITAL_COMMUNITY): Payer: Medicaid Other

## 2019-01-10 ENCOUNTER — Encounter (HOSPITAL_COMMUNITY): Payer: Self-pay | Admitting: Emergency Medicine

## 2019-01-10 ENCOUNTER — Emergency Department (HOSPITAL_COMMUNITY)
Admission: EM | Admit: 2019-01-10 | Discharge: 2019-01-11 | Disposition: A | Discharge status: Home / Self Care | Payer: Medicaid Other | Attending: Emergency Medicine | Admitting: Emergency Medicine

## 2019-01-10 DIAGNOSIS — S022XXA Fracture of nasal bones, initial encounter for closed fracture: Secondary | ICD-10-CM | POA: Diagnosis not present

## 2019-01-10 DIAGNOSIS — Z79899 Other long term (current) drug therapy: Secondary | ICD-10-CM | POA: Insufficient documentation

## 2019-01-10 DIAGNOSIS — E119 Type 2 diabetes mellitus without complications: Secondary | ICD-10-CM | POA: Insufficient documentation

## 2019-01-10 DIAGNOSIS — Y999 Unspecified external cause status: Secondary | ICD-10-CM | POA: Diagnosis not present

## 2019-01-10 DIAGNOSIS — F17200 Nicotine dependence, unspecified, uncomplicated: Secondary | ICD-10-CM | POA: Insufficient documentation

## 2019-01-10 DIAGNOSIS — Y9389 Activity, other specified: Secondary | ICD-10-CM | POA: Insufficient documentation

## 2019-01-10 DIAGNOSIS — Y9289 Other specified places as the place of occurrence of the external cause: Secondary | ICD-10-CM | POA: Insufficient documentation

## 2019-01-10 DIAGNOSIS — Z794 Long term (current) use of insulin: Secondary | ICD-10-CM | POA: Insufficient documentation

## 2019-01-10 DIAGNOSIS — Z7982 Long term (current) use of aspirin: Secondary | ICD-10-CM | POA: Insufficient documentation

## 2019-01-10 DIAGNOSIS — Z96652 Presence of left artificial knee joint: Secondary | ICD-10-CM | POA: Insufficient documentation

## 2019-01-10 DIAGNOSIS — I10 Essential (primary) hypertension: Secondary | ICD-10-CM | POA: Insufficient documentation

## 2019-01-10 DIAGNOSIS — S0992XA Unspecified injury of nose, initial encounter: Secondary | ICD-10-CM | POA: Diagnosis present

## 2019-01-10 MED ORDER — MORPHINE SULFATE (PF) 4 MG/ML IV SOLN
4.0000 mg | Freq: Once | INTRAVENOUS | Status: DC
  Filled 2019-01-10: qty 1

## 2019-01-10 MED ORDER — OXYMETAZOLINE HCL 0.05 % NA SOLN: 1.0000 | Freq: Two times a day (BID) | NASAL | 0 refills | Status: DC

## 2019-01-10 MED ORDER — OXYCODONE HCL 5 MG PO TABS: 5.0000 mg | ORAL_TABLET | Freq: Four times a day (QID) | ORAL | 0 refills | Status: AC | PRN

## 2019-01-10 MED ORDER — OXYCODONE-ACETAMINOPHEN 5-325 MG PO TABS
2.0000 | ORAL_TABLET | Freq: Once | ORAL | Status: AC
  Administered 2019-01-10: 2 via ORAL
  Filled 2019-01-10: qty 2

## 2019-01-10 MED ORDER — AMLODIPINE BESYLATE 5 MG PO TABS
5.0000 mg | ORAL_TABLET | Freq: Once | ORAL | Status: AC
  Administered 2019-01-10: 5 mg via ORAL
  Filled 2019-01-10: qty 1
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
## 2019-01-10 NOTE — ED Notes (Signed)
In room to medicate patient.  Requests a pill in place of the shot.  Provider made aware.

## 2019-01-10 NOTE — Discharge Instructions (Signed)
Apply ice over a washcloth 20 minutes at a time, 3 times daily. Take your blood pressure medications. No nose blowing. Please be careful taking your pain medication. Pain medication can cause drowsiness, sedation, addiction and deadly overdose. Thank you for allowing me to care for you today. Please return to the emergency department if you have new or worsening symptoms. Take your medications as instructed.
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
## 2019-01-23 ENCOUNTER — Ambulatory Visit: Payer: Medicaid Other | Admitting: Cardiology

## 2019-01-23 ENCOUNTER — Encounter: Payer: Self-pay | Admitting: Cardiology

## 2019-01-23 VITALS — BP 136/88 | HR 71 | Ht 72.0 in | Wt 273.4 lb

## 2019-01-23 DIAGNOSIS — R0609 Other forms of dyspnea: Secondary | ICD-10-CM

## 2019-01-23 DIAGNOSIS — I10 Essential (primary) hypertension: Secondary | ICD-10-CM

## 2019-01-23 DIAGNOSIS — R0602 Shortness of breath: Secondary | ICD-10-CM | POA: Insufficient documentation

## 2019-01-23 DIAGNOSIS — R0600 Dyspnea, unspecified: Secondary | ICD-10-CM

## 2019-01-23 DIAGNOSIS — R079 Chest pain, unspecified: Secondary | ICD-10-CM

## 2019-01-23 DIAGNOSIS — Z0189 Encounter for other specified special examinations: Secondary | ICD-10-CM | POA: Insufficient documentation

## 2019-01-23 HISTORY — DX: Other forms of dyspnea: R06.09

## 2019-01-23 HISTORY — DX: Dyspnea, unspecified: R06.00

## 2019-01-23 HISTORY — DX: Chest pain, unspecified: R07.9

## 2019-01-26 DIAGNOSIS — S022XXA Fracture of nasal bones, initial encounter for closed fracture: Secondary | ICD-10-CM | POA: Insufficient documentation

## 2019-05-26 ENCOUNTER — Encounter (HOSPITAL_COMMUNITY): Payer: Self-pay | Admitting: Emergency Medicine

## 2019-05-26 ENCOUNTER — Emergency Department (HOSPITAL_COMMUNITY): Payer: Medicaid Other

## 2019-05-26 ENCOUNTER — Emergency Department (HOSPITAL_COMMUNITY)
Admission: EM | Admit: 2019-05-26 | Discharge: 2019-05-26 | Disposition: A | Discharge status: Home / Self Care | Payer: Medicaid Other | Attending: Emergency Medicine | Admitting: Emergency Medicine

## 2019-05-26 DIAGNOSIS — I10 Essential (primary) hypertension: Secondary | ICD-10-CM | POA: Diagnosis not present

## 2019-05-26 DIAGNOSIS — Z87891 Personal history of nicotine dependence: Secondary | ICD-10-CM | POA: Diagnosis not present

## 2019-05-26 DIAGNOSIS — Y929 Unspecified place or not applicable: Secondary | ICD-10-CM | POA: Diagnosis not present

## 2019-05-26 DIAGNOSIS — Y999 Unspecified external cause status: Secondary | ICD-10-CM | POA: Diagnosis not present

## 2019-05-26 DIAGNOSIS — S61211A Laceration without foreign body of left index finger without damage to nail, initial encounter: Secondary | ICD-10-CM | POA: Diagnosis not present

## 2019-05-26 DIAGNOSIS — W268XXA Contact with other sharp object(s), not elsewhere classified, initial encounter: Secondary | ICD-10-CM | POA: Insufficient documentation

## 2019-05-26 DIAGNOSIS — E119 Type 2 diabetes mellitus without complications: Secondary | ICD-10-CM | POA: Diagnosis not present

## 2019-05-26 DIAGNOSIS — Z23 Encounter for immunization: Secondary | ICD-10-CM | POA: Insufficient documentation

## 2019-05-26 DIAGNOSIS — Z7984 Long term (current) use of oral hypoglycemic drugs: Secondary | ICD-10-CM | POA: Diagnosis not present

## 2019-05-26 DIAGNOSIS — Z79899 Other long term (current) drug therapy: Secondary | ICD-10-CM | POA: Insufficient documentation

## 2019-05-26 DIAGNOSIS — Y939 Activity, unspecified: Secondary | ICD-10-CM | POA: Insufficient documentation

## 2019-05-26 DIAGNOSIS — Z7982 Long term (current) use of aspirin: Secondary | ICD-10-CM | POA: Insufficient documentation

## 2019-05-26 LAB — CBG MONITORING, ED: Glucose-Capillary: 233 mg/dL — ABNORMAL HIGH (ref 70–99)

## 2019-05-26 MED ORDER — DOXYCYCLINE HYCLATE 100 MG PO CAPS: 100.0000 mg | ORAL_CAPSULE | Freq: Two times a day (BID) | ORAL | 0 refills | Status: DC

## 2019-05-26 MED ORDER — IBUPROFEN 800 MG PO TABS
800.0000 mg | ORAL_TABLET | Freq: Once | ORAL | Status: AC
  Administered 2019-05-26: 800 mg via ORAL
  Filled 2019-05-26: qty 1

## 2019-05-26 MED ORDER — LIDOCAINE HCL 2 % IJ SOLN
20.0000 mL | Freq: Once | INTRAMUSCULAR | Status: DC
  Filled 2019-05-26: qty 20

## 2019-05-26 MED ORDER — TETANUS-DIPHTH-ACELL PERTUSSIS 5-2.5-18.5 LF-MCG/0.5 IM SUSP
0.5000 mL | Freq: Once | INTRAMUSCULAR | Status: AC
  Administered 2019-05-26: 0.5 mL via INTRAMUSCULAR
  Filled 2019-05-26: qty 0.5

## 2019-05-26 MED ORDER — METFORMIN HCL 500 MG PO TABS: 500.0000 mg | ORAL_TABLET | Freq: Two times a day (BID) | ORAL | 0 refills | Status: DC

## 2019-05-26 NOTE — ED Provider Notes (Addendum)
[facility] Provider Note   CSN: [id number] Arrival date & time: 05/26/19  1039    History   Chief Complaint Chief Complaint  Patient presents with  . Hand Injury    HPI Jeffery Houston is a 48 y.o. male.     HPI   48 year old male presents today with finger injury.  Patient notes he was working on a car when he cut the dorsal aspect of his left second digit.  He denies any loss of sensation strength range of motion presently.  He is uncertain when his last tetanus was.  He notes he is diabetic but his primary care providers not have him on insulin for unknown reason.    Past Medical History:  Diagnosis Date  . Arthritis    bil knees  . Diabetes mellitus without complication (HCC)   . Exertional chest pain 01/23/2019  . Exertional dyspnea 01/23/2019  . Hypertension     Patient Active Problem List   Diagnosis Date Noted  . Exertional dyspnea 01/23/2019  . Laboratory examination 01/23/2019  . Exertional chest pain 01/23/2019  . Primary osteoarthritis of right knee 09/24/2016  . Essential hypertension 09/24/2016  . 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 FOOT SURGERY    . PARTIAL KNEE ARTHROPLASTY Left 07/22/2015   Procedure: LEF PARTIAL KNEE REPLACEMENT ;  Surgeon: Sheral Apleyimothy D Murphy, MD;  Location: [facility];  Service: Orthopedics;  Laterality: Left;  . PARTIAL KNEE ARTHROPLASTY Right 10/05/2016   Procedure: UNICOMPARTMENTAL KNEE;  Surgeon: Sheral Apleyimothy D Murphy, MD;  Location: [facility];  Service: Orthopedics;  Laterality: Right;  Pre/Post Op femoral nerve block  . WRIST SURGERY Right    tendon repair        Home Medications    Prior to Admission medications   Medication Sig Start Date End Date Taking? Authorizing Provider  acetaminophen (TYLENOL) 500 MG tablet Take 1,000 mg by mouth daily as needed (shoulder pain).    [provider]  albuterol (PROAIR HFA) 108 (90 Base) MCG/ACT inhaler Inhale 2 puffs into the lungs every 4 (four) hours as needed for wheezing or shortness of breath.    [provider]  amLODipine (NORVASC) 10 MG tablet Take 10 mg by mouth daily. 01/11/15   [provider]  aspirin EC 325 MG tablet Take 1 tablet (325 mg total) by mouth daily. Patient not taking: Reported on 01/23/2019 10/05/16   Albina BilletMartensen, Henry Calvin III, PA-C  aspirin EC 81 MG tablet Take 81 mg by mouth daily.    [provider]  docusate sodium (COLACE) 100 MG capsule Take 1 capsule (100 mg total) by mouth 2 (two) times daily. Patient not taking: Reported on 01/10/2019 10/05/16   Albina BilletMartensen, Henry Calvin III, PA-C  doxycycline (VIBRAMYCIN) 100 MG capsule Take 1 capsule (100 mg total) by mouth 2 (two) times daily. 05/26/19   Dayle Sherpa, Tinnie GensJeffrey, PA-C  esomeprazole (NEXIUM) 40 MG capsule Take 40 mg by mouth daily as needed (acid reflux).  09/09/18   [provider]  ezetimibe (ZETIA) 10 MG tablet Take 10 mg by mouth daily. 09/09/18   [provider]  gabapentin (NEURONTIN) 300 MG capsule Take 300 mg by mouth 2 (two) times daily.  09/09/18   [provider]  ibuprofen (ADVIL,MOTRIN) 600 MG tablet Take 1 tablet (600 mg total) by mouth every 6 (six) hours as needed for moderate pain. 04/10/16  Charm RingsHonig, Erin J, MD  Insulin Detemir (LEVEMIR FLEXTOUCH) 100 UNIT/ML Pen Inject 8-10 Units into the skin See admin instructions. Inject 10 units subcutaneously every morning and 8 units at night    [provider]  lisinopril (PRINIVIL,ZESTRIL) 20 MG tablet Take 20 mg by mouth daily. 09/09/18   [provider]  meloxicam (MOBIC) 15 MG tablet Take 15 mg by mouth daily as needed (shoulder pain).  08/07/18   [provider]  Menthol, Topical Analgesic, (ICY HOT EX) Apply 1 application topically at bedtime.    [provider]  metFORMIN (GLUCOPHAGE) 500 MG tablet Take 1  tablet (500 mg total) by mouth 2 (two) times daily with a meal. 05/26/19   Brice Kossman, Tinnie GensJeffrey, PA-C  methocarbamol (ROBAXIN) 500 MG tablet Take 1 tablet (500 mg total) by mouth every 6 (six) hours as needed for muscle spasms. Patient not taking: Reported on 01/10/2019 10/05/16   Albina BilletMartensen, Henry Calvin III, PA-C  omeprazole (PRILOSEC) 20 MG capsule Take 1 capsule (20 mg total) by mouth daily. While taking anti inflammatory medicine daily 10/05/16   Albina BilletMartensen, Henry Calvin III, PA-C  ondansetron (ZOFRAN) 4 MG tablet Take 1 tablet (4 mg total) by mouth every 8 (eight) hours as needed for nausea or vomiting. Patient not taking: Reported on 11/02/2016 10/05/16   Albina BilletMartensen, Henry Calvin III, PA-C  oxyCODONE-acetaminophen (PERCOCET) 5-325 MG tablet Take 1-2 tablets by mouth every 4 (four) hours as needed. Patient not taking: Reported on 01/10/2019 02/16/17   Gilda CreasePollina, Christopher J, MD  oxymetazoline (AFRIN NASAL SPRAY) 0.05 % nasal spray Place 1 spray into both nostrils 2 (two) times daily. 01/10/19   Arlyn DunningMcLean, Kelly A, PA-C    Family History Family History  Problem Relation Age of Onset  . Heart attack Brother     Social History Social History   Tobacco Use  . Smoking status: Former Smoker    Packs/day: 2.00    Types: Cigarettes    Quit date: 05/14/2010    Years since quitting: 9.0  . Smokeless tobacco: Former Engineer, waterUser  Substance Use Topics  . Alcohol use: Yes    Comment: social  . Drug use: No     Allergies   Latex   Review of Systems Review of Systems  All other systems reviewed and are negative.    Physical Exam Updated Vital Signs BP (!) 169/93 (BP Location: Left Arm)   Pulse 86   Temp 97.8 F (36.6 C) (Oral)   Resp 20   SpO2 97%   Physical Exam Vitals signs and nursing note reviewed.  Constitutional:      Appearance: He is well-developed.  HENT:     Head: Normocephalic and atraumatic.  Eyes:     General: No scleral icterus.       Right eye: No discharge.        Left eye:  No discharge.     Conjunctiva/sclera: Conjunctivae normal.     Pupils: Pupils are equal, round, and reactive to light.  Neck:     Musculoskeletal: Normal range of motion.     Vascular: No JVD.     Trachea: No tracheal deviation.  Pulmonary:     Effort: Pulmonary effort is normal.     Breath sounds: No stridor.  Musculoskeletal:     Comments: Left index finger with a 1.5 cm laceration over the dorsal DIP-no deep space involvement, no tendon or bony abnormality, no foreign bodies full active flexion extension of the DIP PIP and MCP  Neurological:  Mental Status: He is alert and oriented to person, place, and time.     Coordination: Coordination normal.  Psychiatric:        Behavior: Behavior normal.        Thought Content: Thought content normal.        Judgment: Judgment normal.     ED Treatments / Results  Labs (all labs ordered are listed, but only abnormal results are displayed) Labs Reviewed  CBG MONITORING, ED - Abnormal; Notable for the following components:      Result Value   Glucose-Capillary 233 (*)    All other components within normal limits    EKG None  Radiology Dg Finger Index Left  Result Date: 05/26/2019 CLINICAL DATA:  Pain following trauma/laceration EXAM: LEFT SECOND FINGER 2+V COMPARISON:  None. FINDINGS: Frontal, oblique, and lateral views obtained. No fracture or dislocation. There is soft tissue swelling dorsally in the second PIP joint region. No appreciable joint space narrowing or erosion. No radiopaque foreign body or soft tissue air. IMPRESSION: Soft tissue swelling along the dorsal PIP joint region. No fracture or dislocation. No radiopaque foreign body. No appreciable arthropathic change. Electronically Signed   By: Lowella Grip III M.D.   On: 05/26/2019 11:47    Procedures .Nerve Block  Date/Time: 05/26/2019 4:07 PM Performed by: Okey Regal, PA-C Authorized by: Okey Regal, PA-C   Consent:    Consent obtained:  Verbal    Consent given by:  Patient   Risks discussed:  Allergic reaction, nerve damage, infection and swelling Indications:    Indications:  Pain relief Location:    Body area:  Upper extremity   Laterality:  Left (Left second finger) Pre-procedure details:    Skin preparation:  2% chlorhexidine Procedure details (see MAR for exact dosages):    Block needle gauge:  25 G   Anesthetic injected:  Lidocaine 2% w/o epi   Steroid injected:  None   Injection procedure:  Incremental injection, anatomic landmarks identified, anatomic landmarks palpated, introduced needle and negative aspiration for blood   Paresthesia:  None Post-procedure details:    Outcome:  Anesthesia achieved   Patient tolerance of procedure:  Tolerated well, no immediate complications   (including critical care time)  Medications Ordered in ED Medications  lidocaine (XYLOCAINE) 2 % (with pres) injection 400 mg (has no administration in time range)  Tdap (BOOSTRIX) injection 0.5 mL (0.5 mLs Intramuscular Given 05/26/19 1148)  ibuprofen (ADVIL) tablet 800 mg (800 mg Oral Given 05/26/19 1310)     Initial Impression / Assessment and Plan / ED Course  I have reviewed the triage vital signs and the nursing notes.  Pertinent labs & imaging results that were available during my care of the patient were reviewed by me and considered in my medical decision making (see chart for details).       Labs:   Imaging:  Consults:  Therapeutics:  Discharge Meds:   Assessment/Plan: 48 year old male presents today with laceration over his DIP.  No deep space involvement, no bony abnormality.  Patient did have the wound greater than 18 hours ago I do not feel comfortable closing it.  His blood sugar slightly elevated he will be started on metformin.  Placed on antibiotics, encouraged to return immediately if develops any new or worsening signs or symptoms.  Outpatient follow-up with his primary care for ongoing evaluation management of his  diabetes.  He verbalized understanding and agreement to today's plan.   Final Clinical Impressions(s) / ED Diagnoses  Final diagnoses:  Laceration of left index finger without foreign body without damage to nail, initial encounter    ED Discharge Orders         Ordered    doxycycline (VIBRAMYCIN) 100 MG capsule  2 times daily     05/26/19 1257    metFORMIN (GLUCOPHAGE) 500 MG tablet  2 times daily with meals     05/26/19 1409           Eyvonne MechanicHedges, Bing Duffey, PA-C 05/26/19 1607    Brittney Caraway, Tinnie GensJeffrey, PA-C 05/26/19 1609    Gwyneth SproutPlunkett, Whitney, MD 05/29/19 2117

## 2019-05-26 NOTE — Discharge Instructions (Addendum)
Please read attached information. If you experience any new or worsening signs or symptoms please return to the emergency room for evaluation. Please follow-up with your primary care provider or specialist as discussed. Please use medication prescribed only as directed and discontinue taking if you have any concerning signs or symptoms.   °

## 2019-05-26 NOTE — ED Triage Notes (Signed)
Pt has a laceration on his left pointer that he injured working on his car last night at 8pm.

## 2019-06-15 ENCOUNTER — Other Ambulatory Visit: Payer: Self-pay | Admitting: Sports Medicine

## 2019-06-15 DIAGNOSIS — M5412 Radiculopathy, cervical region: Secondary | ICD-10-CM

## 2019-06-15 DIAGNOSIS — M542 Cervicalgia: Secondary | ICD-10-CM

## 2019-07-09 ENCOUNTER — Other Ambulatory Visit: Payer: Medicaid Other

## 2019-07-17 ENCOUNTER — Other Ambulatory Visit: Payer: Medicaid Other

## 2019-09-10 ENCOUNTER — Emergency Department (HOSPITAL_COMMUNITY): Payer: Medicaid Other

## 2019-09-10 ENCOUNTER — Other Ambulatory Visit: Payer: Self-pay

## 2019-09-10 ENCOUNTER — Emergency Department (HOSPITAL_COMMUNITY)
Admission: EM | Admit: 2019-09-10 | Discharge: 2019-09-10 | Disposition: A | Discharge status: Home / Self Care | Payer: Medicaid Other | Attending: Emergency Medicine | Admitting: Emergency Medicine

## 2019-09-10 DIAGNOSIS — Y929 Unspecified place or not applicable: Secondary | ICD-10-CM | POA: Diagnosis not present

## 2019-09-10 DIAGNOSIS — E119 Type 2 diabetes mellitus without complications: Secondary | ICD-10-CM | POA: Diagnosis not present

## 2019-09-10 DIAGNOSIS — W270XXA Contact with workbench tool, initial encounter: Secondary | ICD-10-CM | POA: Insufficient documentation

## 2019-09-10 DIAGNOSIS — Y999 Unspecified external cause status: Secondary | ICD-10-CM | POA: Diagnosis not present

## 2019-09-10 DIAGNOSIS — I10 Essential (primary) hypertension: Secondary | ICD-10-CM | POA: Diagnosis not present

## 2019-09-10 DIAGNOSIS — S62639A Displaced fracture of distal phalanx of unspecified finger, initial encounter for closed fracture: Secondary | ICD-10-CM

## 2019-09-10 DIAGNOSIS — Y9389 Activity, other specified: Secondary | ICD-10-CM | POA: Diagnosis not present

## 2019-09-10 DIAGNOSIS — S62665A Nondisplaced fracture of distal phalanx of left ring finger, initial encounter for closed fracture: Secondary | ICD-10-CM | POA: Insufficient documentation

## 2019-09-10 DIAGNOSIS — S6982XA Other specified injuries of left wrist, hand and finger(s), initial encounter: Secondary | ICD-10-CM | POA: Diagnosis present

## 2019-09-10 NOTE — Progress Notes (Signed)
Orthopedic Tech Progress Note Patient Details:  Jeffery Houston June 29, 1971 [id number]  Ortho Devices Type of Ortho Device: Finger splint Ortho Device/Splint Location: LUE Ortho Device/Splint Interventions: Ordered, Application   Post Interventions Patient Tolerated: Well Instructions Provided: Care of device   Braulio Bosch 09/10/2019, 2:22 PM

## 2019-09-10 NOTE — ED Notes (Signed)
Patient verbalizes understanding of discharge instructions. Opportunity for questioning and answers were provided. Armband removed by staff, pt discharged from ED. Ambulated out to lobby  

## 2019-09-10 NOTE — Discharge Instructions (Addendum)
Please read instructions below. Apply ice to your finger for 20 minutes at a time. Elevate it as much as possible to help with swelling. You can take your prescribed medications as discussed for pain. Schedule an appointment with the hand specialist in 1-2 weeks for repeat x-ray and follow-up on your injury. Return to the ER for new or concerning symptoms.

## 2019-09-10 NOTE — ED Provider Notes (Signed)
[facility] EMERGENCY DEPARTMENT Provider Note   CSN: [id number] Arrival date & time: 09/10/19  1214     History   Chief Complaint Chief Complaint  Patient presents with  . Finger Injury    HPI Jeffery Houston is a 48 y.o. male w PMHx HTN, DM, presenting to the ED with complaint of sudden onset of left ring finger injury that occurred yesterday.  Patient states he accidentally hit his finger with a hammer as he was building his deck and has had pain since then.  Pain is described as throbbing, worse with movement. He takes meloxicam and a spasm medication which has provided adequate improvement of patient's pain. Right hand dominant. No wounds.      The history is provided by the patient.    Past Medical History:  Diagnosis Date  . Arthritis    bil knees  . Diabetes mellitus without complication (HCC)   . Exertional chest pain 01/23/2019  . Exertional dyspnea 01/23/2019  . Hypertension     Patient Active Problem List   Diagnosis Date Noted  . Exertional dyspnea 01/23/2019  . Laboratory examination 01/23/2019  . Exertional chest pain 01/23/2019  . Primary osteoarthritis of right knee 09/24/2016  . Essential hypertension 09/24/2016  . 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 FOOT SURGERY    . PARTIAL KNEE ARTHROPLASTY Left 07/22/2015   Procedure: LEF PARTIAL KNEE REPLACEMENT ;  Surgeon: Sheral Apley, MD;  Location: [facility];  Service: Orthopedics;  Laterality: Left;  . PARTIAL KNEE ARTHROPLASTY Right 10/05/2016   Procedure: UNICOMPARTMENTAL KNEE;  Surgeon: Sheral Apley, MD;  Location: [facility];  Service: Orthopedics;  Laterality: Right;  Pre/Post Op femoral nerve block  . WRIST SURGERY Right    tendon repair        Home Medications    Prior to Admission medications   Medication Sig Start Date End Date Taking? Authorizing Provider   acetaminophen (TYLENOL) 500 MG tablet Take 1,000 mg by mouth daily as needed (shoulder pain).    [provider]  albuterol (PROAIR HFA) 108 (90 Base) MCG/ACT inhaler Inhale 2 puffs into the lungs every 4 (four) hours as needed for wheezing or shortness of breath.    [provider]  amLODipine (NORVASC) 10 MG tablet Take 10 mg by mouth daily. 01/11/15   [provider]  aspirin EC 325 MG tablet Take 1 tablet (325 mg total) by mouth daily. Patient not taking: Reported on 01/23/2019 10/05/16   Albina Billet III, PA-C  aspirin EC 81 MG tablet Take 81 mg by mouth daily.    [provider]  docusate sodium (COLACE) 100 MG capsule Take 1 capsule (100 mg total) by mouth 2 (two) times daily. Patient not taking: Reported on 01/10/2019 10/05/16   Albina Billet III, PA-C  doxycycline (VIBRAMYCIN) 100 MG capsule Take 1 capsule (100 mg total) by mouth 2 (two) times daily. 05/26/19   Hedges, Tinnie Gens, PA-C  esomeprazole (NEXIUM) 40 MG capsule Take 40 mg by mouth daily as needed (acid reflux).  09/09/18   [provider]  ezetimibe (ZETIA) 10 MG tablet Take 10 mg by mouth daily. 09/09/18   [provider]  gabapentin (NEURONTIN) 300 MG capsule Take 300 mg by mouth 2 (two) times daily.  09/09/18   [provider]  ibuprofen (ADVIL,MOTRIN) 600 MG tablet Take 1 tablet (600 mg total) by  mouth every 6 (six) hours as needed for moderate pain. 04/10/16   Melony Overly, MD  Insulin Detemir (LEVEMIR FLEXTOUCH) 100 UNIT/ML Pen Inject 8-10 Units into the skin See admin instructions. Inject 10 units subcutaneously every morning and 8 units at night    [provider]  lisinopril (PRINIVIL,ZESTRIL) 20 MG tablet Take 20 mg by mouth daily. 09/09/18   [provider]  meloxicam (MOBIC) 15 MG tablet Take 15 mg by mouth daily as needed (shoulder pain).  08/07/18   [provider]  Menthol, Topical Analgesic, (ICY HOT EX) Apply 1  application topically at bedtime.    [provider]  metFORMIN (GLUCOPHAGE) 500 MG tablet Take 1 tablet (500 mg total) by mouth 2 (two) times daily with a meal. 05/26/19   Hedges, Dellis Filbert, PA-C  methocarbamol (ROBAXIN) 500 MG tablet Take 1 tablet (500 mg total) by mouth every 6 (six) hours as needed for muscle spasms. Patient not taking: Reported on 01/10/2019 10/05/16   Prudencio Burly III, PA-C  omeprazole (PRILOSEC) 20 MG capsule Take 1 capsule (20 mg total) by mouth daily. While taking anti inflammatory medicine daily 10/05/16   Prudencio Burly III, PA-C  ondansetron (ZOFRAN) 4 MG tablet Take 1 tablet (4 mg total) by mouth every 8 (eight) hours as needed for nausea or vomiting. Patient not taking: Reported on 11/02/2016 10/05/16   Prudencio Burly III, PA-C  oxyCODONE-acetaminophen (PERCOCET) 5-325 MG tablet Take 1-2 tablets by mouth every 4 (four) hours as needed. Patient not taking: Reported on 01/10/2019 02/16/17   Orpah Greek, MD  oxymetazoline (AFRIN NASAL SPRAY) 0.05 % nasal spray Place 1 spray into both nostrils 2 (two) times daily. 01/10/19   Alveria Apley, PA-C    Family History Family History  Problem Relation Age of Onset  . Heart attack Brother     Social History Social History   Tobacco Use  . Smoking status: Former Smoker    Packs/day: 2.00    Types: Cigarettes    Quit date: 05/14/2010    Years since quitting: 9.3  . Smokeless tobacco: Former Network engineer Use Topics  . Alcohol use: Yes    Comment: social  . Drug use: No     Allergies   Latex   Review of Systems Review of Systems  Musculoskeletal: Positive for arthralgias.  Skin: Negative for wound.     Physical Exam Updated Vital Signs BP (!) 149/94 (BP Location: Right Arm)   Pulse 78   Temp 98.3 F (36.8 C) (Oral)   Resp 16   SpO2 96%   Physical Exam Vitals signs and nursing note reviewed.  Constitutional:      General: He is not in acute distress.     Appearance: He is well-developed.  HENT:     Head: Normocephalic and atraumatic.  Eyes:     Conjunctiva/sclera: Conjunctivae normal.  Cardiovascular:     Rate and Rhythm: Normal rate.  Pulmonary:     Effort: Pulmonary effort is normal.  Musculoskeletal:     Comments: Distal left phalanx of the 4th digit with ecchymosis and mild swelling. No deformity. TTP is present. There is a very small subungual hematoma along the ulnar aspect, it is shaped linearly and only about 1x84mm in size. Nail is otherwise intact. Intact radial pulse.   Neurological:     Mental Status: He is alert.  Psychiatric:        Mood and Affect: Mood normal.  Behavior: Behavior normal.      ED Treatments / Results  Labs (all labs ordered are listed, but only abnormal results are displayed) Labs Reviewed - No data to display  EKG None  Radiology Dg Finger Ring Left  Result Date: 09/10/2019 CLINICAL DATA:  Left fourth finger pain after hammer injury. EXAM: LEFT RING FINGER 2+V COMPARISON:  None. FINDINGS: Minimally displaced fracture is seen involving the distal tuft of the fourth distal phalanx. No bony abnormality is noted. Joint spaces are intact. No soft tissue abnormality is noted. IMPRESSION: Minimally displaced distal tuft fracture of fourth distal phalanx. Electronically Signed   By: Lupita RaiderJames  Green Jr M.D.   On: 09/10/2019 13:27    Procedures Procedures (including critical care time)  Medications Ordered in ED Medications - No data to display   Initial Impression / Assessment and Plan / ED Course  I have reviewed the triage vital signs and the nursing notes.  Pertinent labs & imaging results that were available during my care of the patient were reviewed by me and considered in my medical decision making (see chart for details).        Pt with a closed tuft fracture of the left 4th digit after accidentally hitting his finger with a hammer yesterday. There is a very small subungual  hematoma along the ulnar edge of the nail, nail is intact. No wounds. Pt reports adequate pain control with his home medications and does not feel he needs any additional rx. Finger splinted and outpt hand referral provided for follow up. Pt agreeable to plan and safe for discharge.   Discussed results, findings, treatment and follow up. Patient advised of return precautions. Patient verbalized understanding and agreed with plan.  Final Clinical Impressions(s) / ED Diagnoses   Final diagnoses:  Closed fracture of tuft of distal phalanx of finger    ED Discharge Orders    None       Robinson, [place]azil[place]Jordan N, PA-C 09/10/19 1416    Milagros Lollykstra, Richard S, MD 09/10/19 [phone]

## 2019-09-10 NOTE — ED Triage Notes (Signed)
Pt here with L ring finger injury yesterday, hit finger with a hammer. No OTC meds PTA.

## 2020-01-26 NOTE — Progress Notes (Signed)
No show

## 2020-01-27 ENCOUNTER — Ambulatory Visit: Payer: Medicaid Other | Admitting: Cardiology

## 2020-08-25 ENCOUNTER — Other Ambulatory Visit: Payer: Self-pay

## 2020-08-25 ENCOUNTER — Emergency Department (HOSPITAL_COMMUNITY): Payer: Medicaid Other

## 2020-08-25 ENCOUNTER — Emergency Department (HOSPITAL_COMMUNITY)
Admission: EM | Admit: 2020-08-25 | Discharge: 2020-08-26 | Disposition: A | Discharge status: Home / Self Care | Payer: Medicaid Other | Attending: Emergency Medicine | Admitting: Emergency Medicine

## 2020-08-25 ENCOUNTER — Encounter (HOSPITAL_COMMUNITY): Payer: Self-pay

## 2020-08-25 DIAGNOSIS — I10 Essential (primary) hypertension: Secondary | ICD-10-CM | POA: Insufficient documentation

## 2020-08-25 DIAGNOSIS — E119 Type 2 diabetes mellitus without complications: Secondary | ICD-10-CM | POA: Diagnosis not present

## 2020-08-25 DIAGNOSIS — R059 Cough, unspecified: Secondary | ICD-10-CM | POA: Diagnosis present

## 2020-08-25 DIAGNOSIS — Z96651 Presence of right artificial knee joint: Secondary | ICD-10-CM | POA: Insufficient documentation

## 2020-08-25 DIAGNOSIS — U071 COVID-19: Secondary | ICD-10-CM | POA: Insufficient documentation

## 2020-08-25 DIAGNOSIS — Z87891 Personal history of nicotine dependence: Secondary | ICD-10-CM | POA: Insufficient documentation

## 2020-08-25 DIAGNOSIS — Z20822 Contact with and (suspected) exposure to covid-19: Secondary | ICD-10-CM

## 2020-08-25 MED ORDER — ACETAMINOPHEN 325 MG PO TABS
650.0000 mg | ORAL_TABLET | Freq: Once | ORAL | Status: AC | PRN
  Administered 2020-08-25: 650 mg via ORAL
  Filled 2020-08-25: qty 2

## 2020-08-25 NOTE — ED Notes (Signed)
Below order not completed by EW. 

## 2020-08-25 NOTE — ED Provider Notes (Signed)
[facility]-EMERGENCY DEPT [facility] Emergency Department Provider Note MRN:  [id number]  Arrival date & time: 08/25/20     Chief Complaint   Cough   History of Present Illness   Jeffery Houston is a 49 y.o. year-old male with a history of diabetes presenting to the ED with chief complaint of cough.  3 days of cough, malaise.  Daughter has tested positive for Covid.  Patient does not think he has Covid, thinks he has the flu.  Denies chest pain or shortness of breath, no rash, no leg pain or swelling, no other complaints.  Symptoms are mild, constant, no exacerbating or relieving factors.  Review of Systems  A complete 10 system review of systems was obtained and all systems are negative except as noted in the HPI and PMH.   Patient's Health History    Past Medical History:  Diagnosis Date  . Arthritis    bil knees  . Diabetes mellitus without complication (HCC)   . Exertional chest pain 01/23/2019  . Exertional dyspnea 01/23/2019  . Hypertension     Past Surgical History:  Procedure Laterality Date  . FOOT SURGERY    . PARTIAL KNEE ARTHROPLASTY Left 07/22/2015   Procedure: LEF PARTIAL KNEE REPLACEMENT ;  Surgeon: Sheral Apley, MD;  Location: [facility];  Service: Orthopedics;  Laterality: Left;  . PARTIAL KNEE ARTHROPLASTY Right 10/05/2016   Procedure: UNICOMPARTMENTAL KNEE;  Surgeon: Sheral Apley, MD;  Location: [facility];  Service: Orthopedics;  Laterality: Right;  Pre/Post Op femoral nerve block  . WRIST SURGERY Right    tendon repair    Family History  Problem Relation Age of Onset  . Heart attack Brother     Social History   Socioeconomic History  . Marital status: Married    Spouse name: Not on file  . Number of children: 0  . Years of education: Not on file  . Highest education level: Not on file  Occupational History  . Not on file  Tobacco Use  . Smoking status: Former Smoker    Packs/day: 2.00    Types: Cigarettes     Quit date: 05/14/2010    Years since quitting: 10.2  . Smokeless tobacco: Former Clinical biochemist  . Vaping Use: Never used  Substance and Sexual Activity  . Alcohol use: Yes    Comment: social  . Drug use: No  . Sexual activity: Yes  Other Topics Concern  . Not on file  Social History Narrative  . Not on file   Social Determinants of Health   Financial Resource Strain:   . Difficulty of Paying Living Expenses: Not on file  Food Insecurity:   . Worried About Programme researcher, broadcasting/film/video in the Last Year: Not on file  . Ran Out of Food in the Last Year: Not on file  Transportation Needs:   . Lack of Transportation (Medical): Not on file  . Lack of Transportation (Non-Medical): Not on file  Physical Activity:   . Days of Exercise per Week: Not on file  . Minutes of Exercise per Session: Not on file  Stress:   . Feeling of Stress : Not on file  Social Connections:   . Frequency of Communication with Friends and Family: Not on file  . Frequency of Social Gatherings with Friends and Family: Not on file  . Attends Religious Services: Not on file  . Active Member of Clubs or Organizations: Not on file  .  Attends Banker Meetings: Not on file  . Marital Status: Not on file  Intimate Partner Violence:   . Fear of Current or Ex-Partner: Not on file  . Emotionally Abused: Not on file  . Physically Abused: Not on file  . Sexually Abused: Not on file     Physical Exam   Vitals:   08/25/20 2201 08/25/20 2307  BP: 128/84 126/80  Pulse: (!) 102 87  Resp: (!) 24 (!) 30  Temp: (!) 101.3 F (38.5 C) 98.8 F (37.1 C)  SpO2: 92% 93%    CONSTITUTIONAL: Well-appearing, NAD NEURO:  Alert and oriented x 3, no focal deficits EYES:  eyes equal and reactive ENT/NECK:  no LAD, no JVD CARDIO: Regular rate, well-perfused, normal S1 and S2 PULM:  CTAB no wheezing or rhonchi GI/GU:  normal bowel sounds, non-distended, non-tender MSK/SPINE:  No gross deformities, no edema SKIN:  no  rash, atraumatic PSYCH:  Appropriate speech and behavior  *Additional and/or pertinent findings included in MDM below  Diagnostic and Interventional Summary    EKG Interpretation  Date/Time:    Ventricular Rate:    PR Interval:    QRS Duration:   QT Interval:    QTC Calculation:   R Axis:     Text Interpretation:        Labs Reviewed  RESPIRATORY PANEL BY RT PCR (FLU A&B, COVID)    DG Chest Port 1 View  Final Result      Medications  acetaminophen (TYLENOL) tablet 650 mg (650 mg Oral Given 08/25/20 2204)     Procedures  /  Critical Care Procedures  ED Course and Medical Decision Making  I have reviewed the triage vital signs, the nursing notes, and pertinent available records from the EMR.  Listed above are laboratory and imaging tests that I personally ordered, reviewed, and interpreted and then considered in my medical decision making (see below for details).       Suspect COVID-19, no hypoxia, no increased work of breathing, normal vital signs, appropriate for discharge.  Elmer Sow. Pilar Plate, MD [facility] Emergency Medicine [facility] [email or web address]  Final Clinical Impressions(s) / ED Diagnoses     ICD-10-CM   1. Suspected COVID-19 virus infection  Z20.822   2. Cough  R05.9 DG Chest Port 1 View    DG Chest Port 1 View    ED Discharge Orders    None       Discharge Instructions Discussed with and Provided to Patient:     Discharge Instructions     You were evaluated in the Emergency Department and after careful evaluation, we did not find any emergent condition requiring admission or further testing in the hospital.  Your exam/testing today is overall reassuring.  Please follow up with your covid test result.  If positive please practice home isolation for the next 2 weeks.  You have been referred to the outpatient antibody therapy program and they will call you for therapy.  Please return to the Emergency Department if you  experience any worsening of your condition.   Thank you for allowing [place] to be a part of your care.      Sabas Sous, MD 08/25/20 [phone]

## 2020-08-25 NOTE — ED Triage Notes (Signed)
Patient arrived with complaints of cough and weakness over the last three days. Reports taking Tylenol last night. States his daughter at home is Covid-19 positive.

## 2020-08-25 NOTE — Discharge Instructions (Addendum)
You were evaluated in the Emergency Department and after careful evaluation, we did not find any emergent condition requiring admission or further testing in the hospital.  Your exam/testing today is overall reassuring.  Please follow up with your covid test result.  If positive please practice home isolation for the next 2 weeks.  You have been referred to the outpatient antibody therapy program and they will call you for therapy.  Please return to the Emergency Department if you experience any worsening of your condition.   Thank you for allowing [place] to be a part of your care.

## 2020-08-26 LAB — RESPIRATORY PANEL BY RT PCR (FLU A&B, COVID)
Influenza A by PCR: NEGATIVE
Influenza B by PCR: NEGATIVE
SARS Coronavirus 2 by RT PCR: POSITIVE — AB

## 2020-08-27 ENCOUNTER — Telehealth (HOSPITAL_COMMUNITY): Payer: Self-pay | Admitting: Nurse Practitioner

## 2020-08-27 ENCOUNTER — Encounter: Payer: Self-pay | Admitting: Nurse Practitioner

## 2020-08-27 DIAGNOSIS — U071 COVID-19: Secondary | ICD-10-CM

## 2020-08-27 NOTE — Telephone Encounter (Signed)
Called to Discuss with patient about Covid symptoms and the use of regeneron, a monoclonal antibody infusion for those with mild to moderate Covid symptoms and at a high risk of hospitalization.     Pt is qualified for this infusion at the [facility] due to co-morbid conditions and/or a member of an at-risk group.     Unable to reach pt. Left message to return call. Sent mychart message. Sent text.   Jerrit Horen, DNP, AGNP-C [phone] (Infusion Center Hotline)  

## 2020-08-28 ENCOUNTER — Encounter: Payer: Self-pay | Admitting: Nurse Practitioner

## 2020-08-29 ENCOUNTER — Telehealth: Payer: Self-pay

## 2020-08-29 NOTE — Telephone Encounter (Signed)
Transition Care Management Follow-up Telephone Call  Date of discharge and from where: 08/25/2020 from [facility]  How have you been since you were released from the hospital? Patient is feeling okay. He was not able to get the infusion yet but was given the number for the Infusion Clinic.   Any questions or concerns? No  Items Reviewed:  Did the pt receive and understand the discharge instructions provided? Yes   Medications obtained and verified? Yes   Any new allergies since your discharge? Yes   Dietary orders reviewed? Yes  Do you have support at home? Yes   Functional Questionnaire: (I = Independent and D = Dependent) ADLs: I Bathing/Dressing- I Meal Prep- I Eating- I Maintaining continence- I Transferring/Ambulation- I Managing Meds- I  Follow up appointments reviewed:   Sending a message to outreach to get patient.   Are transportation arrangements needed? No   If their condition worsens, is the pt aware to call PCP or go to the Emergency Dept.? Yes  Was the patient provided with contact information for the PCP's office or ED? Yes  Was to pt encouraged to call back with questions or concerns? Yes

## 2020-08-30 ENCOUNTER — Emergency Department (HOSPITAL_COMMUNITY): Payer: Medicaid Other

## 2020-08-30 ENCOUNTER — Other Ambulatory Visit: Payer: Self-pay

## 2020-08-30 ENCOUNTER — Encounter (HOSPITAL_COMMUNITY): Payer: Self-pay | Admitting: Emergency Medicine

## 2020-08-30 ENCOUNTER — Emergency Department (HOSPITAL_COMMUNITY)
Admission: EM | Admit: 2020-08-30 | Discharge: 2020-08-30 | Disposition: A | Discharge status: Home / Self Care | Payer: Medicaid Other | Attending: Emergency Medicine | Admitting: Emergency Medicine

## 2020-08-30 DIAGNOSIS — Z79899 Other long term (current) drug therapy: Secondary | ICD-10-CM | POA: Insufficient documentation

## 2020-08-30 DIAGNOSIS — Z794 Long term (current) use of insulin: Secondary | ICD-10-CM | POA: Insufficient documentation

## 2020-08-30 DIAGNOSIS — J1282 Pneumonia due to coronavirus disease 2019: Secondary | ICD-10-CM | POA: Insufficient documentation

## 2020-08-30 DIAGNOSIS — I10 Essential (primary) hypertension: Secondary | ICD-10-CM | POA: Diagnosis not present

## 2020-08-30 DIAGNOSIS — E119 Type 2 diabetes mellitus without complications: Secondary | ICD-10-CM | POA: Diagnosis not present

## 2020-08-30 DIAGNOSIS — R0600 Dyspnea, unspecified: Secondary | ICD-10-CM | POA: Diagnosis present

## 2020-08-30 DIAGNOSIS — Z91040 Latex allergy status: Secondary | ICD-10-CM | POA: Insufficient documentation

## 2020-08-30 DIAGNOSIS — Z7982 Long term (current) use of aspirin: Secondary | ICD-10-CM | POA: Diagnosis not present

## 2020-08-30 DIAGNOSIS — Z87891 Personal history of nicotine dependence: Secondary | ICD-10-CM | POA: Insufficient documentation

## 2020-08-30 DIAGNOSIS — R0602 Shortness of breath: Secondary | ICD-10-CM

## 2020-08-30 DIAGNOSIS — Z96652 Presence of left artificial knee joint: Secondary | ICD-10-CM | POA: Insufficient documentation

## 2020-08-30 DIAGNOSIS — Z7984 Long term (current) use of oral hypoglycemic drugs: Secondary | ICD-10-CM | POA: Insufficient documentation

## 2020-08-30 DIAGNOSIS — U071 COVID-19: Secondary | ICD-10-CM | POA: Diagnosis not present

## 2020-08-30 LAB — CBC WITH DIFFERENTIAL/PLATELET
Abs Immature Granulocytes: 0.07 10*3/uL (ref 0.00–0.07)
Basophils Absolute: 0 10*3/uL (ref 0.0–0.1)
Basophils Relative: 0 %
Eosinophils Absolute: 0 10*3/uL (ref 0.0–0.5)
Eosinophils Relative: 0 %
HCT: 43.3 % (ref 39.0–52.0)
Hemoglobin: 14.3 g/dL (ref 13.0–17.0)
Immature Granulocytes: 1 %
Lymphocytes Relative: 20 %
Lymphs Abs: 2.3 10*3/uL (ref 0.7–4.0)
MCH: 31.3 pg (ref 26.0–34.0)
MCHC: 33 g/dL (ref 30.0–36.0)
MCV: 94.7 fL (ref 80.0–100.0)
Monocytes Absolute: 0.7 10*3/uL (ref 0.1–1.0)
Monocytes Relative: 6 %
Neutro Abs: 8.3 10*3/uL — ABNORMAL HIGH (ref 1.7–7.7)
Neutrophils Relative %: 73 %
Platelets: 515 10*3/uL — ABNORMAL HIGH (ref 150–400)
RBC: 4.57 MIL/uL (ref 4.22–5.81)
RDW: 11.7 % (ref 11.5–15.5)
WBC: 11.5 10*3/uL — ABNORMAL HIGH (ref 4.0–10.5)
nRBC: 0.2 % (ref 0.0–0.2)

## 2020-08-30 LAB — LACTATE DEHYDROGENASE: LDH: 404 U/L — ABNORMAL HIGH (ref 98–192)

## 2020-08-30 LAB — COMPREHENSIVE METABOLIC PANEL
ALT: 48 U/L — ABNORMAL HIGH (ref 0–44)
AST: 33 U/L (ref 15–41)
Albumin: 3 g/dL — ABNORMAL LOW (ref 3.5–5.0)
Alkaline Phosphatase: 61 U/L (ref 38–126)
Anion gap: 12 (ref 5–15)
BUN: 10 mg/dL (ref 6–20)
CO2: 27 mmol/L (ref 22–32)
Calcium: 8.5 mg/dL — ABNORMAL LOW (ref 8.9–10.3)
Chloride: 97 mmol/L — ABNORMAL LOW (ref 98–111)
Creatinine, Ser: 0.9 mg/dL (ref 0.61–1.24)
GFR, Estimated: >60 mL/min (ref >60)
Glucose, Bld: 250 mg/dL — ABNORMAL HIGH (ref 70–99)
Potassium: 3.6 mmol/L (ref 3.5–5.1)
Sodium: 136 mmol/L (ref 135–145)
Total Bilirubin: 1.1 mg/dL (ref 0.3–1.2)
Total Protein: 7.7 g/dL (ref 6.5–8.1)

## 2020-08-30 LAB — TRIGLYCERIDES: Triglycerides: 124 mg/dL (ref <150)

## 2020-08-30 LAB — PROCALCITONIN: Procalcitonin: <0.1 ng/mL

## 2020-08-30 LAB — LACTIC ACID, PLASMA: Lactic Acid, Venous: 1.6 mmol/L (ref 0.5–1.9)

## 2020-08-30 LAB — D-DIMER, QUANTITATIVE: D-Dimer, Quant: 0.65 ug/mL-FEU — ABNORMAL HIGH (ref 0.00–0.50)

## 2020-08-30 LAB — FIBRINOGEN: Fibrinogen: >800 mg/dL — ABNORMAL HIGH (ref 210–475)

## 2020-08-30 LAB — C-REACTIVE PROTEIN: CRP: 9.7 mg/dL — ABNORMAL HIGH (ref <1.0)

## 2020-08-30 LAB — FERRITIN: Ferritin: 3266 ng/mL — ABNORMAL HIGH (ref 24–336)

## 2020-08-30 MED ORDER — FAMOTIDINE IN NACL 20-0.9 MG/50ML-% IV SOLN: 20.0000 mg | Freq: Once | INTRAVENOUS | Status: DC | PRN

## 2020-08-30 MED ORDER — SODIUM CHLORIDE 0.9 % IV SOLN: INTRAVENOUS | Status: DC | PRN

## 2020-08-30 MED ORDER — SODIUM CHLORIDE 0.9 % IV SOLN: 1200.0000 mg | Freq: Once | INTRAVENOUS | Status: DC

## 2020-08-30 MED ORDER — EPINEPHRINE 0.3 MG/0.3ML IJ SOAJ: 0.3000 mg | Freq: Once | INTRAMUSCULAR | Status: DC | PRN

## 2020-08-30 MED ORDER — METHYLPREDNISOLONE SODIUM SUCC 125 MG IJ SOLR: 125.0000 mg | Freq: Once | INTRAMUSCULAR | Status: DC | PRN

## 2020-08-30 MED ORDER — DIPHENHYDRAMINE HCL 50 MG/ML IJ SOLN: 50.0000 mg | Freq: Once | INTRAMUSCULAR | Status: DC | PRN

## 2020-08-30 MED ORDER — SODIUM CHLORIDE 0.9 % IV SOLN
Freq: Once | INTRAVENOUS | Status: AC
  Filled 2020-08-30: qty 20

## 2020-08-30 MED ORDER — ALBUTEROL SULFATE HFA 108 (90 BASE) MCG/ACT IN AERS: 2.0000 | INHALATION_SPRAY | Freq: Once | RESPIRATORY_TRACT | Status: DC | PRN

## 2020-08-30 NOTE — ED Provider Notes (Signed)
[facility]-EMERGENCY DEPT Provider Note   CSN: [id number] Arrival date & time: 08/30/20  1530     History Chief Complaint  Patient presents with  . Covid Positive    Jeffery Houston is a 49 y.o. male.  HPI     Patient with diagnosis of coronavirus 6 days ago presents with ongoing dyspnea, fatigue, myalgia. He notes that he became ill about 9 days ago.  Both he and his wife are designated Covid positive here last week. She has received immunoglobulin therapy, but he was awaiting outpatient referral for this, and never received it. He notes that over the past few days he has had worsening overall condition.  Past Medical History:  Diagnosis Date  . Arthritis    bil knees  . Diabetes mellitus without complication (HCC)   . Exertional chest pain 01/23/2019  . Exertional dyspnea 01/23/2019  . Hypertension     Patient Active Problem List   Diagnosis Date Noted  . Exertional dyspnea 01/23/2019  . Laboratory examination 01/23/2019  . Exertional chest pain 01/23/2019  . Primary osteoarthritis of right knee 09/24/2016  . Essential hypertension 09/24/2016  . 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 FOOT SURGERY    . PARTIAL KNEE ARTHROPLASTY Left 07/22/2015   Procedure: LEF PARTIAL KNEE REPLACEMENT ;  Surgeon: Sheral Apley, MD;  Location: [facility];  Service: Orthopedics;  Laterality: Left;  . PARTIAL KNEE ARTHROPLASTY Right 10/05/2016   Procedure: UNICOMPARTMENTAL KNEE;  Surgeon: Sheral Apley, MD;  Location: [facility];  Service: Orthopedics;  Laterality: Right;  Pre/Post Op femoral nerve block  . WRIST SURGERY Right    tendon repair       Family History  Problem Relation Age of Onset  . Heart attack Brother     Social History   Tobacco Use  . Smoking status: Former Smoker    Packs/day: 2.00    Types: Cigarettes    Quit date: 05/14/2010     Years since quitting: 10.3  . Smokeless tobacco: Former Clinical biochemist  . Vaping Use: Never used  Substance Use Topics  . Alcohol use: Yes    Comment: social  . Drug use: No    Home Medications Prior to Admission medications   Medication Sig Start Date End Date Taking? Authorizing Provider  acetaminophen (TYLENOL) 500 MG tablet Take 1,000 mg by mouth daily as needed (shoulder pain).    [provider]  albuterol (PROAIR HFA) 108 (90 Base) MCG/ACT inhaler Inhale 2 puffs into the lungs every 4 (four) hours as needed for wheezing or shortness of breath.    [provider]  amLODipine (NORVASC) 10 MG tablet Take 10 mg by mouth daily. 01/11/15   [provider]  aspirin EC 325 MG tablet Take 1 tablet (325 mg total) by mouth daily. Patient not taking: Reported on 01/23/2019 10/05/16   Albina Billet III, PA-C  aspirin EC 81 MG tablet Take 81 mg by mouth daily.    [provider]  docusate sodium (COLACE) 100 MG capsule Take 1 capsule (100 mg total) by mouth 2 (two) times daily. Patient not taking: Reported on 01/10/2019 10/05/16   Albina Billet III, PA-C  doxycycline (VIBRAMYCIN) 100 MG capsule Take 1 capsule (100 mg total) by mouth 2 (two) times daily. 05/26/19   Hedges, Tinnie Gens, PA-C  esomeprazole (NEXIUM) 40 MG capsule Take 40 mg by  mouth daily as needed (acid reflux).  09/09/18   [provider]  ezetimibe (ZETIA) 10 MG tablet Take 10 mg by mouth daily. 09/09/18   [provider]  gabapentin (NEURONTIN) 300 MG capsule Take 300 mg by mouth 2 (two) times daily.  09/09/18   [provider]  ibuprofen (ADVIL,MOTRIN) 600 MG tablet Take 1 tablet (600 mg total) by mouth every 6 (six) hours as needed for moderate pain. 04/10/16   Charm Rings, MD  Insulin Detemir (LEVEMIR FLEXTOUCH) 100 UNIT/ML Pen Inject 8-10 Units into the skin See admin instructions. Inject 10 units subcutaneously every morning and 8 units at night     [provider]  lisinopril (PRINIVIL,ZESTRIL) 20 MG tablet Take 20 mg by mouth daily. 09/09/18   [provider]  meloxicam (MOBIC) 15 MG tablet Take 15 mg by mouth daily as needed (shoulder pain).  08/07/18   [provider]  Menthol, Topical Analgesic, (ICY HOT EX) Apply 1 application topically at bedtime.    [provider]  metFORMIN (GLUCOPHAGE) 500 MG tablet Take 1 tablet (500 mg total) by mouth 2 (two) times daily with a meal. 05/26/19   Hedges, Tinnie Gens, PA-C  methocarbamol (ROBAXIN) 500 MG tablet Take 1 tablet (500 mg total) by mouth every 6 (six) hours as needed for muscle spasms. Patient not taking: Reported on 01/10/2019 10/05/16   Albina Billet III, PA-C  omeprazole (PRILOSEC) 20 MG capsule Take 1 capsule (20 mg total) by mouth daily. While taking anti inflammatory medicine daily 10/05/16   Albina Billet III, PA-C  ondansetron (ZOFRAN) 4 MG tablet Take 1 tablet (4 mg total) by mouth every 8 (eight) hours as needed for nausea or vomiting. Patient not taking: Reported on 11/02/2016 10/05/16   Albina Billet III, PA-C  oxyCODONE-acetaminophen (PERCOCET) 5-325 MG tablet Take 1-2 tablets by mouth every 4 (four) hours as needed. Patient not taking: Reported on 01/10/2019 02/16/17   Gilda Crease, MD  oxymetazoline (AFRIN NASAL SPRAY) 0.05 % nasal spray Place 1 spray into both nostrils 2 (two) times daily. 01/10/19   Arlyn Dunning, PA-C    Allergies    Latex  Review of Systems   Review of Systems  Constitutional:       Per HPI, otherwise negative  HENT:       Per HPI, otherwise negative  Respiratory:       Per HPI, otherwise negative  Cardiovascular:       Per HPI, otherwise negative  Gastrointestinal: Negative for vomiting.  Endocrine:       Negative aside from HPI  Genitourinary:       Neg aside from HPI   Musculoskeletal:       Per HPI, otherwise negative  Skin: Negative.   Allergic/Immunologic: Negative  for immunocompromised state.  Neurological: Negative for syncope.    Physical Exam Updated Vital Signs BP (!) 127/107   Pulse 84   Temp 98.9 F (37.2 C) (Oral)   Resp (!) 24   SpO2 92%   Physical Exam Vitals and nursing note reviewed.  Constitutional:      Appearance: He is well-developed.     Comments: Uncomfortable appearing adult male  HENT:     Head: Normocephalic and atraumatic.  Eyes:     Conjunctiva/sclera: Conjunctivae normal.  Cardiovascular:     Rate and Rhythm: Normal rate and regular rhythm.  Pulmonary:     Effort: Tachypnea present.  Abdominal:     General: There is  no distension.  Skin:    General: Skin is warm and dry.  Neurological:     Mental Status: He is alert and oriented to person, place, and time.     ED Results / Procedures / Treatments   Labs (all labs ordered are listed, but only abnormal results are displayed) Labs Reviewed  CBC WITH DIFFERENTIAL/PLATELET - Abnormal; Notable for the following components:      Result Value   WBC 11.5 (*)    Platelets 515 (*)    Neutro Abs 8.3 (*)    All other components within normal limits  COMPREHENSIVE METABOLIC PANEL - Abnormal; Notable for the following components:   Chloride 97 (*)    Glucose, Bld 250 (*)    Calcium 8.5 (*)    Albumin 3.0 (*)    ALT 48 (*)    All other components within normal limits  D-DIMER, QUANTITATIVE (NOT AT [facility]) - Abnormal; Notable for the following components:   D-Dimer, Quant 0.65 (*)    All other components within normal limits  LACTATE DEHYDROGENASE - Abnormal; Notable for the following components:   LDH 404 (*)    All other components within normal limits  FERRITIN - Abnormal; Notable for the following components:   Ferritin 3,266 (*)    All other components within normal limits  FIBRINOGEN - Abnormal; Notable for the following components:   Fibrinogen >800 (*)    All other components within normal limits  C-REACTIVE PROTEIN - Abnormal; Notable for the  following components:   CRP 9.7 (*)    All other components within normal limits  CULTURE, BLOOD (ROUTINE X 2)  CULTURE, BLOOD (ROUTINE X 2)  LACTIC ACID, PLASMA  PROCALCITONIN  TRIGLYCERIDES  LACTIC ACID, PLASMA    EKG None  Radiology DG Chest Port 1 View  Result Date: 08/30/2020 CLINICAL DATA:  Shortness of breath, COVID-19 positivity EXAM: PORTABLE CHEST 1 VIEW COMPARISON:  08/25/2020 FINDINGS: Cardiac shadow is stable. Lungs are well aerated. Slight increase in the degree of bilateral airspace opacities is noted when compare with the prior study consistent with the given clinical history of COVID-19 positivity. No bony abnormality is seen. IMPRESSION: Increase in the degree of airspace opacities bilaterally consistent with given clinical history of COVID-19 positivity. Electronically Signed   By: Alcide Clever M.D.   On: 08/30/2020 17:02    Procedures Procedures (including critical care time)  Medications Ordered in ED Medications  0.9 %  sodium chloride infusion (has no administration in time range)  diphenhydrAMINE (BENADRYL) injection 50 mg (has no administration in time range)  famotidine (PEPCID) IVPB 20 mg premix (has no administration in time range)  methylPREDNISolone sodium succinate (SOLU-MEDROL) 125 mg/2 mL injection 125 mg (has no administration in time range)  albuterol (VENTOLIN HFA) 108 (90 Base) MCG/ACT inhaler 2 puff (has no administration in time range)  EPINEPHrine (EPI-PEN) injection 0.3 mg (has no administration in time range)  bamlanivimab 700 mg, etesevimab 1,400 mg in sodium chloride 0.9 % 160 mL IVPB ( Intravenous New Bag/Given 08/30/20 1730)    ED Course  I have reviewed the triage vital signs and the nursing notes.  Pertinent labs & imaging results that were available during my care of the patient were reviewed by me and considered in my medical decision making (see chart for details).  Update:, Labs, x-ray, consistent with known coronavirus  infection. Patient other labs are reassuring, no substantial electrolyte abnormalities.  8:07 PM Patient has completed monoclonal antibody therapy.  Oxygen saturation  remain similar, 92, 93% without supplemental oxygen.  Adult male with known Covid infection presents with ongoing/worsening symptoms. Patient has no new oxygen requirement, received appropriate therapy here and was discharged in stable condition.  Final Clinical Impression(s) / ED Diagnoses Final diagnoses:  Pneumonia due to COVID-19 virus   MDM Number of Diagnoses or Management Options Pneumonia due to COVID-19 virus: established, worsening   Amount and/or Complexity of Data Reviewed Clinical lab tests: reviewed Tests in the radiology section of CPT: reviewed Tests in the medicine section of CPT: reviewed Decide to obtain previous medical records or to obtain history from someone other than the patient: yes Review and summarize past medical records: yes Discuss the patient with other providers: no Independent visualization of images, tracings, or specimens: yes  Risk of Complications, Morbidity, and/or Mortality Presenting problems: high Diagnostic procedures: high Management options: high  Critical Care Total time providing critical care: < 30 minutes  Patient Progress Patient progress: stable     Gerhard MunchLockwood, Jomel Whittlesey, MD 08/30/20 2010

## 2020-08-30 NOTE — ED Triage Notes (Signed)
Per EMS-states he has had covid for 10 days-states no complaints just wants infusion-

## 2020-08-30 NOTE — Discharge Instructions (Addendum)
As discussed, your evaluation today has been largely reassuring.  But, it is important that you monitor your condition carefully, and do not hesitate to return to the ED if you develop new, or concerning changes in your condition. ? ?Otherwise, please follow-up with your physician for appropriate ongoing care. ? ?

## 2020-09-01 ENCOUNTER — Ambulatory Visit (INDEPENDENT_AMBULATORY_CARE_PROVIDER_SITE_OTHER): Payer: Medicaid Other | Admitting: Nurse Practitioner

## 2020-09-01 VITALS — BP 118/88 | HR 88

## 2020-09-01 DIAGNOSIS — R0602 Shortness of breath: Secondary | ICD-10-CM

## 2020-09-01 DIAGNOSIS — U071 COVID-19: Secondary | ICD-10-CM

## 2020-09-01 MED ORDER — PREDNISONE 20 MG PO TABS: 20.0000 mg | ORAL_TABLET | Freq: Every day | ORAL | 0 refills | Status: AC

## 2020-09-01 MED ORDER — AZITHROMYCIN 250 MG PO TABS: ORAL_TABLET | ORAL | 0 refills | Status: DC

## 2020-09-01 NOTE — Assessment & Plan Note (Signed)
Cough Shortness of breath:  Please use albuterol inhaler twice daily  Stay well hydrated  Stay active  Deep breathing exercises  May start vitamin C 2,000 mg daily, vitamin D3 2,000 IU daily, Zinc 220 mg daily, and Quercetin 500 mg twice daily  May take tylenol or fever or pain  May take mucinex DM twice daily  Will order azithromycin  Will order prednisone - keep close check on blood sugar  Keep close check on O2 sats - Patient walked in office today - O2 sats dropped to 85% on RA and returned to 96% on 3 L O2. Will order 3L O2 with exertion and 2L [place] at rest   Follow up:  Follow up in 1 week - go to the ED if symptoms worsen

## 2020-09-01 NOTE — Progress Notes (Signed)
@Patient  ID: , male    DOB: 1971-06-16, 49 y.o.   MRN: [id number]  Chief Complaint  Patient presents with  . Covid Positive    Recieved infusion, SOB    Referring provider: [id number], FNP   49 year old male with history of hypertension, diabetes.  Patient was diagnosed with Covid on 08/25/2020.  HPI  Patient presents today for post COVID care clinic visit/ED follow-up.  Patient was seen in the ED 2 days ago.  He did receive monoclonal antibody infusion.  Patient was previously seen in the ED on 08/25/2020.  His chest x-ray on 08/30/2020 did show worsening Covid pneumonia.  Patient states that he is still continue to have shortness of breath.  He does have an albuterol inhaler but has not used this.  Patient was walked in the office today and O2 sats did drop to 85% on room air.  Patient was placed on 3 L of O2 and sats came up to 96%.  We will order O2 to be brought out to the home. Denies f/c/s, n/v/d, hemoptysis, PND, chest pain or edema.      Allergies  Allergen Reactions  . Latex Hives    Immunization History  Administered Date(s) Administered  . Tdap 04/11/2013, 05/26/2019    Past Medical History:  Diagnosis Date  . Arthritis    bil knees  . Diabetes mellitus without complication (HCC)   . Exertional chest pain 01/23/2019  . Exertional dyspnea 01/23/2019  . Hypertension     Tobacco History: Social History   Tobacco Use  Smoking Status Former Smoker  . Packs/day: 2.00  . Types: Cigarettes  . Quit date: 05/14/2010  . Years since quitting: 10.3  Smokeless Tobacco Former 05/16/2010   Counseling given: Not Answered   Outpatient Encounter Medications as of 09/01/2020  Medication Sig  . acetaminophen (TYLENOL) 500 MG tablet Take 1,000 mg by mouth daily as needed (shoulder pain).  09/03/2020 albuterol (PROAIR HFA) 108 (90 Base) MCG/ACT inhaler Inhale 2 puffs into the lungs every 4 (four) hours as needed for wheezing or shortness of breath.  Marland Kitchen amLODipine (NORVASC)  10 MG tablet Take 10 mg by mouth daily.  Marland Kitchen aspirin EC 81 MG tablet Take 81 mg by mouth daily.  Marland Kitchen ezetimibe (ZETIA) 10 MG tablet Take 10 mg by mouth daily.  Marland Kitchen gabapentin (NEURONTIN) 300 MG capsule Take 300 mg by mouth 2 (two) times daily.   . Insulin Detemir (LEVEMIR FLEXTOUCH) 100 UNIT/ML Pen Inject 8-10 Units into the skin See admin instructions. Inject 10 units subcutaneously every morning and 8 units at night  . lisinopril (PRINIVIL,ZESTRIL) 20 MG tablet Take 20 mg by mouth daily.  . meloxicam (MOBIC) 15 MG tablet Take 15 mg by mouth daily as needed (shoulder pain).   . Menthol, Topical Analgesic, (ICY HOT EX) Apply 1 application topically at bedtime.  . methocarbamol (ROBAXIN) 500 MG tablet Take 1 tablet (500 mg total) by mouth every 6 (six) hours as needed for muscle spasms.  Marland Kitchen azithromycin (ZITHROMAX) 250 MG tablet Take 2 tablets (500 mg) on day 1, then take 1 tablet (250 mg) on days 2-5  . esomeprazole (NEXIUM) 40 MG capsule Take 40 mg by mouth daily as needed (acid reflux).  (Patient not taking: Reported on 09/01/2020)  . ibuprofen (ADVIL,MOTRIN) 600 MG tablet Take 1 tablet (600 mg total) by mouth every 6 (six) hours as needed for moderate pain. (Patient not taking: Reported on 09/01/2020)  . metFORMIN (GLUCOPHAGE) 500 MG tablet  Take 1 tablet (500 mg total) by mouth 2 (two) times daily with a meal. (Patient not taking: Reported on 09/01/2020)  . omeprazole (PRILOSEC) 20 MG capsule Take 1 capsule (20 mg total) by mouth daily. While taking anti inflammatory medicine daily (Patient not taking: Reported on 09/01/2020)  . ondansetron (ZOFRAN) 4 MG tablet Take 1 tablet (4 mg total) by mouth every 8 (eight) hours as needed for nausea or vomiting. (Patient not taking: Reported on 11/02/2016)  . oxyCODONE-acetaminophen (PERCOCET) 5-325 MG tablet Take 1-2 tablets by mouth every 4 (four) hours as needed. (Patient not taking: Reported on 01/10/2019)  . oxymetazoline (AFRIN NASAL SPRAY) 0.05 % nasal spray  Place 1 spray into both nostrils 2 (two) times daily. (Patient not taking: Reported on 09/01/2020)  . predniSONE (DELTASONE) 20 MG tablet Take 1 tablet (20 mg total) by mouth daily with breakfast for 5 days.  . [DISCONTINUED] aspirin EC 325 MG tablet Take 1 tablet (325 mg total) by mouth daily. (Patient not taking: Reported on 01/23/2019)  . [DISCONTINUED] docusate sodium (COLACE) 100 MG capsule Take 1 capsule (100 mg total) by mouth 2 (two) times daily. (Patient not taking: Reported on 01/10/2019)  . [DISCONTINUED] doxycycline (VIBRAMYCIN) 100 MG capsule Take 1 capsule (100 mg total) by mouth 2 (two) times daily.   No facility-administered encounter medications on file as of 09/01/2020.     Review of Systems  Review of Systems  Constitutional: Positive for fatigue. Negative for fever.  HENT: Negative.   Respiratory: Positive for cough and shortness of breath. Negative for wheezing.   Cardiovascular: Negative.  Negative for chest pain, palpitations and leg swelling.  Gastrointestinal: Negative.   Allergic/Immunologic: Negative.   Neurological: Negative.   Psychiatric/Behavioral: Negative.        Physical Exam  BP 118/88 (BP Location: Left Arm)   Pulse 88   SpO2 91%   Wt Readings from Last 5 Encounters:  01/23/19 273 lb 6.4 oz (124 kg)  10/05/16 270 lb 2 oz (122.5 kg)  07/22/15 287 lb 4 oz (130.3 kg)  07/11/15 293 lb (132.9 kg)  04/01/15 283 lb (128.4 kg)     Physical Exam Vitals and nursing note reviewed.  Constitutional:      General: He is not in acute distress.    Appearance: He is well-developed.  Cardiovascular:     Rate and Rhythm: Normal rate and regular rhythm.  Pulmonary:     Effort: Pulmonary effort is normal.     Comments: Lung sounds diminished. Musculoskeletal:     Right lower leg: No edema.     Left lower leg: No edema.  Skin:    General: Skin is warm and dry.  Neurological:     Mental Status: He is alert and oriented to person, place, and time.   Psychiatric:        Mood and Affect: Mood normal.        Behavior: Behavior normal.       Imaging: DG Chest Port 1 View  Result Date: 08/30/2020 CLINICAL DATA:  Shortness of breath, COVID-19 positivity EXAM: PORTABLE CHEST 1 VIEW COMPARISON:  08/25/2020 FINDINGS: Cardiac shadow is stable. Lungs are well aerated. Slight increase in the degree of bilateral airspace opacities is noted when compare with the prior study consistent with the given clinical history of COVID-19 positivity. No bony abnormality is seen. IMPRESSION: Increase in the degree of airspace opacities bilaterally consistent with given clinical history of COVID-19 positivity. Electronically Signed   By: Eulah Pont.D.  On: 08/30/2020 17:02   DG Chest Port 1 View  Result Date: 08/25/2020 CLINICAL DATA:  Cough and fever EXAM: PORTABLE CHEST 1 VIEW COMPARISON:  July 04, 2008 FINDINGS: There are hazy bilateral airspace opacities. The lung volumes are low. There is no pneumothorax. No large pleural effusion. The heart size is borderline enlarged which may be secondary to the low lung volumes. IMPRESSION: Bilateral hazy airspace opacities compatible with multifocal pneumonia (viral or bacterial). Electronically Signed   By: Katherine Mantle M.D.   On: 08/25/2020 22:36     Assessment & Plan:   COVID-19 Cough Shortness of breath:  Please use albuterol inhaler twice daily  Stay well hydrated  Stay active  Deep breathing exercises  May start vitamin C 2,000 mg daily, vitamin D3 2,000 IU daily, Zinc 220 mg daily, and Quercetin 500 mg twice daily  May take tylenol or fever or pain  May take mucinex DM twice daily  Will order azithromycin  Will order prednisone - keep close check on blood sugar  Keep close check on O2 sats - Patient walked in office today - O2 sats dropped to 85% on RA and returned to 96% on 3 L O2. Will order 3L O2 with exertion and 2L [place] [employer] at rest   Follow up:  Follow up in 1 week - go to  the ED if symptoms worsen      Ivonne Andrew, NP 09/01/2020

## 2020-09-01 NOTE — Patient Instructions (Addendum)
Covid 19 Cough Shortness of breath:  Please use albuterol inhaler twice daily  Stay well hydrated  Stay active  Deep breathing exercises  May start vitamin C 2,000 mg daily, vitamin D3 2,000 IU daily, Zinc 220 mg daily, and Quercetin 500 mg twice daily  May take tylenol or fever or pain  May take mucinex DM twice daily  Will order azithromycin  Will order prednisone - keep close check on blood sugar  Keep close check on O2 sats - Patient walked in office today - O2 sats dropped to 85% on RA and returned to 96% on 3 L O2. Will order 3L O2 with exertion and 2L [place] at rest   Follow up:  Follow up in 1 week - go to the ED if symptoms worsen

## 2020-09-04 LAB — CULTURE, BLOOD (ROUTINE X 2)
Culture: NO GROWTH
Culture: NO GROWTH
Special Requests: ADEQUATE

## 2020-09-08 ENCOUNTER — Other Ambulatory Visit: Payer: Self-pay

## 2020-09-08 ENCOUNTER — Ambulatory Visit
Admission: RE | Admit: 2020-09-08 | Discharge: 2020-09-08 | Disposition: A | Discharge status: Home / Self Care | Payer: Medicaid Other | Source: Ambulatory Visit | Attending: Nurse Practitioner | Admitting: Nurse Practitioner

## 2020-09-08 ENCOUNTER — Ambulatory Visit (INDEPENDENT_AMBULATORY_CARE_PROVIDER_SITE_OTHER): Payer: Medicaid Other | Admitting: Nurse Practitioner

## 2020-09-08 VITALS — BP 128/80 | HR 90 | Temp 96.9°F | Ht 72.0 in | Wt 268.0 lb

## 2020-09-08 DIAGNOSIS — U071 COVID-19: Secondary | ICD-10-CM

## 2020-09-08 DIAGNOSIS — J1282 Pneumonia due to coronavirus disease 2019: Secondary | ICD-10-CM

## 2020-09-08 DIAGNOSIS — J9601 Acute respiratory failure with hypoxia: Secondary | ICD-10-CM | POA: Diagnosis not present

## 2020-09-08 NOTE — Progress Notes (Signed)
@Patient  ID: , male    DOB: Feb 15, 1971, 49 y.o.   MRN: [id number]  Chief Complaint  Patient presents with  . Follow-up    1 week follow, Still SOB on 2-3L during the day.     Referring provider: [id number], FNP   49 year old male with history of hypertension, diabetes.  Patient was diagnosed with Covid on 08/25/2020.  HPI  Patient presents today for post COVID care clinic visit follow-up.  Patient was last seen in our office on 09/01/2020.  At last visit he was walked in office and O2 sats dropped to 85%.  Patient was started on O2 at 3 L with exertion and 2 L at rest.  He was treated with azithromycin and prednisone.  Patient states that he is much improved.  He does still require oxygen and has been checking his O2 sats at home.  He states that his O2 sats are dropping during the night.  Patient does have a history of heavy snoring.  We discussed that we will place a referral to pulmonary that patient may need sleep study or ONO. Denies f/c/s, n/v/d, hemoptysis, PND, chest pain or edema.        Allergies  Allergen Reactions  . Latex Hives    Immunization History  Administered Date(s) Administered  . Tdap 04/11/2013, 05/26/2019    Past Medical History:  Diagnosis Date  . Arthritis    bil knees  . Diabetes mellitus without complication (HCC)   . Exertional chest pain 01/23/2019  . Exertional dyspnea 01/23/2019  . Hypertension     Tobacco History: Social History   Tobacco Use  Smoking Status Former Smoker  . Packs/day: 2.00  . Types: Cigarettes  . Quit date: 05/14/2010  . Years since quitting: 10.3  Smokeless Tobacco Former 05/16/2010   Counseling given: Yes   Outpatient Encounter Medications as of 09/08/2020  Medication Sig  . acetaminophen (TYLENOL) 500 MG tablet Take 1,000 mg by mouth daily as needed (shoulder pain).  09/10/2020 albuterol (PROAIR HFA) 108 (90 Base) MCG/ACT inhaler Inhale 2 puffs into the lungs every 4 (four) hours as needed for wheezing or  shortness of breath.  Marland Kitchen amLODipine (NORVASC) 10 MG tablet Take 10 mg by mouth daily.  Marland Kitchen aspirin EC 81 MG tablet Take 81 mg by mouth daily.  Marland Kitchen esomeprazole (NEXIUM) 40 MG capsule Take 40 mg by mouth daily as needed (acid reflux).   Marland Kitchen ezetimibe (ZETIA) 10 MG tablet Take 10 mg by mouth daily.  Marland Kitchen gabapentin (NEURONTIN) 300 MG capsule Take 300 mg by mouth 2 (two) times daily.   Marland Kitchen ibuprofen (ADVIL,MOTRIN) 600 MG tablet Take 1 tablet (600 mg total) by mouth every 6 (six) hours as needed for moderate pain.  . Insulin Detemir (LEVEMIR FLEXTOUCH) 100 UNIT/ML Pen Inject 8-10 Units into the skin See admin instructions. Inject 10 units subcutaneously every morning and 8 units at night  . lisinopril (PRINIVIL,ZESTRIL) 20 MG tablet Take 20 mg by mouth daily.  . meloxicam (MOBIC) 15 MG tablet Take 15 mg by mouth daily as needed (shoulder pain).   . Menthol, Topical Analgesic, (ICY HOT EX) Apply 1 application topically at bedtime.  . metFORMIN (GLUCOPHAGE) 500 MG tablet Take 1 tablet (500 mg total) by mouth 2 (two) times daily with a meal.  . methocarbamol (ROBAXIN) 500 MG tablet Take 1 tablet (500 mg total) by mouth every 6 (six) hours as needed for muscle spasms.  Marland Kitchen omeprazole (PRILOSEC) 20 MG capsule Take 1  capsule (20 mg total) by mouth daily. While taking anti inflammatory medicine daily  . ondansetron (ZOFRAN) 4 MG tablet Take 1 tablet (4 mg total) by mouth every 8 (eight) hours as needed for nausea or vomiting.  Marland Kitchen oxyCODONE-acetaminophen (PERCOCET) 5-325 MG tablet Take 1-2 tablets by mouth every 4 (four) hours as needed.  Marland Kitchen oxymetazoline (AFRIN NASAL SPRAY) 0.05 % nasal spray Place 1 spray into both nostrils 2 (two) times daily.  . [DISCONTINUED] azithromycin (ZITHROMAX) 250 MG tablet Take 2 tablets (500 mg) on day 1, then take 1 tablet (250 mg) on days 2-5   No facility-administered encounter medications on file as of 09/08/2020.     Review of Systems  Review of Systems  Constitutional: Negative.   Negative for fever.  HENT: Negative.   Respiratory: Positive for cough and shortness of breath.   Cardiovascular: Negative.  Negative for chest pain, palpitations and leg swelling.  Gastrointestinal: Negative.   Allergic/Immunologic: Negative.   Neurological: Negative.   Psychiatric/Behavioral: Negative.        Physical Exam  BP 128/80 (BP Location: Left Arm)   Pulse 90   Temp (!) 96.9 F (36.1 C)   Ht 6' (1.829 m)   Wt 268 lb 0.1 oz (121.6 kg)   SpO2 98%   BMI 36.35 kg/m   Wt Readings from Last 5 Encounters:  09/08/20 268 lb 0.1 oz (121.6 kg)  01/23/19 273 lb 6.4 oz (124 kg)  10/05/16 270 lb 2 oz (122.5 kg)  07/22/15 287 lb 4 oz (130.3 kg)  07/11/15 293 lb (132.9 kg)     Physical Exam Vitals and nursing note reviewed.  Constitutional:      General: He is not in acute distress.    Appearance: He is well-developed.  Cardiovascular:     Rate and Rhythm: Normal rate and regular rhythm.  Pulmonary:     Effort: Pulmonary effort is normal.     Breath sounds: Normal breath sounds.  Musculoskeletal:     Right lower leg: No edema.     Left lower leg: No edema.  Skin:    General: Skin is warm and dry.  Neurological:     Mental Status: He is alert and oriented to person, place, and time.  Psychiatric:        Mood and Affect: Mood normal.        Behavior: Behavior normal.       Imaging: DG Chest Port 1 View  Result Date: 08/30/2020 CLINICAL DATA:  Shortness of breath, COVID-19 positivity EXAM: PORTABLE CHEST 1 VIEW COMPARISON:  08/25/2020 FINDINGS: Cardiac shadow is stable. Lungs are well aerated. Slight increase in the degree of bilateral airspace opacities is noted when compare with the prior study consistent with the given clinical history of COVID-19 positivity. No bony abnormality is seen. IMPRESSION: Increase in the degree of airspace opacities bilaterally consistent with given clinical history of COVID-19 positivity. Electronically Signed   By: Alcide Clever  M.D.   On: 08/30/2020 17:02   DG Chest Port 1 View  Result Date: 08/25/2020 CLINICAL DATA:  Cough and fever EXAM: PORTABLE CHEST 1 VIEW COMPARISON:  July 04, 2008 FINDINGS: There are hazy bilateral airspace opacities. The lung volumes are low. There is no pneumothorax. No large pleural effusion. The heart size is borderline enlarged which may be secondary to the low lung volumes. IMPRESSION: Bilateral hazy airspace opacities compatible with multifocal pneumonia (viral or bacterial). Electronically Signed   By: Katherine Mantle M.D.   On: 08/25/2020  22:36     Assessment & Plan:   Pneumonia due to COVID-19 virus Cough Shortness of breath:   Stay well hydrated  Stay active  Deep breathing exercises  May take tylenol or fever or pain  May take mucinex DM twice daily  Will order chest x ray  Will place referral to pulmonary - may need HST / ONO  May continue O2 at 3L [place] with exertion and 2 L at rest - may wean to keep sats above 92%     Follow up:  Follow up as needed      Ivonne Andrew, NP 09/08/2020

## 2020-09-08 NOTE — Patient Instructions (Addendum)
Covid 19 Cough Shortness of breath:   Stay well hydrated  Stay active  Deep breathing exercises  May take tylenol or fever or pain  May take mucinex DM twice daily  Will order chest x ray  Will place referral to pulmonary - may need HST / ONO  May continue O2 at 3L [place] with exertion and 2 L at rest - may wean to keep sats above 92%     Follow up:  Follow up as needed

## 2020-09-08 NOTE — Assessment & Plan Note (Signed)
Cough Shortness of breath:   Stay well hydrated  Stay active  Deep breathing exercises  May take tylenol or fever or pain  May take mucinex DM twice daily  Will order chest x ray  Will place referral to pulmonary - may need HST / ONO  May continue O2 at 3L [place] with exertion and 2 L at rest - may wean to keep sats above 92%     Follow up:  Follow up as needed

## 2020-09-08 NOTE — Progress Notes (Deleted)
@Patient  ID: , male    DOB: 1971-10-11, 49 y.o.   MRN: [id number]  Chief Complaint  Patient presents with  . Follow-up    1 week follow, Still SOB on 2-3L during the day.     Referring provider: [id number], FNP  HPI: HPI   Recent [facility] Encounters:     Tests:  Imaging:   Cardiac:   Labs:   Micro:   Chart Review:       Allergies  Allergen Reactions  . Latex Hives    Immunization History  Administered Date(s) Administered  . Tdap 04/11/2013, 05/26/2019    Past Medical History:  Diagnosis Date  . Arthritis    bil knees  . Diabetes mellitus without complication (HCC)   . Exertional chest pain 01/23/2019  . Exertional dyspnea 01/23/2019  . Hypertension     Tobacco History: Social History   Tobacco Use  Smoking Status Former Smoker  . Packs/day: 2.00  . Types: Cigarettes  . Quit date: 05/14/2010  . Years since quitting: 10.3  Smokeless Tobacco Former 05/16/2010   Counseling given: Not Answered   Outpatient Encounter Medications as of 09/08/2020  Medication Sig  . acetaminophen (TYLENOL) 500 MG tablet Take 1,000 mg by mouth daily as needed (shoulder pain).  09/10/2020 albuterol (PROAIR HFA) 108 (90 Base) MCG/ACT inhaler Inhale 2 puffs into the lungs every 4 (four) hours as needed for wheezing or shortness of breath.  Marland Kitchen amLODipine (NORVASC) 10 MG tablet Take 10 mg by mouth daily.  Marland Kitchen aspirin EC 81 MG tablet Take 81 mg by mouth daily.  Marland Kitchen esomeprazole (NEXIUM) 40 MG capsule Take 40 mg by mouth daily as needed (acid reflux).   Marland Kitchen ezetimibe (ZETIA) 10 MG tablet Take 10 mg by mouth daily.  Marland Kitchen gabapentin (NEURONTIN) 300 MG capsule Take 300 mg by mouth 2 (two) times daily.   Marland Kitchen ibuprofen (ADVIL,MOTRIN) 600 MG tablet Take 1 tablet (600 mg total) by mouth every 6 (six) hours as needed for moderate pain.  . Insulin Detemir (LEVEMIR FLEXTOUCH) 100 UNIT/ML Pen Inject 8-10 Units into the skin See admin instructions. Inject 10 units subcutaneously every  morning and 8 units at night  . lisinopril (PRINIVIL,ZESTRIL) 20 MG tablet Take 20 mg by mouth daily.  . meloxicam (MOBIC) 15 MG tablet Take 15 mg by mouth daily as needed (shoulder pain).   . Menthol, Topical Analgesic, (ICY HOT EX) Apply 1 application topically at bedtime.  . metFORMIN (GLUCOPHAGE) 500 MG tablet Take 1 tablet (500 mg total) by mouth 2 (two) times daily with a meal.  . methocarbamol (ROBAXIN) 500 MG tablet Take 1 tablet (500 mg total) by mouth every 6 (six) hours as needed for muscle spasms.  Marland Kitchen omeprazole (PRILOSEC) 20 MG capsule Take 1 capsule (20 mg total) by mouth daily. While taking anti inflammatory medicine daily  . ondansetron (ZOFRAN) 4 MG tablet Take 1 tablet (4 mg total) by mouth every 8 (eight) hours as needed for nausea or vomiting.  Marland Kitchen oxyCODONE-acetaminophen (PERCOCET) 5-325 MG tablet Take 1-2 tablets by mouth every 4 (four) hours as needed.  Marland Kitchen oxymetazoline (AFRIN NASAL SPRAY) 0.05 % nasal spray Place 1 spray into both nostrils 2 (two) times daily.  . [DISCONTINUED] azithromycin (ZITHROMAX) 250 MG tablet Take 2 tablets (500 mg) on day 1, then take 1 tablet (250 mg) on days 2-5   No facility-administered encounter medications on file as of 09/08/2020.     Review of Systems  Review of  Systems     Physical Exam  BP 128/80 (BP Location: Left Arm)   Pulse 90   Temp (!) 96.9 F (36.1 C)   Ht 6' (1.829 m)   Wt 268 lb 0.1 oz (121.6 kg)   SpO2 98%   BMI 36.35 kg/m   Wt Readings from Last 5 Encounters:  09/08/20 268 lb 0.1 oz (121.6 kg)  01/23/19 273 lb 6.4 oz (124 kg)  10/05/16 270 lb 2 oz (122.5 kg)  07/22/15 287 lb 4 oz (130.3 kg)  07/11/15 293 lb (132.9 kg)     Physical Exam   Lab Results:  CBC    Component Value Date/Time   WBC 11.5 (H) 08/30/2020 1630   RBC 4.57 08/30/2020 1630   HGB 14.3 08/30/2020 1630   HCT 43.3 08/30/2020 1630   PLT 515 (H) 08/30/2020 1630   MCV 94.7 08/30/2020 1630   MCH 31.3 08/30/2020 1630   MCHC 33.0  08/30/2020 1630   RDW 11.7 08/30/2020 1630   LYMPHSABS 2.3 08/30/2020 1630   MONOABS 0.7 08/30/2020 1630   EOSABS 0.0 08/30/2020 1630   BASOSABS 0.0 08/30/2020 1630    BMET    Component Value Date/Time   NA 136 08/30/2020 1630   K 3.6 08/30/2020 1630   CL 97 (L) 08/30/2020 1630   CO2 27 08/30/2020 1630   GLUCOSE 250 (H) 08/30/2020 1630   BUN 10 08/30/2020 1630   CREATININE 0.90 08/30/2020 1630   CALCIUM 8.5 (L) 08/30/2020 1630   GFRNONAA >60 08/30/2020 1630   GFRAA >60 09/28/2016 1300    BNP No results found for: BNP  ProBNP No results found for: PROBNP  Imaging: DG Chest Port 1 View  Result Date: 08/30/2020 CLINICAL DATA:  Shortness of breath, COVID-19 positivity EXAM: PORTABLE CHEST 1 VIEW COMPARISON:  08/25/2020 FINDINGS: Cardiac shadow is stable. Lungs are well aerated. Slight increase in the degree of bilateral airspace opacities is noted when compare with the prior study consistent with the given clinical history of COVID-19 positivity. No bony abnormality is seen. IMPRESSION: Increase in the degree of airspace opacities bilaterally consistent with given clinical history of COVID-19 positivity. Electronically Signed   By: Alcide Clever M.D.   On: 08/30/2020 17:02   DG Chest Port 1 View  Result Date: 08/25/2020 CLINICAL DATA:  Cough and fever EXAM: PORTABLE CHEST 1 VIEW COMPARISON:  July 04, 2008 FINDINGS: There are hazy bilateral airspace opacities. The lung volumes are low. There is no pneumothorax. No large pleural effusion. The heart size is borderline enlarged which may be secondary to the low lung volumes. IMPRESSION: Bilateral hazy airspace opacities compatible with multifocal pneumonia (viral or bacterial). Electronically Signed   By: Katherine Mantle M.D.   On: 08/25/2020 22:36     Assessment & Plan:   No problem-specific Assessment & Plan notes found for this encounter.     Ivonne Andrew, NP 09/08/2020

## 2020-09-09 ENCOUNTER — Telehealth: Payer: Self-pay | Admitting: Nurse Practitioner

## 2020-09-09 NOTE — Telephone Encounter (Signed)
Issues with oxygen staying on ears. States that it comes off at night. Would like mask if possible

## 2020-09-13 ENCOUNTER — Other Ambulatory Visit: Payer: Self-pay | Admitting: Nurse Practitioner

## 2020-09-13 MED ORDER — AZITHROMYCIN 250 MG PO TABS: ORAL_TABLET | ORAL | 0 refills | Status: AC

## 2020-09-13 MED ORDER — PREDNISONE 10 MG PO TABS: ORAL_TABLET | ORAL | 0 refills | Status: DC

## 2020-09-13 NOTE — Telephone Encounter (Signed)
Please address at upcoming appointment with pulmonary.

## 2020-09-13 NOTE — Telephone Encounter (Signed)
Patient does not have upcoming appointment with Pulmonary. Spoke with patient requested I send [facility] number through MyChart so that he can set that up.

## 2020-09-19 ENCOUNTER — Encounter: Payer: Self-pay | Admitting: Nurse Practitioner

## 2020-09-19 ENCOUNTER — Telehealth (INDEPENDENT_AMBULATORY_CARE_PROVIDER_SITE_OTHER): Payer: Medicaid Other | Admitting: Nurse Practitioner

## 2020-09-19 ENCOUNTER — Other Ambulatory Visit: Payer: Self-pay

## 2020-09-19 DIAGNOSIS — I10 Essential (primary) hypertension: Secondary | ICD-10-CM

## 2020-09-19 DIAGNOSIS — M25519 Pain in unspecified shoulder: Secondary | ICD-10-CM | POA: Diagnosis not present

## 2020-09-19 DIAGNOSIS — J1282 Pneumonia due to coronavirus disease 2019: Secondary | ICD-10-CM

## 2020-09-19 DIAGNOSIS — U071 COVID-19: Secondary | ICD-10-CM

## 2020-09-19 DIAGNOSIS — E1165 Type 2 diabetes mellitus with hyperglycemia: Secondary | ICD-10-CM

## 2020-09-19 MED ORDER — METFORMIN HCL 500 MG PO TABS: 500.0000 mg | ORAL_TABLET | Freq: Two times a day (BID) | ORAL | 3 refills | Status: DC

## 2020-09-19 MED ORDER — EZETIMIBE 10 MG PO TABS: 10.0000 mg | ORAL_TABLET | Freq: Every day | ORAL | 3 refills | Status: DC

## 2020-09-19 NOTE — Patient Instructions (Signed)
Home Oxygen Use, Adult When a medical condition keeps you from getting enough oxygen, your health care provider may instruct you to take extra oxygen at home. Your health care provider will let you know:  When to take oxygen.  For how long to take oxygen.  How quickly oxygen should be delivered (flow rate), in liters per minute (LPM or L/M). Home oxygen can be given through:  A mask.  A nasal cannula. This is a device or tube that goes in the nostrils.  A transtracheal catheter. This is a small, flexible tube placed in the trachea.  A tracheostomy. This is a surgically made opening in the trachea. These devices are connected with tubing to an oxygen source, such as:  A tank. Tanks hold oxygen in gas form. They must be replaced when the oxygen is used up.  A liquid oxygen device. This holds oxygen in liquid form. It must be replaced when the oxygen is used up.  An oxygen concentrator machine. This filters oxygen in the room. It uses electricity, so you must have a backup cylinder of oxygen in case the power goes out. Supplies needed: To use oxygen, you will need:  A mask, nasal cannula, transtracheal catheter, or tracheostomy.  An oxygen tank, a liquid oxygen device, or an oxygen concentrator.  The tape that your health care provider recommends (optional). If you use a transtracheal catheter and your prescribed flow rate is 1 LPM or greater, you will also need a humidifier. Risks and complications  Fire. This can happen if the oxygen is exposed to a heat source, flame, or spark.  Injury to skin. This can happen if liquid oxygen touches your skin.  Organ damage. This can happen if you get too little oxygen. How to use oxygen Your health care provider or a representative from your medical device company will show you how to use your oxygen device. Follow her or his instructions. The instructions may look something like this: 1. Wash your hands. 2. If you use an oxygen  concentrator, make sure it is plugged in. 3. Place one end of the tube into the port on the tank, device, or machine. 4. Place the mask over your nose and mouth. Or, place the nasal cannula and secure it with tape if instructed. If you use a tracheostomy or transtracheal catheter, connect it to the oxygen source as directed. 5. Make sure the liter-flow setting on the machine is at the level prescribed by your health care provider. 6. Turn on the machine or adjust the knob on the tank or device to the correct liter-flow setting. 7. When you are done, turn off and unplug the machine, or turn the knob to OFF. How to clean and care for the oxygen supplies Nasal cannula  Clean it with a warm, wet cloth daily or as needed.  Wash it with a liquid soap once a week.  Rinse it thoroughly once or twice a week.  Replace it every 2-4 weeks.  If you have an infection, such as a cold or pneumonia, change the cannula when you get better. Mask  Replace it every 2-4 weeks.  If you have an infection, such as a cold or pneumonia, change the mask when you get better. Humidifier bottle  Wash the bottle between each refill: ? Wash it with soap and warm water. ? Rinse it thoroughly. ? Disinfect it and its top. ? Air-dry it.  Make sure it is dry before you refill it. Oxygen concentrator  Clean   the air filter at least twice a week according to directions from your home medical equipment and service company.  Wipe down the cabinet every day. To do this: ? Unplug the unit. ? Wipe down the cabinet with a damp cloth. ? Dry the cabinet. Other equipment  Change any extra tubing every 1-3 months.  Follow instructions from your health care provider about taking care of any other equipment. Safety tips Fire safety tips   Keep your oxygen and oxygen supplies at least 5 ft away from sources of heat, flames, and sparks at all times.  Do not allow smoking near your oxygen. Put up "no smoking" signs in  your home. Avoid smoking areas when in public.  Do not use materials that can burn (are flammable) while you use oxygen.  When you go to a restaurant with portable oxygen, ask to be seated in the nonsmoking section.  Keep a fire extinguisher close by. Let your fire department know that you have oxygen in your home.  Test your home smoke detectors regularly. Traveling  Secure your oxygen tank in the vehicle so that it does not move around. Follow instructions from your medical device company about how to safely secure your tank.  Make sure you have enough oxygen for the amount of time you will be away from home.  If you are planning air travel, contact the airline to find out if they allow the use of an approved portable oxygen concentrator. You may also need documents from your health care provider and medical device company before you travel. General safety tips  If you use an oxygen cylinder, make sure it is in a stand or secured to an object that will not move (fixed object).  If you use liquid oxygen, make sure its container is kept upright.  If you use an oxygen concentrator: ? Tell your electric company. Make sure you are given priority service in the event that your power goes out. ? Avoid using extension cords, if possible. Follow these instructions at home:  Use oxygen only as told by your health care provider.  Do not use alcohol or other drugs that make you relax (sedating drugs) unless instructed. They can slow down your breathing rate and make it hard to get in enough oxygen.  Know how and when to order a refill of oxygen.  Always keep a spare tank of oxygen. Plan ahead for holidays when you may not be able to get a prescription filled.  Use water-based lubricants on your lips or nostrils. Do not use oil-based products like petroleum jelly.  To prevent skin irritation on your cheeks or behind your ears, tuck some gauze under the tubing. Contact a health care  provider if:  You get headaches often.  You have shortness of breath.  You have a lasting cough.  You have anxiety.  You are sleepy all the time.  You develop an illness that affects your breathing.  You cannot exercise at your regular level.  You are restless.  You have difficult or irregular breathing, and it is getting worse.  You have a fever.  You have persistent redness under your nose. Get help right away if:  You are confused.  You have blue lips or fingernails.  You are struggling to breathe. Summary  Your health care provider or a representative from your medical device company will show you how to use your oxygen device. Follow her or his instructions.  If you use an oxygen concentrator, make   sure it is plugged in.  Make sure the liter-flow setting on the machine is at the level prescribed by your health care provider.  Keep your oxygen and oxygen supplies at least 5 ft away from sources of heat, flames, and sparks at all times. This information is not intended to replace advice given to you by your health care provider. Make sure you discuss any questions you have with your health care provider. Document Revised: 04/24/2018 Document Reviewed: 05/29/2016 Elsevier Patient Education  2020 Elsevier Inc.  

## 2020-09-19 NOTE — Progress Notes (Signed)
[facility] Patient [facility] [address] [place], [place]  [address] Phone:  [phone]   Fax:  [phone] Virtual Visit via Telephone Note  I connected with Bryson Dames on 09/19/20 at  9:20 AM EDT by telephone and verified that I am speaking with the correct person using two identifiers.   I discussed the limitations, risks, security and privacy concerns of performing an evaluation and management service by telephone and the availability of in person appointments. I also discussed with the patient that there may be a patient responsible charge related to this service. The patient expressed understanding and agreed to proceed.  Patient home Provider Office  History of Present Illness: Establishing care He  has a past medical history of Arthritis, Diabetes mellitus without complication (HCC), Exertional chest pain (01/23/2019), Exertional dyspnea (01/23/2019), and Hypertension.   Upper Respiratory Infection Patient complains of symptoms of a URI. 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 Symptoms include shortness of breath. Onset of symptoms was several days ago, and has been gradually worsening since that time. Treatment to date: antibiotics and inhaler and oxygen.    Diabetes Mellitus Patient presents for follow up of diabetes. Current symptoms include: hyperglycemia. Symptoms have gradually worsened. Patient denies foot ulcerations, hypoglycemia , increased appetite, nausea, polydipsia, polyuria and vomiting. Evaluation to date has included: fasting blood sugar.  Home sugars: BGs are high in the morning, BGs are high in the evening, BGs are high around dinner, BGs are high at bedtime. Current treatment: Continued insulin which has been somewhat effective.   He has arthritis and pain in his shoulder.   Observations/Objective: No exam due to virtual visit home O2 Sat 94% RA  Assessment and Plan: Assessment  Primary Diagnosis & Pertinent Problem List: The  primary encounter diagnosis was Pneumonia due to COVID-19 virus. Diagnoses of Essential hypertension, Uncontrolled type 2 diabetes mellitus with hyperglycemia (HCC), and Acute shoulder pain, unspecified laterality were also pertinent to this visit.  Visit Diagnosis: 1. Pneumonia due to COVID-19 virus  Keep oxygen to keep sats >92% otherwise do not use.   2. Essential hypertension  Encouraged on going compliance with current medication regimen Encouraged home monitoring and recording BP <130/80 Eating a heart-healthy diet with less salt Encouraged regular physical activity  Recommend Weight loss   3. Uncontrolled type 2 diabetes mellitus with hyperglycemia (HCC)  Encourage compliance with current treatment regimen  Dose adjustment metformin 500mg  BID increased Lantus 15 units QHS FU one month Encourage regular CBG monitoring Encourage contacting office if excessive hyperglycemia and or hypoglycemia Lifestyle modification with healthy diet (fewer calories, more high fiber foods, whole grains and non-starchy vegetables, lower fat meat and fish, low-fat diary include healthy oils) regular exercise (physical activity) and weight loss Opthalmology exam discussed  Nutritional consult recommended Regular dental visits encouraged Home BP monitoring also encouraged goal <130/80    4. Acute shoulder pain, unspecified laterality  Discuss at up coming visit    Follow Up Instructions:    I discussed the assessment and treatment plan with the patient. The patient was provided an opportunity to ask questions and all were answered. The patient agreed with the plan and demonstrated an understanding of the instructions.   The patient was advised to call back or seek an in-person evaluation if the symptoms worsen or if the condition fails to improve as anticipated.  I provided 20 minutes of non-face-to-face time during this encounter.   , NP

## 2020-10-17 ENCOUNTER — Encounter: Payer: Self-pay | Admitting: Pulmonary Disease

## 2020-10-17 ENCOUNTER — Encounter: Payer: Self-pay | Admitting: Nurse Practitioner

## 2020-10-17 ENCOUNTER — Ambulatory Visit (INDEPENDENT_AMBULATORY_CARE_PROVIDER_SITE_OTHER): Payer: Medicaid Other | Admitting: Pulmonary Disease

## 2020-10-17 ENCOUNTER — Ambulatory Visit (INDEPENDENT_AMBULATORY_CARE_PROVIDER_SITE_OTHER): Payer: Medicaid Other | Admitting: Nurse Practitioner

## 2020-10-17 ENCOUNTER — Other Ambulatory Visit: Payer: Self-pay

## 2020-10-17 VITALS — BP 147/87 | HR 87 | Temp 97.5°F | Resp 18 | Ht 76.5 in | Wt 266.6 lb

## 2020-10-17 VITALS — BP 142/82 | HR 85 | Temp 97.2°F | Ht 76.5 in | Wt 271.4 lb

## 2020-10-17 DIAGNOSIS — E1165 Type 2 diabetes mellitus with hyperglycemia: Secondary | ICD-10-CM

## 2020-10-17 DIAGNOSIS — J9601 Acute respiratory failure with hypoxia: Secondary | ICD-10-CM | POA: Diagnosis not present

## 2020-10-17 DIAGNOSIS — U071 COVID-19: Secondary | ICD-10-CM

## 2020-10-17 DIAGNOSIS — J1282 Pneumonia due to coronavirus disease 2019: Secondary | ICD-10-CM

## 2020-10-17 DIAGNOSIS — I10 Essential (primary) hypertension: Secondary | ICD-10-CM | POA: Diagnosis not present

## 2020-10-17 LAB — POCT GLYCOSYLATED HEMOGLOBIN (HGB A1C): Hemoglobin A1C: 9.7 % — AB (ref 4.0–5.6)

## 2020-10-17 LAB — GLUCOSE, POCT (MANUAL RESULT ENTRY): POC Glucose: 243 mg/dl — AB (ref 70–99)

## 2020-10-17 MED ORDER — FREESTYLE LIBRE 2 SENSOR MISC: 1.0000 "application " | 11 refills | Status: AC

## 2020-10-17 MED ORDER — GABAPENTIN 300 MG PO CAPS: 300.0000 mg | ORAL_CAPSULE | Freq: Two times a day (BID) | ORAL | 3 refills | Status: DC

## 2020-10-17 MED ORDER — ALBUTEROL SULFATE HFA 108 (90 BASE) MCG/ACT IN AERS: 2.0000 | INHALATION_SPRAY | Freq: Four times a day (QID) | RESPIRATORY_TRACT | 2 refills | Status: DC | PRN

## 2020-10-17 MED ORDER — AMLODIPINE BESYLATE 10 MG PO TABS: 10.0000 mg | ORAL_TABLET | Freq: Every day | ORAL | 3 refills | Status: DC

## 2020-10-17 MED ORDER — LISINOPRIL 20 MG PO TABS: 20.0000 mg | ORAL_TABLET | Freq: Every day | ORAL | 3 refills | Status: DC

## 2020-10-17 MED ORDER — LEVEMIR FLEXTOUCH 100 UNIT/ML ~~LOC~~ SOPN: 30.0000 [IU] | PEN_INJECTOR | Freq: Two times a day (BID) | SUBCUTANEOUS | 3 refills | Status: DC

## 2020-10-17 MED ORDER — FREESTYLE LIBRE 2 READER DEVI: 1.0000 "application " | 11 refills | Status: DC

## 2020-10-17 NOTE — Progress Notes (Signed)
[facility] Patient [facility] [address] [place], [place]  [address] Phone:  [phone]   Fax:  [phone]   Established Patient Office Visit  Subjective:  Patient ID: Jeffery Houston, male    DOB: February 03, 1971  Age: 49 y.o. MRN: [id number]  CC: No chief complaint on file.   HPI Jeffery Houston presents for follow up.  has a past medical history of Arthritis, Diabetes mellitus without complication (HCC), Exertional chest pain (01/23/2019), Exertional dyspnea (01/23/2019), and Hypertension.   Diabetes Mellitus Patient presents for follow up of diabetes. Current symptoms include: hyperglycemia. Symptoms have stabilized. Patient denies foot ulcerations, hypoglycemia , increased appetite, nausea, polydipsia, polyuria, vomiting and weight loss. Evaluation to date has included: fasting blood sugar, fasting lipid panel and hemoglobin A1C.  Home sugars: patient does not check sugars. Current treatment: Continued insulin which has been not very effective, has not been on this  metformin which has been unable to assess effectiveness and Continued ACE inhibitor/ARB which has been somewhat effective.zetia unable to test effectiveness.   Hypertension Patient is here for follow-up of elevated blood pressure. He is not exercising and is adherent to a low-salt diet. Blood pressure is well controlled at home. Cardiac symptoms: none. Patient denies chest pain, dyspnea, exertional chest pressure/discomfort, irregular heart beat, lower extremity edema, near-syncope, palpitations and syncope. Cardiovascular risk factors: diabetes mellitus, dyslipidemia, hypertension, male gender, obesity (BMI >= 30 kg/m2) and sedentary lifestyle. Use of agents associated with hypertension: NSAIDS. History of target organ damage: none. Past Medical History:  Diagnosis Date   Arthritis    bil knees   Diabetes mellitus without complication (HCC)    Exertional chest pain 01/23/2019   Exertional dyspnea 01/23/2019   Hypertension      Past Surgical History:  Procedure Laterality Date   FOOT SURGERY     PARTIAL KNEE ARTHROPLASTY Left 07/22/2015   Procedure: LEF PARTIAL KNEE REPLACEMENT ;  Surgeon: Sheral Apley, MD;  Location: [facility];  Service: Orthopedics;  Laterality: Left;   PARTIAL KNEE ARTHROPLASTY Right 10/05/2016   Procedure: UNICOMPARTMENTAL KNEE;  Surgeon: Sheral Apley, MD;  Location:  SURGERY CENTER;  Service: Orthopedics;  Laterality: Right;  Pre/Post Op femoral nerve block   WRIST SURGERY Right    tendon repair    Family History  Problem Relation Age of Onset   Heart attack Brother     Social History   Socioeconomic History   Marital status: Married    Spouse name: Not on file   Number of children: 0   Years of education: Not on file   Highest education level: Not on file  Occupational History   Not on file  Tobacco Use   Smoking status: Former Smoker    Packs/day: 2.00    Types: Cigarettes    Quit date: 05/14/2010    Years since quitting: 10.4   Smokeless tobacco: Former Forensic psychologist Use: Never used  Substance and Sexual Activity   Alcohol use: Yes    Comment: social   Drug use: No   Sexual activity: Yes  Other Topics Concern   Not on file  Social History Narrative   Not on file   Social Determinants of Health   Financial Resource Strain:    Difficulty of Paying Living Expenses: Not on file  Food Insecurity:    Worried About Running Out of Food in the Last Year: Not on file   [employer] of Food  in the Last Year: Not on file  Transportation Needs:    Lack of Transportation (Medical): Not on file   Lack of Transportation (Non-Medical): Not on file  Physical Activity:    Days of Exercise per Week: Not on file   Minutes of Exercise per Session: Not on file  Stress:    Feeling of Stress : Not on file  Social Connections:    Frequency of Communication with Friends and Family: Not on file   Frequency of  Social Gatherings with Friends and Family: Not on file   Attends Religious Services: Not on file   Active Member of Clubs or Organizations: Not on file   Attends Banker Meetings: Not on file   Marital Status: Not on file  Intimate Partner Violence:    Fear of Current or Ex-Partner: Not on file   Emotionally Abused: Not on file   Physically Abused: Not on file   Sexually Abused: Not on file    Outpatient Medications Prior to Visit  Medication Sig Dispense Refill   aspirin EC 81 MG tablet Take 81 mg by mouth daily.     esomeprazole (NEXIUM) 40 MG capsule Take 40 mg by mouth daily as needed (acid reflux).   0   ezetimibe (ZETIA) 10 MG tablet Take 1 tablet (10 mg total) by mouth daily. 90 tablet 3   meloxicam (MOBIC) 15 MG tablet Take 15 mg by mouth daily as needed (shoulder pain).      Menthol, Topical Analgesic, (ICY HOT EX) Apply 1 application topically at bedtime.     metFORMIN (GLUCOPHAGE) 500 MG tablet Take 1 tablet (500 mg total) by mouth 2 (two) times daily with a meal. 180 tablet 3   acetaminophen (TYLENOL) 500 MG tablet Take 1,000 mg by mouth daily as needed (shoulder pain).     ibuprofen (ADVIL,MOTRIN) 600 MG tablet Take 1 tablet (600 mg total) by mouth every 6 (six) hours as needed for moderate pain. 30 tablet 0   albuterol (PROAIR HFA) 108 (90 Base) MCG/ACT inhaler Inhale 2 puffs into the lungs every 4 (four) hours as needed for wheezing or shortness of breath.     amLODipine (NORVASC) 10 MG tablet Take 10 mg by mouth daily.  4   gabapentin (NEURONTIN) 300 MG capsule Take 300 mg by mouth 2 (two) times daily.   0   Insulin Detemir (LEVEMIR FLEXTOUCH) 100 UNIT/ML Pen Inject 15 Units into the skin at bedtime.      lisinopril (PRINIVIL,ZESTRIL) 20 MG tablet Take 20 mg by mouth daily.  2   methocarbamol (ROBAXIN) 500 MG tablet Take 1 tablet (500 mg total) by mouth every 6 (six) hours as needed for muscle spasms. 40 tablet 0   ondansetron (ZOFRAN) 4  MG tablet Take 1 tablet (4 mg total) by mouth every 8 (eight) hours as needed for nausea or vomiting. 40 tablet 0   No facility-administered medications prior to visit.    Allergies  Allergen Reactions   Latex Hives    ROS Review of Systems  Constitutional: Negative.   Eyes: Negative.   Respiratory: Negative.   Cardiovascular: Negative.   Gastrointestinal: Positive for constipation (occasional). Negative for diarrhea, nausea and vomiting.  Endocrine: Negative.   Genitourinary: Negative.   Musculoskeletal: Negative.   Skin: Negative.   Allergic/Immunologic: Negative.   Neurological: Negative.   Hematological: Negative.       Objective:    Physical Exam Constitutional:      General: He is not in  acute distress.    Appearance: He is obese. He is not ill-appearing, toxic-appearing or diaphoretic.  HENT:     Head: Normocephalic and atraumatic.     Nose: Nose normal.  Eyes:     Comments: Wear glasses   Cardiovascular:     Rate and Rhythm: Normal rate and regular rhythm.     Pulses: Normal pulses.     Heart sounds: Normal heart sounds.  Pulmonary:     Effort: Pulmonary effort is normal.     Breath sounds: Normal breath sounds.  Abdominal:     General: Bowel sounds are normal.     Palpations: Abdomen is soft.  Musculoskeletal:        General: Normal range of motion.     Cervical back: Normal range of motion.  Skin:    General: Skin is warm and dry.     Capillary Refill: Capillary refill takes less than 2 seconds.  Neurological:     General: No focal deficit present.     Mental Status: He is alert and oriented to person, place, and time.  Psychiatric:        Mood and Affect: Mood normal.        Behavior: Behavior normal.        Thought Content: Thought content normal.        Judgment: Judgment normal.     BP (!) 147/87 (BP Location: Right Arm, Patient Position: Sitting, Cuff Size: Large)    Pulse 87    Temp (!) 97.5 F (36.4 C)    Resp 18    Ht 6' 4.5"  (1.943 m)    Wt 266 lb 9.6 oz (120.9 kg)    SpO2 97%    BMI 32.03 kg/m  Wt Readings from Last 3 Encounters:  10/17/20 266 lb 9.6 oz (120.9 kg)  10/17/20 271 lb 6.4 oz (123.1 kg)  09/08/20 268 lb 0.1 oz (121.6 kg)     There are no preventive care reminders to display for this patient.  There are no preventive care reminders to display for this patient.  No results found for: TSH Lab Results  Component Value Date   WBC 11.5 (H) 08/30/2020   HGB 14.3 08/30/2020   HCT 43.3 08/30/2020   MCV 94.7 08/30/2020   PLT 515 (H) 08/30/2020   Lab Results  Component Value Date   NA 136 08/30/2020   K 3.6 08/30/2020   CO2 27 08/30/2020   GLUCOSE 250 (H) 08/30/2020   BUN 10 08/30/2020   CREATININE 0.90 08/30/2020   BILITOT 1.1 08/30/2020   ALKPHOS 61 08/30/2020   AST 33 08/30/2020   ALT 48 (H) 08/30/2020   PROT 7.7 08/30/2020   ALBUMIN 3.0 (L) 08/30/2020   CALCIUM 8.5 (L) 08/30/2020   ANIONGAP 12 08/30/2020   No results found for: CHOL No results found for: HDL No results found for: [facility] Lab Results  Component Value Date   TRIG 124 08/30/2020   No results found for: CHOLHDL Lab Results  Component Value Date   HGBA1C 9.7 (A) 10/17/2020      Assessment & Plan:   Problem List Items Addressed This Visit      Cardiovascular and Mediastinum   Essential hypertension Encouraged on going compliance with current medication regimen Encouraged home monitoring and recording BP <130/80 Eating a heart-healthy diet with less salt Encouraged regular physical activity  Recommend Weight loss   Relevant Medications   lisinopril (ZESTRIL) 20 MG tablet   amLODipine (NORVASC) 10 MG  tablet   Other Relevant Orders   Comp. Metabolic Panel (12)     Respiratory   Pneumonia due to COVID-19 virus - Primary   Relevant Medications   albuterol (VENTOLIN HFA) 108 (90 Base) MCG/ACT inhaler   Other Relevant Orders   CBC with Differential/Platelet    Other Visit Diagnoses    Uncontrolled  type 2 diabetes mellitus with hyperglycemia (HCC)     Encourage compliance with current treatment regimen  Dose adjustment increased Levemir 30 units twice daily continue with Metformin 500 mg twice daily Encourage regular CBG monitoring Encourage contacting office if excessive hyperglycemia and or hypoglycemia Lifestyle modification with healthy diet (fewer calories, more high fiber foods, whole grains and non-starchy vegetables, lower fat meat and fish, low-fat diary include healthy oils) regular exercise (physical activity) and weight loss Opthalmology exam already scheduled Podiatry appointment to be made possible referral Home BP monitoring also encouraged goal <130/80     Relevant Medications   lisinopril (ZESTRIL) 20 MG tablet   insulin detemir (LEVEMIR FLEXTOUCH) 100 UNIT/ML FlexPen   Other Relevant Orders   Microalbumin, urine   POCT glucose (manual entry) (Completed)   POCT glycosylated hemoglobin (Hb A1C) (Completed)      Meds ordered this encounter  Medications   albuterol (VENTOLIN HFA) 108 (90 Base) MCG/ACT inhaler    Sig: Inhale 2 puffs into the lungs every 6 (six) hours as needed for wheezing or shortness of breath.    Dispense:  8 g    Refill:  2    Order Specific Question:   Supervising Provider    Answer:   Quentin Angst [[id number]]   gabapentin (NEURONTIN) 300 MG capsule    Sig: Take 1 capsule (300 mg total) by mouth 2 (two) times daily.    Dispense:  180 capsule    Refill:  3    Order Specific Question:   Supervising Provider    Answer:   Quentin Angst [[id number]]   lisinopril (ZESTRIL) 20 MG tablet    Sig: Take 1 tablet (20 mg total) by mouth daily.    Dispense:  90 tablet    Refill:  3    Order Specific Question:   Supervising Provider    Answer:   Quentin Angst [[id number]]   insulin detemir (LEVEMIR FLEXTOUCH) 100 UNIT/ML FlexPen    Sig: Inject 30 Units into the skin 2 (two) times daily.    Dispense:  54 mL    Refill:  3    Order  Specific Question:   Supervising Provider    Answer:   Quentin Angst [[id number]]   amLODipine (NORVASC) 10 MG tablet    Sig: Take 1 tablet (10 mg total) by mouth daily.    Dispense:  90 tablet    Refill:  3    Order Specific Question:   Supervising Provider    Answer:   Quentin Angst [[id number]]   Continuous Blood Gluc Receiver (FREESTYLE LIBRE 2 READER) DEVI    Sig: 1 application by Does not apply route every 14 (fourteen) days.    Dispense:  2 each    Refill:  11    Order Specific Question:   Supervising Provider    Answer:   Quentin Angst [[id number]]   Continuous Blood Gluc Sensor (FREESTYLE LIBRE 2 SENSOR) MISC    Sig: 1 application by Does not apply route every 14 (fourteen) days.    Dispense:  2 each    Refill:  11  Order Specific Question:   Supervising Provider    Answer:   Quentin AngstJEGEDE, OLUGBEMIGA E [[id number]]    Follow-up: Return in about 3 months (around 01/16/2021).    Barbette Merinorystal M Ezel Vallone, NP

## 2020-10-17 NOTE — Progress Notes (Signed)
@Patient  ID: , male    DOB: Jul 28, 1971, 49 y.o.   MRN: [id number]  Chief Complaint  Patient presents with  . Consult    Covid 08/25/20     Referring provider: 10/25/20, NP  HPI:   49 year old man who was seen in consultation at the request of 57, NP for evaluation of hypoxic respiratory failure and COVID-19 pneumonia.  Notes from referring provider reviewed.  Patient diagnosed with COVID-19 pneumonia 08/25/2020.  Chest x-ray at time of my interpretation showed predominantly right sided linear interstitial infiltrates.  5 days later he had monoclonal antibody infusion.  Chest x-ray at that time showed worsening bilateral infiltrates.  Subsequent chest x-ray 10/10 11/2019 showed worsening peripheral bilateral infiltrates on my interpretation.  He needed oxygen and was on around 3 L at home.  This persisted for a couple weeks.  Gradually, his dyspnea on exertion improved.  So this is hypoxemia.  He has been weaned off oxygen at this point.  He checks his oxygen at home and with after exertion it stays above 92% consistently.  Denies dropping any lower.  At this time he denies any respiratory symptoms.  No cough.  No dyspnea on exertion.  He is back at work as a 12/2019.    PMH: Hypertension, diabetes, hyperlipidemia Surgical history: Reviewed, he denies any  family history: Reviewed, he denies any respiratory or lung issues Social history: Quit smoking 2011 after a bad bout of flu, works as a 2012, grew up in [place],   [employer] / Pulmonary Flowsheets:   ACT:  No flowsheet data found.  MMRC: No flowsheet data found.  Epworth:  No flowsheet data found.  Tests:   FENO:  No results found for: NITRICOXIDE  PFT: No flowsheet data found.  WALK:  No flowsheet data found.  Imaging: Personally reviewed and as per EMR and discussion above  Lab Results: Personally reviewed CBC    Component Value Date/Time   WBC 11.5 (H) 08/30/2020 1630    RBC 4.57 08/30/2020 1630   HGB 14.3 08/30/2020 1630   HCT 43.3 08/30/2020 1630   PLT 515 (H) 08/30/2020 1630   MCV 94.7 08/30/2020 1630   MCH 31.3 08/30/2020 1630   MCHC 33.0 08/30/2020 1630   RDW 11.7 08/30/2020 1630   LYMPHSABS 2.3 08/30/2020 1630   MONOABS 0.7 08/30/2020 1630   EOSABS 0.0 08/30/2020 1630   BASOSABS 0.0 08/30/2020 1630    BMET    Component Value Date/Time   NA 136 08/30/2020 1630   K 3.6 08/30/2020 1630   CL 97 (L) 08/30/2020 1630   CO2 27 08/30/2020 1630   GLUCOSE 250 (H) 08/30/2020 1630   BUN 10 08/30/2020 1630   CREATININE 0.90 08/30/2020 1630   CALCIUM 8.5 (L) 08/30/2020 1630   GFRNONAA >60 08/30/2020 1630   GFRAA >60 09/28/2016 1300    BNP No results found for: BNP  ProBNP No results found for: PROBNP  Specialty Problems      Pulmonary Problems   Shortness of breath   Pneumonia due to COVID-19 virus   Acute respiratory failure with hypoxia (HCC)      Allergies  Allergen Reactions  . Latex Hives    Immunization History  Administered Date(s) Administered  . Tdap 04/11/2013, 05/26/2019    Past Medical History:  Diagnosis Date  . Arthritis    bil knees  . Diabetes mellitus without complication (HCC)   . Exertional chest pain 01/23/2019  . Exertional dyspnea 01/23/2019  .  Hypertension     Tobacco History: Social History   Tobacco Use  Smoking Status Former Smoker  . Packs/day: 2.00  . Types: Cigarettes  . Quit date: 05/14/2010  . Years since quitting: 10.4  Smokeless Tobacco Former Financial planner given: Not Answered   Continue to not smoke  Outpatient Encounter Medications as of 10/17/2020  Medication Sig  . acetaminophen (TYLENOL) 500 MG tablet Take 1,000 mg by mouth daily as needed (shoulder pain).  Marland Kitchen albuterol (PROAIR HFA) 108 (90 Base) MCG/ACT inhaler Inhale 2 puffs into the lungs every 4 (four) hours as needed for wheezing or shortness of breath.  Marland Kitchen amLODipine (NORVASC) 10 MG tablet Take 10 mg by mouth daily.   Marland Kitchen aspirin EC 81 MG tablet Take 81 mg by mouth daily.  Marland Kitchen esomeprazole (NEXIUM) 40 MG capsule Take 40 mg by mouth daily as needed (acid reflux).   . gabapentin (NEURONTIN) 300 MG capsule Take 300 mg by mouth 2 (two) times daily.   Marland Kitchen ibuprofen (ADVIL,MOTRIN) 600 MG tablet Take 1 tablet (600 mg total) by mouth every 6 (six) hours as needed for moderate pain.  . Insulin Detemir (LEVEMIR FLEXTOUCH) 100 UNIT/ML Pen Inject 15 Units into the skin at bedtime.   Marland Kitchen lisinopril (PRINIVIL,ZESTRIL) 20 MG tablet Take 20 mg by mouth daily.  . meloxicam (MOBIC) 15 MG tablet Take 15 mg by mouth daily as needed (shoulder pain).   . Menthol, Topical Analgesic, (ICY HOT EX) Apply 1 application topically at bedtime.  . metFORMIN (GLUCOPHAGE) 500 MG tablet Take 1 tablet (500 mg total) by mouth 2 (two) times daily with a meal.  . methocarbamol (ROBAXIN) 500 MG tablet Take 1 tablet (500 mg total) by mouth every 6 (six) hours as needed for muscle spasms.  . ondansetron (ZOFRAN) 4 MG tablet Take 1 tablet (4 mg total) by mouth every 8 (eight) hours as needed for nausea or vomiting.  . predniSONE (DELTASONE) 10 MG tablet Take 4 tabs for 2 days, then 3 tabs for 2 days, then 2 tabs for 2 days, then 1 tab for 2 days, then stop  . ezetimibe (ZETIA) 10 MG tablet Take 1 tablet (10 mg total) by mouth daily. (Patient not taking: Reported on 10/17/2020)  . oxymetazoline (AFRIN NASAL SPRAY) 0.05 % nasal spray Place 1 spray into both nostrils 2 (two) times daily.   No facility-administered encounter medications on file as of 10/17/2020.     Review of Systems  Review of Systems  No chest pain exertion.  No orthopnea or PND.  Comprehensive review of systems otherwise negative. Physical Exam  BP (!) 142/82 (BP Location: Left Arm, Cuff Size: Normal)   Pulse 85   Temp (!) 97.2 F (36.2 C)   Ht 6' 4.5" (1.943 m)   Wt 271 lb 6.4 oz (123.1 kg)   SpO2 98%   BMI 32.61 kg/m   Wt Readings from Last 5 Encounters:  10/17/20 271 lb  6.4 oz (123.1 kg)  09/08/20 268 lb 0.1 oz (121.6 kg)  01/23/19 273 lb 6.4 oz (124 kg)  10/05/16 270 lb 2 oz (122.5 kg)  07/22/15 287 lb 4 oz (130.3 kg)    BMI Readings from Last 5 Encounters:  10/17/20 32.61 kg/m  09/08/20 36.35 kg/m  01/23/19 37.08 kg/m  10/05/16 32.88 kg/m  07/22/15 34.97 kg/m     Physical Exam General: Well-appearing, no acute distress Eyes: EOMI, no icterus Neck: Supple, no JVP appreciated Respiratory:Distant breath sounds on the right, clear to auscultation  bilaterally Perivascular: Regular rhythm, no murmurs Abdomen: Nondistended, bowel hospital MSK: No synovitis, joint effusion Neuro: Normal gait, no weakness Psych: Normal mood, flat affect   Assessment & Plan:   Acute hypoxemic restaurant failure: On oxygen at home with COVID-19 diagnosis 08/25/2020.  This slowly improved over time and is not been wearing over the last few weeks.  Notes oxygen saturation stayed above 90% without oxygen.  This is resolved as would be expected with resolution of acute pneumonia as below.  COVID-19 pneumonia: Serial chest x-rays reviewed as above with gradual worsening of peripheral infiltrates on 09/08/2020.  Recommend repeat chest x-ray via PCP about 6 3 to 6 months after initial infection to establish new baseline chest x-ray.   Return if symptoms worsen or fail to improve.   Karren Burly, MD 10/17/2020

## 2020-10-17 NOTE — Patient Instructions (Signed)
Nice to meet you  Glad you have recovered well  Your PCP may want to do a CXR in April, about 6 months after the covid diagnosis.   Return to clinic as needed

## 2020-10-18 LAB — CBC WITH DIFFERENTIAL/PLATELET
Basophils Absolute: 0 10*3/uL (ref 0.0–0.2)
Basos: 0 %
EOS (ABSOLUTE): 0.1 10*3/uL (ref 0.0–0.4)
Eos: 2 %
Hematocrit: 40.4 % (ref 37.5–51.0)
Hemoglobin: 13.8 g/dL (ref 13.0–17.7)
Immature Grans (Abs): 0 10*3/uL (ref 0.0–0.1)
Immature Granulocytes: 0 %
Lymphocytes Absolute: 2.6 10*3/uL (ref 0.7–3.1)
Lymphs: 34 %
MCH: 31.7 pg (ref 26.6–33.0)
MCHC: 34.2 g/dL (ref 31.5–35.7)
MCV: 93 fL (ref 79–97)
Monocytes Absolute: 0.6 10*3/uL (ref 0.1–0.9)
Monocytes: 7 %
Neutrophils Absolute: 4.5 10*3/uL (ref 1.4–7.0)
Neutrophils: 57 %
Platelets: 395 10*3/uL (ref 150–450)
RBC: 4.36 x10E6/uL (ref 4.14–5.80)
RDW: 11.5 % — ABNORMAL LOW (ref 11.6–15.4)
WBC: 7.9 10*3/uL (ref 3.4–10.8)

## 2020-10-18 LAB — COMP. METABOLIC PANEL (12)
AST: 18 IU/L (ref 0–40)
Albumin/Globulin Ratio: 1.5 (ref 1.2–2.2)
Albumin: 4.3 g/dL (ref 4.0–5.0)
Alkaline Phosphatase: 102 IU/L (ref 44–121)
BUN/Creatinine Ratio: 10 (ref 9–20)
BUN: 9 mg/dL (ref 6–24)
Bilirubin Total: 1.2 mg/dL (ref 0.0–1.2)
Calcium: 9.7 mg/dL (ref 8.7–10.2)
Chloride: 101 mmol/L (ref 96–106)
Creatinine, Ser: 0.94 mg/dL (ref 0.76–1.27)
GFR calc Af Amer: 111 mL/min/{1.73_m2} (ref >59)
GFR calc non Af Amer: 96 mL/min/{1.73_m2} (ref >59)
Globulin, Total: 2.9 g/dL (ref 1.5–4.5)
Glucose: 220 mg/dL — ABNORMAL HIGH (ref 65–99)
Potassium: 4.2 mmol/L (ref 3.5–5.2)
Sodium: 139 mmol/L (ref 134–144)
Total Protein: 7.2 g/dL (ref 6.0–8.5)

## 2020-10-18 LAB — MICROALBUMIN, URINE: Microalbumin, Urine: 21.3 ug/mL

## 2020-12-01 ENCOUNTER — Other Ambulatory Visit: Payer: Medicaid Other

## 2020-12-01 DIAGNOSIS — Z20822 Contact with and (suspected) exposure to covid-19: Secondary | ICD-10-CM

## 2020-12-03 LAB — SARS-COV-2, NAA 2 DAY TAT

## 2020-12-03 LAB — NOVEL CORONAVIRUS, NAA: SARS-CoV-2, NAA: NOT DETECTED

## 2021-01-16 ENCOUNTER — Ambulatory Visit: Payer: Medicaid Other | Admitting: Nurse Practitioner

## 2021-07-13 ENCOUNTER — Emergency Department (HOSPITAL_COMMUNITY): Payer: Medicaid Other

## 2021-07-13 ENCOUNTER — Emergency Department (HOSPITAL_COMMUNITY)
Admission: EM | Admit: 2021-07-13 | Discharge: 2021-07-13 | Disposition: A | Discharge status: Home / Self Care | Payer: Medicaid Other | Attending: Emergency Medicine | Admitting: Emergency Medicine

## 2021-07-13 DIAGNOSIS — I10 Essential (primary) hypertension: Secondary | ICD-10-CM | POA: Insufficient documentation

## 2021-07-13 DIAGNOSIS — L03115 Cellulitis of right lower limb: Secondary | ICD-10-CM | POA: Diagnosis not present

## 2021-07-13 DIAGNOSIS — E11628 Type 2 diabetes mellitus with other skin complications: Secondary | ICD-10-CM | POA: Diagnosis not present

## 2021-07-13 DIAGNOSIS — Z7984 Long term (current) use of oral hypoglycemic drugs: Secondary | ICD-10-CM | POA: Insufficient documentation

## 2021-07-13 DIAGNOSIS — Z8616 Personal history of COVID-19: Secondary | ICD-10-CM | POA: Diagnosis not present

## 2021-07-13 DIAGNOSIS — Z87891 Personal history of nicotine dependence: Secondary | ICD-10-CM | POA: Insufficient documentation

## 2021-07-13 DIAGNOSIS — Z79899 Other long term (current) drug therapy: Secondary | ICD-10-CM | POA: Insufficient documentation

## 2021-07-13 DIAGNOSIS — Z96653 Presence of artificial knee joint, bilateral: Secondary | ICD-10-CM | POA: Insufficient documentation

## 2021-07-13 DIAGNOSIS — Z794 Long term (current) use of insulin: Secondary | ICD-10-CM | POA: Insufficient documentation

## 2021-07-13 DIAGNOSIS — L02415 Cutaneous abscess of right lower limb: Secondary | ICD-10-CM | POA: Diagnosis present

## 2021-07-13 DIAGNOSIS — Z91040 Latex allergy status: Secondary | ICD-10-CM | POA: Insufficient documentation

## 2021-07-13 DIAGNOSIS — W57XXXA Bitten or stung by nonvenomous insect and other nonvenomous arthropods, initial encounter: Secondary | ICD-10-CM | POA: Diagnosis not present

## 2021-07-13 DIAGNOSIS — Z7982 Long term (current) use of aspirin: Secondary | ICD-10-CM | POA: Insufficient documentation

## 2021-07-13 LAB — COMPREHENSIVE METABOLIC PANEL
ALT: 27 U/L (ref 0–44)
AST: 20 U/L (ref 15–41)
Albumin: 3.8 g/dL (ref 3.5–5.0)
Alkaline Phosphatase: 69 U/L (ref 38–126)
Anion gap: 11 (ref 5–15)
BUN: 8 mg/dL (ref 6–20)
CO2: 22 mmol/L (ref 22–32)
Calcium: 8.9 mg/dL (ref 8.9–10.3)
Chloride: 104 mmol/L (ref 98–111)
Creatinine, Ser: 0.99 mg/dL (ref 0.61–1.24)
GFR, Estimated: >60 mL/min (ref >60)
Glucose, Bld: 225 mg/dL — ABNORMAL HIGH (ref 70–99)
Potassium: 3.5 mmol/L (ref 3.5–5.1)
Sodium: 137 mmol/L (ref 135–145)
Total Bilirubin: 1.2 mg/dL (ref 0.3–1.2)
Total Protein: 7 g/dL (ref 6.5–8.1)

## 2021-07-13 LAB — CBC WITH DIFFERENTIAL/PLATELET
Abs Immature Granulocytes: 0.03 10*3/uL (ref 0.00–0.07)
Basophils Absolute: 0 10*3/uL (ref 0.0–0.1)
Basophils Relative: 0 %
Eosinophils Absolute: 0.1 10*3/uL (ref 0.0–0.5)
Eosinophils Relative: 1 %
HCT: 42.7 % (ref 39.0–52.0)
Hemoglobin: 14 g/dL (ref 13.0–17.0)
Immature Granulocytes: 0 %
Lymphocytes Relative: 26 %
Lymphs Abs: 2.6 10*3/uL (ref 0.7–4.0)
MCH: 32.3 pg (ref 26.0–34.0)
MCHC: 32.8 g/dL (ref 30.0–36.0)
MCV: 98.6 fL (ref 80.0–100.0)
Monocytes Absolute: 0.7 10*3/uL (ref 0.1–1.0)
Monocytes Relative: 7 %
Neutro Abs: 6.6 10*3/uL (ref 1.7–7.7)
Neutrophils Relative %: 66 %
Platelets: 364 10*3/uL (ref 150–400)
RBC: 4.33 MIL/uL (ref 4.22–5.81)
RDW: 11.9 % (ref 11.5–15.5)
WBC: 10.1 10*3/uL (ref 4.0–10.5)
nRBC: 0 % (ref 0.0–0.2)

## 2021-07-13 MED ORDER — DOXYCYCLINE HYCLATE 100 MG PO CAPS: 100.0000 mg | ORAL_CAPSULE | Freq: Two times a day (BID) | ORAL | 0 refills | Status: DC

## 2021-07-13 MED ORDER — VANCOMYCIN HCL 2000 MG/400ML IV SOLN
2000.0000 mg | Freq: Once | INTRAVENOUS | Status: DC
  Filled 2021-07-13: qty 400

## 2021-07-13 MED ORDER — HYDROMORPHONE HCL 1 MG/ML IJ SOLN
1.0000 mg | Freq: Once | INTRAMUSCULAR | Status: AC
Start: 2021-07-13 — End: 2021-07-13
  Administered 2021-07-13: 1 mg via INTRAVENOUS
  Filled 2021-07-13: qty 1

## 2021-07-13 NOTE — ED Provider Notes (Signed)
[facility] EMERGENCY DEPARTMENT Provider Note   CSN: [id number] Arrival date & time: 07/13/21  2107     History Chief Complaint  Patient presents with   Insect Bite    Jeffery Houston is a 50 y.o. male.  The history is provided by the patient.  Abscess Location:  Leg Leg abscess location:  R lower leg Size:  3 cm in diameter Abscess quality: draining, induration, painful, redness and warmth   Red streaking: yes   Duration:  4 days Progression:  Worsening Pain details:    Quality:  Throbbing   Severity:  Moderate   Duration:  4 days   Timing:  Constant   Progression:  Worsening Chronicity:  New Context: diabetes and insect bite/sting (did not see insect)   Relieved by:  Nothing Worsened by:  Draining/squeezing Ineffective treatments:  None tried Associated symptoms: no fever, no nausea and no vomiting       Past Medical History:  Diagnosis Date   Arthritis    bil knees   Diabetes mellitus without complication (HCC)    Exertional chest pain 01/23/2019   Exertional dyspnea 01/23/2019   Hypertension     Patient Active Problem List   Diagnosis Date Noted   Pneumonia due to COVID-19 virus 09/01/2020   Shortness of breath 01/23/2019   Laboratory examination 01/23/2019   Exertional chest pain 01/23/2019   Primary osteoarthritis of right knee 09/24/2016   Essential hypertension 09/24/2016   S/P left unicompartmental knee replacement 07/22/2015    Past Surgical History:  Procedure Laterality Date   FOOT SURGERY     PARTIAL KNEE ARTHROPLASTY Left 07/22/2015   Procedure: LEF PARTIAL KNEE REPLACEMENT ;  Surgeon: Sheral Apley, MD;  Location: [facility];  Service: Orthopedics;  Laterality: Left;   PARTIAL KNEE ARTHROPLASTY Right 10/05/2016   Procedure: UNICOMPARTMENTAL KNEE;  Surgeon: Sheral Apley, MD;  Location:  SURGERY CENTER;  Service: Orthopedics;  Laterality: Right;  Pre/Post Op femoral nerve block   WRIST SURGERY  Right    tendon repair       Family History  Problem Relation Age of Onset   Heart attack Brother     Social History   Tobacco Use   Smoking status: Former    Packs/day: 2.00    Types: Cigarettes    Quit date: 05/14/2010    Years since quitting: 11.1   Smokeless tobacco: Former  Building services engineer Use: Never used  Substance Use Topics   Alcohol use: Yes    Comment: social   Drug use: No    Home Medications Prior to Admission medications   Medication Sig Start Date End Date Taking? Authorizing Provider  acetaminophen (TYLENOL) 500 MG tablet Take 1,000 mg by mouth daily as needed (shoulder pain).    [provider]  albuterol (VENTOLIN HFA) 108 (90 Base) MCG/ACT inhaler Inhale 2 puffs into the lungs every 6 (six) hours as needed for wheezing or shortness of breath. 10/17/20   Barbette Merino, NP  amLODipine (NORVASC) 10 MG tablet Take 1 tablet (10 mg total) by mouth daily. 10/17/20 10/17/21  Barbette Merino, NP  aspirin EC 81 MG tablet Take 81 mg by mouth daily.    [provider]  Continuous Blood Gluc Receiver (FREESTYLE LIBRE 2 READER) DEVI 1 application by Does not apply route every 14 (fourteen) days. 10/17/20   Barbette Merino, NP  Continuous Blood Gluc Sensor (FREESTYLE LIBRE 2 SENSOR) MISC 1  application by Does not apply route every 14 (fourteen) days. 10/17/20 10/17/21  Barbette Merino, NP  esomeprazole (NEXIUM) 40 MG capsule Take 40 mg by mouth daily as needed (acid reflux).  09/09/18   [provider]  ezetimibe (ZETIA) 10 MG tablet Take 1 tablet (10 mg total) by mouth daily. 09/19/20 09/19/21  Barbette Merino, NP  gabapentin (NEURONTIN) 300 MG capsule Take 1 capsule (300 mg total) by mouth 2 (two) times daily. 10/17/20 10/17/21  Barbette Merino, NP  ibuprofen (ADVIL,MOTRIN) 600 MG tablet Take 1 tablet (600 mg total) by mouth every 6 (six) hours as needed for moderate pain. 04/10/16   Charm Rings, MD  insulin detemir (LEVEMIR FLEXTOUCH) 100  UNIT/ML FlexPen Inject 30 Units into the skin 2 (two) times daily. 10/17/20 10/17/21  Barbette Merino, NP  lisinopril (ZESTRIL) 20 MG tablet Take 1 tablet (20 mg total) by mouth daily. 10/17/20 10/17/21  Barbette Merino, NP  meloxicam (MOBIC) 15 MG tablet Take 15 mg by mouth daily as needed (shoulder pain).  08/07/18   [provider]  Menthol, Topical Analgesic, (ICY HOT EX) Apply 1 application topically at bedtime.    [provider]  metFORMIN (GLUCOPHAGE) 500 MG tablet Take 1 tablet (500 mg total) by mouth 2 (two) times daily with a meal. 09/19/20 09/19/21  Barbette Merino, NP    Allergies    Latex  Review of Systems   Review of Systems  Constitutional:  Negative for chills and fever.  HENT:  Negative for ear pain and sore throat.   Eyes:  Negative for pain and visual disturbance.  Respiratory:  Negative for cough and shortness of breath.   Cardiovascular:  Negative for chest pain and palpitations.  Gastrointestinal:  Negative for abdominal pain, nausea and vomiting.  Genitourinary:  Negative for dysuria and hematuria.  Musculoskeletal:  Negative for arthralgias and back pain.       Developed right lateral hip pain today   Skin:  Positive for wound. Negative for color change and rash.  Neurological:  Negative for seizures and syncope.  All other systems reviewed and are negative.  Physical Exam Updated Vital Signs BP 138/84   Pulse 77   Temp 98 F (36.7 C)   Resp 16   SpO2 96%   Physical Exam Vitals and nursing note reviewed.  Constitutional:      Appearance: Normal appearance.  HENT:     Head: Normocephalic and atraumatic.  Eyes:     Conjunctiva/sclera: Conjunctivae normal.  Pulmonary:     Effort: Pulmonary effort is normal. No respiratory distress.  Musculoskeletal:     Cervical back: Normal range of motion.     Comments: There is a large open wound at the anteromedial aspect of the distal tibial surface of the right leg.  Measures approximately 3  cm in diameter.  There is scant purulent discharge from the wound and some surrounding erythema.  Distal pulses are full.  Sensation is full.  Compartments are soft.  Right hip range of motion is full and painless  Skin:    General: Skin is warm and dry.  Neurological:     General: No focal deficit present.     Mental Status: He is alert and oriented to person, place, and time. Mental status is at baseline.  Psychiatric:        Mood and Affect: Mood normal.     ED Results / Procedures / Treatments   Labs (all labs ordered  are listed, but only abnormal results are displayed) Labs Reviewed  CULTURE, BLOOD (ROUTINE X 2)  CULTURE, BLOOD (ROUTINE X 2)  CBC WITH DIFFERENTIAL/PLATELET  COMPREHENSIVE METABOLIC PANEL    EKG None  Radiology DG Tibia/Fibula Right  Result Date: 07/13/2021 CLINICAL DATA:  Spider bite wound with progressive worsening. Redness, swelling and warmth to touch. Assess for osteomyelitis. EXAM: RIGHT TIBIA AND FIBULA - 2 VIEW COMPARISON:  Knee radiograph 01/21/2017 FINDINGS: Medial compartment hemiarthroplasty. Heterotopic calcification adjacent to the distal medial femur is more defined than on prior exam. No acute fracture. No erosion, periosteal reaction, or bone destruction. Mild chronic syndesmotic calcifications distally. Mild soft tissue edema of the lower extremity, more prominent distally. No soft tissue air or radiopaque foreign body. IMPRESSION: 1. Soft tissue edema of the lower extremity. No radiographic findings of osteomyelitis. No soft tissue air or radiopaque foreign body. 2. Medial compartment hemiarthroplasty of the knee. Heterotopic ossification adjacent to the distal medial femur is more defined than on 2018 knee radiographs. Electronically Signed   By: Narda Rutherford M.D.   On: 07/13/2021 22:14   DG Hip Unilat With Pelvis 2-3 Views Right  Result Date: 07/13/2021 CLINICAL DATA:  Right hip pain, no known injury EXAM: DG HIP (WITH OR WITHOUT PELVIS)  2-3V RIGHT COMPARISON:  None. FINDINGS: There is no evidence of hip fracture or dislocation. There is no evidence of arthropathy or other focal bone abnormality. IMPRESSION: Negative. Electronically Signed   By: Charlett Nose M.D.   On: 07/13/2021 22:13    Procedures Procedures   Medications Ordered in ED Medications  vancomycin (VANCOCIN) IVPB 1000 mg/200 mL premix (has no administration in time range)  HYDROmorphone (DILAUDID) injection 1 mg (has no administration in time range)    ED Course  I have reviewed the triage vital signs and the nursing notes.  Pertinent labs & imaging results that were available during my care of the patient were reviewed by me and considered in my medical decision making (see chart for details).    MDM Rules/Calculators/A&P                           Jeffery Houston has a history of diabetes.  He presents with a skin wound that may or may not have been from an insect.  It appears that he has a draining abscess and some surrounding cellulitis.  He was given vancomycin.  He will be discharged home with doxycycline.  I told him that he is at risk for complications based on the location of the wound, his diabetes, and his tobacco use.  He understands that he will need close follow-up and may need to return for further treatment if symptoms worsen despite treatment. Final Clinical Impression(s) / ED Diagnoses Final diagnoses:  Cellulitis of right lower extremity  Type 2 diabetes mellitus with other skin complication, with long-term current use of insulin (HCC)    Rx / DC Orders ED Discharge Orders          Ordered    doxycycline (VIBRAMYCIN) 100 MG capsule  2 times daily        07/13/21 2238             Koleen Distance, MD 07/13/21 2242

## 2021-07-13 NOTE — ED Triage Notes (Signed)
Pt bib [facility] c/o spider bite wound progressively getting worse. Per ems, pt got bit on Monday. Wound appears red, swollen and warm to touch. A&O x 4.   -Vitals with EMS:  -BP140/92 -HR 92  -RR16  -98% RA -CBG 243

## 2021-07-17 ENCOUNTER — Other Ambulatory Visit: Payer: Self-pay

## 2021-07-17 ENCOUNTER — Encounter: Payer: Self-pay | Admitting: Nurse Practitioner

## 2021-07-17 ENCOUNTER — Ambulatory Visit (INDEPENDENT_AMBULATORY_CARE_PROVIDER_SITE_OTHER): Payer: Medicaid Other | Admitting: Nurse Practitioner

## 2021-07-17 VITALS — BP 148/88 | HR 77 | Temp 97.9°F | Ht 76.5 in | Wt 254.0 lb

## 2021-07-17 DIAGNOSIS — L03115 Cellulitis of right lower limb: Secondary | ICD-10-CM | POA: Diagnosis not present

## 2021-07-17 DIAGNOSIS — E1165 Type 2 diabetes mellitus with hyperglycemia: Secondary | ICD-10-CM | POA: Diagnosis not present

## 2021-07-17 LAB — POCT GLYCOSYLATED HEMOGLOBIN (HGB A1C)
HbA1c POC (<> result, manual entry): 7.3 % (ref 4.0–5.6)
HbA1c, POC (controlled diabetic range): 7.3 % — AB (ref 0.0–7.0)
HbA1c, POC (prediabetic range): 7.3 % — AB (ref 5.7–6.4)
Hemoglobin A1C: 7.3 % — AB (ref 4.0–5.6)

## 2021-07-17 MED ORDER — CEPHALEXIN 500 MG PO CAPS: 500.0000 mg | ORAL_CAPSULE | Freq: Four times a day (QID) | ORAL | 0 refills | Status: AC

## 2021-07-17 NOTE — Patient Instructions (Signed)
You were seen in the [facility] today for your cellulitis. You were prescribed keflex, please take this in addition to your prescribed doxycycline. Please go to the ED if your symptoms worsen or progress as discussed during your visit.

## 2021-07-17 NOTE — Progress Notes (Signed)
[facility] [address] [place], [place]  [address] Phone:  [phone]   Fax:  [phone] Subjective:   Patient ID: Jeffery [place], male    DOB: 1971-10-10, 50 y.o.   MRN: [id number]  Chief Complaint  Patient presents with   Insect Bite    ED 8/25 was given doxycyline. Area is getting worse redness, warm to touch, draining   HPI [name] 50 y.o. male presents with   Abscess: Patient presents for evaluation of a cutaneous abscess. Lesion is located in the right lower extremity, shin area . Onset was 1 week ago. Symptoms have worsened, beginning 1 day ago. Abscess has associated symptoms of spontaneous drainage. Patient does not have previous history of cutaneous abscesses. Patient does have diabetes. Was evaluated in ED 4 days ago and discharged with prescription for doxycycline, which he has been compliant.  Past Medical History:  Diagnosis Date   Arthritis    bil knees   Diabetes mellitus without complication (HCC)    Exertional chest pain 01/23/2019   Exertional dyspnea 01/23/2019   Hypertension     Past Surgical History:  Procedure Laterality Date   FOOT SURGERY     PARTIAL KNEE ARTHROPLASTY Left 07/22/2015   Procedure: LEF PARTIAL KNEE REPLACEMENT ;  Surgeon: Sheral Apley, MD;  Location: [facility];  Service: Orthopedics;  Laterality: Left;   PARTIAL KNEE ARTHROPLASTY Right 10/05/2016   Procedure: UNICOMPARTMENTAL KNEE;  Surgeon: Sheral Apley, MD;  Location: [facility];  Service: Orthopedics;  Laterality: Right;  Pre/Post Op femoral nerve block   WRIST SURGERY Right    tendon repair    Family History  Problem Relation Age of Onset   Heart attack Brother     Social History   Socioeconomic History   Marital status: Married    Spouse name: Not on file   Number of children: 0   Years of education: Not on file   Highest education level: Not on file  Occupational History   Not on file  Tobacco Use    Smoking status: Former    Packs/day: 2.00    Types: Cigarettes    Quit date: 05/14/2010    Years since quitting: 11.1   Smokeless tobacco: Former  Building services engineer Use: Never used  Substance and Sexual Activity   Alcohol use: Yes    Comment: social   Drug use: No   Sexual activity: Yes  Other Topics Concern   Not on file  Social History Narrative   Not on file   Social Determinants of Health   Financial Resource Strain: Not on file  Food Insecurity: Not on file  Transportation Needs: Not on file  Physical Activity: Not on file  Stress: Not on file  Social Connections: Not on file  Intimate Partner Violence: Not on file    Outpatient Medications Prior to Visit  Medication Sig Dispense Refill   acetaminophen (TYLENOL) 500 MG tablet Take 1,000 mg by mouth daily as needed (shoulder pain).     albuterol (VENTOLIN HFA) 108 (90 Base) MCG/ACT inhaler Inhale 2 puffs into the lungs every 6 (six) hours as needed for wheezing or shortness of breath. 8 g 2   amLODipine (NORVASC) 10 MG tablet Take 1 tablet (10 mg total) by mouth daily. 90 tablet 3   aspirin EC 81 MG tablet Take 81 mg by mouth daily.     Continuous Blood Gluc Receiver (FREESTYLE [place]  2 READER) DEVI 1 application by Does not apply route every 14 (fourteen) days. 2 each 11   Continuous Blood Gluc Sensor (FREESTYLE LIBRE 2 SENSOR) MISC 1 application by Does not apply route every 14 (fourteen) days. 2 each 11   doxycycline (VIBRAMYCIN) 100 MG capsule Take 1 capsule (100 mg total) by mouth 2 (two) times daily. 20 capsule 0   esomeprazole (NEXIUM) 40 MG capsule Take 40 mg by mouth daily as needed (acid reflux).   0   ezetimibe (ZETIA) 10 MG tablet Take 1 tablet (10 mg total) by mouth daily. 90 tablet 3   gabapentin (NEURONTIN) 300 MG capsule Take 1 capsule (300 mg total) by mouth 2 (two) times daily. 180 capsule 3   insulin detemir (LEVEMIR FLEXTOUCH) 100 UNIT/ML FlexPen Inject 30 Units into the skin 2 (two) times daily. 54  mL 3   lisinopril (ZESTRIL) 20 MG tablet Take 1 tablet (20 mg total) by mouth daily. 90 tablet 3   meloxicam (MOBIC) 15 MG tablet Take 15 mg by mouth daily as needed (shoulder pain).      Menthol, Topical Analgesic, (ICY HOT EX) Apply 1 application topically at bedtime.     metFORMIN (GLUCOPHAGE) 500 MG tablet Take 1 tablet (500 mg total) by mouth 2 (two) times daily with a meal. 180 tablet 3   ibuprofen (ADVIL,MOTRIN) 600 MG tablet Take 1 tablet (600 mg total) by mouth every 6 (six) hours as needed for moderate pain. 30 tablet 0   No facility-administered medications prior to visit.    Allergies  Allergen Reactions   Latex Hives    Review of Systems  Constitutional:  Negative for chills, fever and malaise/fatigue.  Respiratory:  Negative for cough and shortness of breath.   Cardiovascular:  Negative for chest pain, palpitations and leg swelling.  Gastrointestinal:  Negative for abdominal pain, blood in stool, constipation, diarrhea, nausea and vomiting.  Skin:        Redness and wound with clear to brown drainage. See HPI  Psychiatric/Behavioral:  Negative for depression. The patient is not nervous/anxious.   All other systems reviewed and are negative.     Objective:    Physical Exam Vitals reviewed.  Constitutional:      General: He is not in acute distress.    Appearance: Normal appearance.  HENT:     Head: Normocephalic.  Cardiovascular:     Rate and Rhythm: Normal rate and regular rhythm.     Pulses: Normal pulses.     Heart sounds: Normal heart sounds.     Comments: No obvious peripheral edema Pulmonary:     Effort: Pulmonary effort is normal.     Breath sounds: Normal breath sounds.  Skin:    General: Skin is warm and dry.     Capillary Refill: Capillary refill takes less than 2 seconds.     Findings: Abscess, erythema and wound present.     Comments: Please see media below  Neurological:     Mental Status: He is alert.  Psychiatric:        Mood and Affect:  Mood normal.        Behavior: Behavior normal.        Thought Content: Thought content normal.        Judgment: Judgment normal.       BP (!) 148/88   Pulse 77   Temp 97.9 F (36.6 C)   Ht 6' 4.5" (1.943 m)   Wt 254 lb 0.2 oz (115.2 kg)  SpO2 98%   BMI 30.52 kg/m  Wt Readings from Last 3 Encounters:  07/17/21 254 lb 0.2 oz (115.2 kg)  10/17/20 266 lb 9.6 oz (120.9 kg)  10/17/20 271 lb 6.4 oz (123.1 kg)    Immunization History  Administered Date(s) Administered   Tdap 04/11/2013, 05/26/2019    Diabetic Foot Exam - Simple   No data filed     No results found for: TSH Lab Results  Component Value Date   WBC 10.1 07/13/2021   HGB 14.0 07/13/2021   HCT 42.7 07/13/2021   MCV 98.6 07/13/2021   PLT 364 07/13/2021   Lab Results  Component Value Date   NA 137 07/13/2021   K 3.5 07/13/2021   CO2 22 07/13/2021   GLUCOSE 225 (H) 07/13/2021   BUN 8 07/13/2021   CREATININE 0.99 07/13/2021   BILITOT 1.2 07/13/2021   ALKPHOS 69 07/13/2021   AST 20 07/13/2021   ALT 27 07/13/2021   PROT 7.0 07/13/2021   ALBUMIN 3.8 07/13/2021   CALCIUM 8.9 07/13/2021   ANIONGAP 11 07/13/2021   No results found for: CHOL No results found for: HDL No results found for: [facility] Lab Results  Component Value Date   TRIG 124 08/30/2020   No results found for: CHOLHDL Lab Results  Component Value Date   HGBA1C 7.3 (A) 07/17/2021   HGBA1C 7.3 07/17/2021   HGBA1C 7.3 (A) 07/17/2021   HGBA1C 7.3 (A) 07/17/2021       Assessment & Plan:   Problem List Items Addressed This Visit   None Visit Diagnoses     Uncontrolled type 2 diabetes mellitus with hyperglycemia (HCC)    -  Primary   Relevant Orders   HgB A1c (Completed)   Cellulitis of right lower extremity       Relevant Medications   cephALEXin (KEFLEX) 500 MG capsule   Other Relevant Orders   WOUND CULTURE Affected area circled to monitor progression of cellulitis  Patient informed to take Keflex with  doxycycline Informed that if symptoms worsen to see ED evaluation and management   Patient to follow up in 6 wks for reevaluation of chronic illness and labs    I have discontinued Rana R. Nasser's ibuprofen. I am also having him start on cephALEXin. Additionally, I am having him maintain his meloxicam, esomeprazole, aspirin EC, acetaminophen, (Menthol, Topical Analgesic, (ICY HOT EX)), ezetimibe, metFORMIN, albuterol, gabapentin, lisinopril, Levemir FlexTouch, amLODipine, FreeStyle Libre 2 Reader, [employer] 2 Sensor, and doxycycline.  Meds ordered this encounter  Medications   cephALEXin (KEFLEX) 500 MG capsule    Sig: Take 1 capsule (500 mg total) by mouth 4 (four) times daily for 7 days.    Dispense:  28 capsule    Refill:  0     Kathrynn Speed, NP

## 2021-07-18 LAB — CULTURE, BLOOD (ROUTINE X 2): Culture: NO GROWTH

## 2021-07-19 LAB — CULTURE, BLOOD (ROUTINE X 2): Culture: NO GROWTH

## 2021-07-22 LAB — WOUND CULTURE

## 2021-09-01 ENCOUNTER — Ambulatory Visit: Payer: Medicaid Other | Admitting: Nurse Practitioner

## 2021-10-02 IMAGING — DX DG CHEST 1V PORT
1 series · 1 of 1 positions shown · non-contrast
Comparison: 08/25/2020

CLINICAL DATA: Shortness of breath, B9JQU-52 positivity

EXAM:
PORTABLE CHEST 1 VIEW

[chest ap]
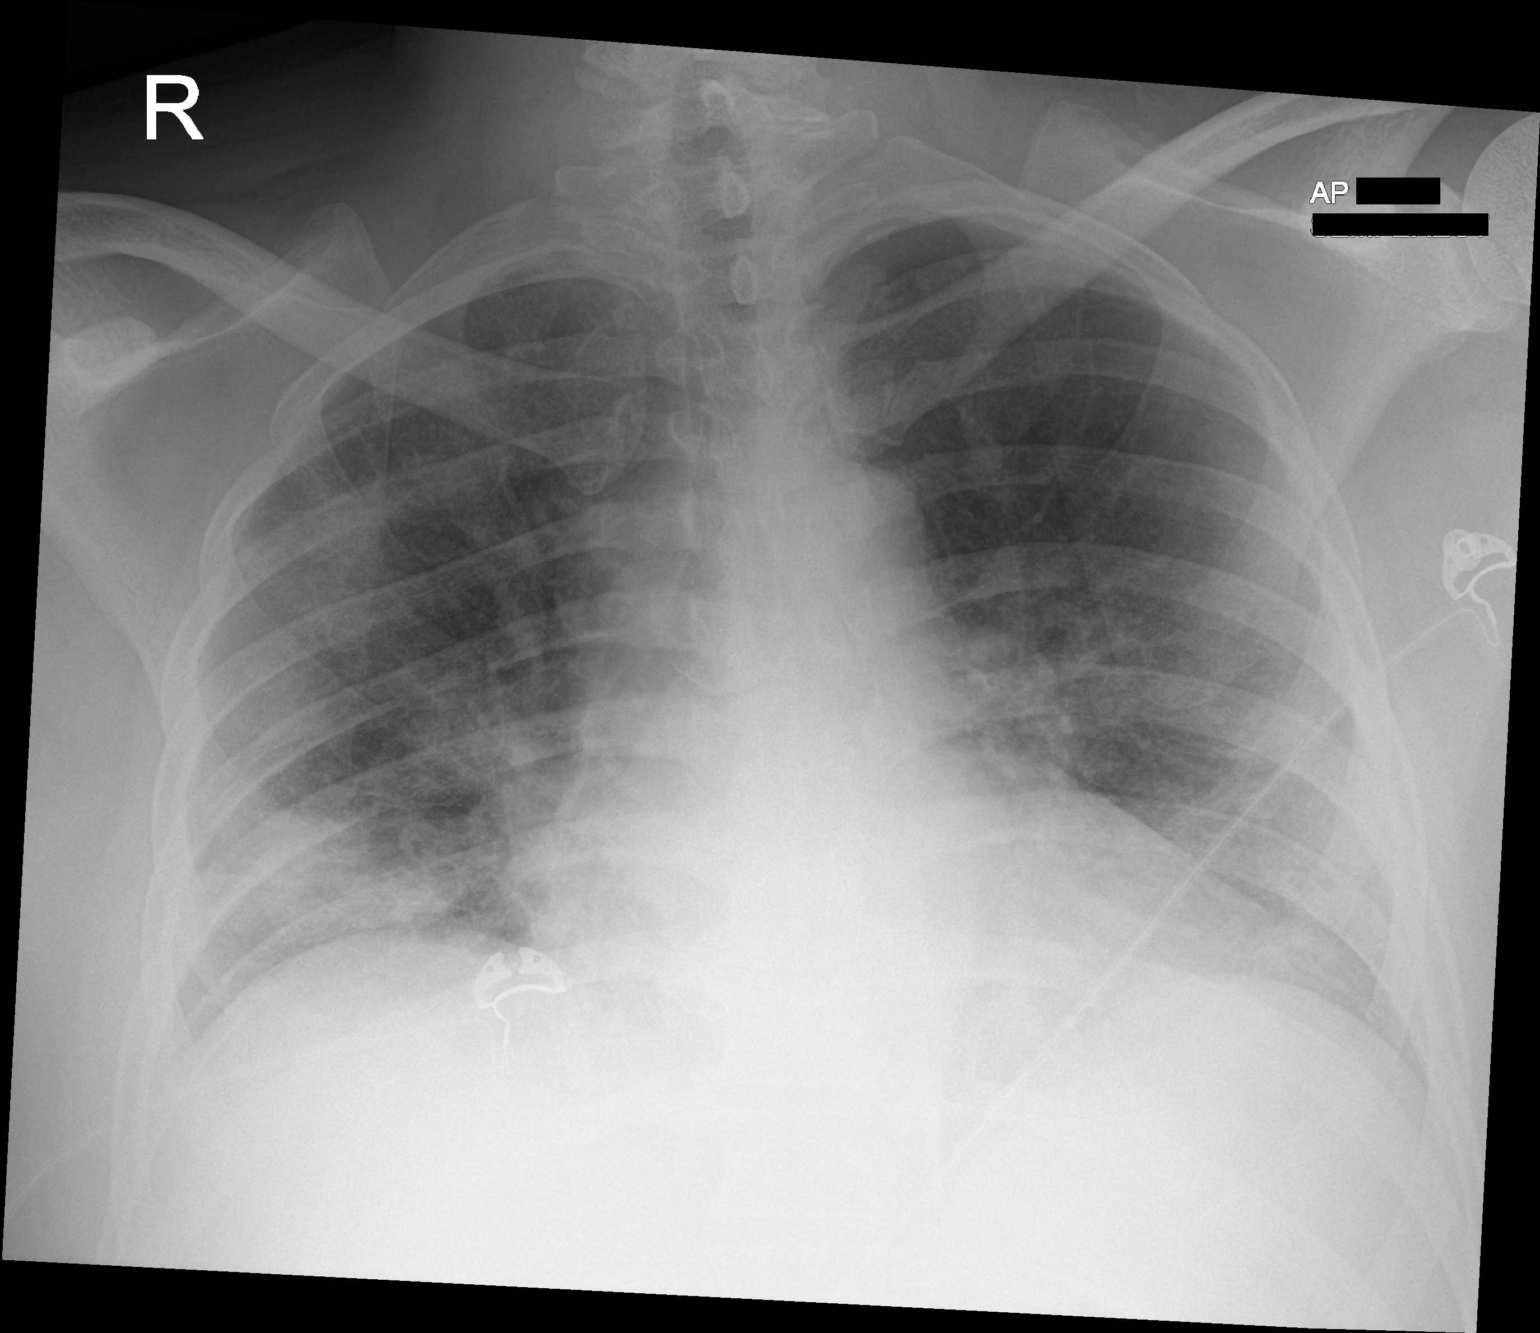

[1 of 1 positions shown; findings below may reference images not displayed]

FINDINGS: Cardiac shadow is stable. Lungs are well aerated. Slight increase in
the degree of bilateral airspace opacities is noted when compare
with the prior study consistent with the given clinical history of
B9JQU-52 positivity. No bony abnormality is seen.
IMPRESSION: Increase in the degree of airspace opacities bilaterally consistent
with given clinical history of B9JQU-52 positivity.

## 2021-11-21 ENCOUNTER — Emergency Department (HOSPITAL_COMMUNITY)
Admission: EM | Admit: 2021-11-21 | Discharge: 2021-11-21 | Disposition: A | Discharge status: Home / Self Care | Payer: Medicaid Other | Attending: Emergency Medicine | Admitting: Emergency Medicine

## 2021-11-21 ENCOUNTER — Other Ambulatory Visit: Payer: Self-pay

## 2021-11-21 ENCOUNTER — Encounter (HOSPITAL_COMMUNITY): Payer: Self-pay | Admitting: *Deleted

## 2021-11-21 ENCOUNTER — Emergency Department (HOSPITAL_COMMUNITY): Payer: Medicaid Other

## 2021-11-21 DIAGNOSIS — R0981 Nasal congestion: Secondary | ICD-10-CM | POA: Diagnosis not present

## 2021-11-21 DIAGNOSIS — R519 Headache, unspecified: Secondary | ICD-10-CM | POA: Diagnosis present

## 2021-11-21 DIAGNOSIS — R1084 Generalized abdominal pain: Secondary | ICD-10-CM | POA: Insufficient documentation

## 2021-11-21 DIAGNOSIS — Z20822 Contact with and (suspected) exposure to covid-19: Secondary | ICD-10-CM | POA: Insufficient documentation

## 2021-11-21 DIAGNOSIS — R059 Cough, unspecified: Secondary | ICD-10-CM | POA: Insufficient documentation

## 2021-11-21 LAB — COMPREHENSIVE METABOLIC PANEL
ALT: 25 U/L (ref 0–44)
AST: 19 U/L (ref 15–41)
Albumin: 3.7 g/dL (ref 3.5–5.0)
Alkaline Phosphatase: 89 U/L (ref 38–126)
Anion gap: 11 (ref 5–15)
BUN: 14 mg/dL (ref 6–20)
CO2: 23 mmol/L (ref 22–32)
Calcium: 9.1 mg/dL (ref 8.9–10.3)
Chloride: 102 mmol/L (ref 98–111)
Creatinine, Ser: 0.81 mg/dL (ref 0.61–1.24)
GFR, Estimated: >60 mL/min (ref >60)
Glucose, Bld: 308 mg/dL — ABNORMAL HIGH (ref 70–99)
Potassium: 3.8 mmol/L (ref 3.5–5.1)
Sodium: 136 mmol/L (ref 135–145)
Total Bilirubin: 1.2 mg/dL (ref 0.3–1.2)
Total Protein: 7.7 g/dL (ref 6.5–8.1)

## 2021-11-21 LAB — CBC WITH DIFFERENTIAL/PLATELET
Abs Immature Granulocytes: 0.05 10*3/uL (ref 0.00–0.07)
Basophils Absolute: 0.1 10*3/uL (ref 0.0–0.1)
Basophils Relative: 0 %
Eosinophils Absolute: 0.1 10*3/uL (ref 0.0–0.5)
Eosinophils Relative: 1 %
HCT: 41.7 % (ref 39.0–52.0)
Hemoglobin: 13.8 g/dL (ref 13.0–17.0)
Immature Granulocytes: 0 %
Lymphocytes Relative: 15 %
Lymphs Abs: 2 10*3/uL (ref 0.7–4.0)
MCH: 32.2 pg (ref 26.0–34.0)
MCHC: 33.1 g/dL (ref 30.0–36.0)
MCV: 97.2 fL (ref 80.0–100.0)
Monocytes Absolute: 0.9 10*3/uL (ref 0.1–1.0)
Monocytes Relative: 7 %
Neutro Abs: 9.8 10*3/uL — ABNORMAL HIGH (ref 1.7–7.7)
Neutrophils Relative %: 77 %
Platelets: 369 10*3/uL (ref 150–400)
RBC: 4.29 MIL/uL (ref 4.22–5.81)
RDW: 11.5 % (ref 11.5–15.5)
WBC: 12.7 10*3/uL — ABNORMAL HIGH (ref 4.0–10.5)
nRBC: 0 % (ref 0.0–0.2)

## 2021-11-21 LAB — LIPASE, BLOOD: Lipase: 27 U/L (ref 11–51)

## 2021-11-21 LAB — RESP PANEL BY RT-PCR (FLU A&B, COVID) ARPGX2
Influenza A by PCR: NEGATIVE
Influenza B by PCR: NEGATIVE
SARS Coronavirus 2 by RT PCR: NEGATIVE

## 2021-11-21 NOTE — ED Notes (Signed)
Pt called for vital recheck x2 with no answer

## 2021-11-21 NOTE — ED Triage Notes (Signed)
Dec 25 developed cold like symptoms, nasal congestion, cough headache, abd pain.

## 2021-11-21 NOTE — ED Provider Notes (Signed)
Emergency Medicine Provider Triage Evaluation Note  Jeffery [place] , a 51 y.o. male  was evaluated in triage.  Patient states he has had upper respiratory symptoms since Christmas.  He has had severe congestion and worsening headaches.  He says his symptoms did not improve at all and seem to be worsening.  He has a cough that is productive.  He feels like he is tightness is in his lungs now.  He also complains of generalized abdominal pain that seems to be worsening.  He denies any fevers or chills.  Denies nausea, vomiting, diarrhea.  Physical Exam  BP (!) 183/103 (BP Location: Left Arm)    Pulse 91    Temp 97.9 F (36.6 C) (Oral)    Resp 16    SpO2 98%  Gen:   Awake, no distress Resp:  Normal effort. Rhonchi in left lung fields MSK:   Moves extremities without difficulty  Other:  Abdomen soft, nontender  Medical Decision Making  Medically screening exam initiated at 9:55 AM.  Appropriate orders placed.  [name] was informed that the remainder of the evaluation will be completed by another provider, this initial triage assessment does not replace that evaluation, and the importance of remaining in the ED until their evaluation is complete.     [name], PA-C 11/21/21 [id number]    [name], MD 11/21/21 1453

## 2021-11-21 NOTE — ED Notes (Signed)
Called for room  No answer  

## 2021-12-25 ENCOUNTER — Encounter: Payer: Self-pay | Admitting: Nurse Practitioner

## 2021-12-25 ENCOUNTER — Other Ambulatory Visit: Payer: Self-pay

## 2021-12-25 ENCOUNTER — Ambulatory Visit: Payer: Medicaid Other | Admitting: Nurse Practitioner

## 2021-12-25 VITALS — BP 161/103 | HR 90 | Temp 98.2°F | Ht 76.5 in | Wt 265.4 lb

## 2021-12-25 DIAGNOSIS — K219 Gastro-esophageal reflux disease without esophagitis: Secondary | ICD-10-CM | POA: Diagnosis not present

## 2021-12-25 DIAGNOSIS — E1165 Type 2 diabetes mellitus with hyperglycemia: Secondary | ICD-10-CM | POA: Diagnosis not present

## 2021-12-25 DIAGNOSIS — I10 Essential (primary) hypertension: Secondary | ICD-10-CM | POA: Diagnosis not present

## 2021-12-25 DIAGNOSIS — Z114 Encounter for screening for human immunodeficiency virus [HIV]: Secondary | ICD-10-CM

## 2021-12-25 DIAGNOSIS — Z1159 Encounter for screening for other viral diseases: Secondary | ICD-10-CM

## 2021-12-25 LAB — POCT URINALYSIS DIP (CLINITEK)
Bilirubin, UA: NEGATIVE
Blood, UA: NEGATIVE
Glucose, UA: 500 mg/dL — AB
Ketones, POC UA: NEGATIVE mg/dL
Leukocytes, UA: NEGATIVE
Nitrite, UA: NEGATIVE
POC PROTEIN,UA: NEGATIVE
Spec Grav, UA: 1.025 (ref 1.010–1.025)
Urobilinogen, UA: 0.2 E.U./dL
pH, UA: 5.5 (ref 5.0–8.0)

## 2021-12-25 LAB — POCT GLYCOSYLATED HEMOGLOBIN (HGB A1C)
HbA1c POC (<> result, manual entry): 10 % (ref 4.0–5.6)
HbA1c, POC (controlled diabetic range): 10 % — AB (ref 0.0–7.0)
HbA1c, POC (prediabetic range): 10 % — AB (ref 5.7–6.4)
Hemoglobin A1C: 10 % — AB (ref 4.0–5.6)

## 2021-12-25 MED ORDER — BLOOD GLUCOSE MONITOR KIT: PACK | 0 refills | Status: AC

## 2021-12-25 MED ORDER — ESOMEPRAZOLE MAGNESIUM 40 MG PO CPDR: 40.0000 mg | DELAYED_RELEASE_CAPSULE | Freq: Every day | ORAL | 1 refills | Status: DC | PRN

## 2021-12-25 MED ORDER — ALBUTEROL SULFATE HFA 108 (90 BASE) MCG/ACT IN AERS: 2.0000 | INHALATION_SPRAY | Freq: Four times a day (QID) | RESPIRATORY_TRACT | 2 refills | Status: DC | PRN

## 2021-12-25 MED ORDER — GABAPENTIN 300 MG PO CAPS: 300.0000 mg | ORAL_CAPSULE | Freq: Two times a day (BID) | ORAL | 0 refills | Status: DC

## 2021-12-25 MED ORDER — AMOXICILLIN-POT CLAVULANATE 875-125 MG PO TABS: 1.0000 | ORAL_TABLET | Freq: Two times a day (BID) | ORAL | 0 refills | Status: DC

## 2021-12-25 MED ORDER — EZETIMIBE 10 MG PO TABS: 10.0000 mg | ORAL_TABLET | Freq: Every day | ORAL | 1 refills | Status: DC

## 2021-12-25 MED ORDER — METFORMIN HCL 500 MG PO TABS: 500.0000 mg | ORAL_TABLET | Freq: Two times a day (BID) | ORAL | 1 refills | Status: DC

## 2021-12-25 MED ORDER — LISINOPRIL 20 MG PO TABS: 20.0000 mg | ORAL_TABLET | Freq: Every day | ORAL | 1 refills | Status: DC

## 2021-12-25 MED ORDER — LEVEMIR FLEXTOUCH 100 UNIT/ML ~~LOC~~ SOPN: 30.0000 [IU] | PEN_INJECTOR | Freq: Two times a day (BID) | SUBCUTANEOUS | 1 refills | Status: DC

## 2021-12-25 MED ORDER — AMLODIPINE BESYLATE 10 MG PO TABS: 10.0000 mg | ORAL_TABLET | Freq: Every day | ORAL | 1 refills | Status: DC

## 2021-12-25 NOTE — Patient Instructions (Signed)

## 2021-12-25 NOTE — Progress Notes (Signed)
Jeffery [place], [place]  [address] Phone:  [phone]   Fax:  [phone]   Established Patient Office Visit  Subjective:  Patient ID: Jeffery [place], male    DOB: [birth year]  Age: 51 y.o. MRN: [id number]  CC:  Chief Complaint  Patient presents with   Nasal Congestion    Pt is here today for head congestion x 2 weeks. Pt states that his has been sick off and on since Christmas and his son was sick around the same time as well. Symptoms- runny nose, sinus pressure and facial pains.   Follow-up    Pt has not been seen in awhile and needs refills on all his medications and has not taking any medication in over a year.    HPI Jeffery Houston presents for follow up. He has been lost to follow up. He  has a past medical history of Arthritis, Diabetes mellitus without complication ([place]), Exertional chest pain (01/23/2019), Exertional dyspnea (01/23/2019), and Hypertension.   Jeffery [place] is in today for diabetes follow up. The prescribed treatment is Levemir 30 units BID and metformin 500 mg BID along with Zetia 10 mg. He has been out of his medication for almost one year.    His BP is elevated today due to noncompliance with is medication. Refills will be sent in today.    Upper Respiratory Infection Patient complains of possible sinusitis. Symptoms include congestion, facial pain, and nasal congestion. Onset of symptoms was 2 weeks ago, and has been unchanged since that time. Treatment to date:  APAP  and sinus pressure relief.  Past Medical History:  Diagnosis Date   Arthritis    bil knees   Diabetes mellitus without complication ([place])    Exertional chest pain 01/23/2019   Exertional dyspnea 01/23/2019   Hypertension     Past Surgical History:  Procedure Laterality Date   FOOT SURGERY     PARTIAL KNEE ARTHROPLASTY Left 07/22/2015   Procedure: LEF PARTIAL KNEE REPLACEMENT ;  Surgeon: Renette Butters, MD;  Location: [place];   Service: Orthopedics;  Laterality: Left;   PARTIAL KNEE ARTHROPLASTY Right 10/05/2016   Procedure: UNICOMPARTMENTAL KNEE;  Surgeon: Renette Butters, MD;  Location: [place];  Service: Orthopedics;  Laterality: Right;  Pre/Post Op femoral nerve block   WRIST SURGERY Right    tendon repair    Family History  Problem Relation Age of Onset   Heart attack Brother     Social History   Socioeconomic History   Marital status: Married    Spouse name: Not on file   Number of children: 0   Years of education: Not on file   Highest education level: Not on file  Occupational History   Not on file  Tobacco Use   Smoking status: Former    Packs/day: 2.00    Types: Cigarettes    Quit date: 05/14/2010    Years since quitting: 11.6   Smokeless tobacco: Former  Scientific laboratory technician Use: Never used  Substance and Sexual Activity   Alcohol use: Yes    Comment: social   Drug use: No   Sexual activity: Yes    Birth control/protection: None  Other Topics Concern   Not on file  Social History Narrative   Not on file   Social Determinants of Health   Financial Resource Strain: Not on file  Food Insecurity: Not on file  Transportation Needs:  Not on file  Physical Activity: Not on file  Stress: Not on file  Social Connections: Not on file  Intimate Partner Violence: Not on file    Outpatient Medications Prior to Visit  Medication Sig Dispense Refill   acetaminophen (TYLENOL) 500 MG tablet Take 1,000 mg by mouth daily as needed (shoulder pain).     aspirin EC 81 MG tablet Take 81 mg by mouth daily.     Continuous Blood Gluc Receiver (FREESTYLE [place] 2 READER) DEVI 1 application by Does not apply route every 14 (fourteen) days. 2 each 11   meloxicam (MOBIC) 15 MG tablet Take 15 mg by mouth daily as needed (shoulder pain).      Menthol, Topical Analgesic, (ICY HOT EX) Apply 1 application topically at bedtime.     albuterol (VENTOLIN HFA) 108 (90 Base) MCG/ACT inhaler  Inhale 2 puffs into the lungs every 6 (six) hours as needed for wheezing or shortness of breath. 8 g 2   amLODipine (NORVASC) 10 MG tablet Take 1 tablet (10 mg total) by mouth daily. 90 tablet 3   doxycycline (VIBRAMYCIN) 100 MG capsule Take 1 capsule (100 mg total) by mouth 2 (two) times daily. (Patient not taking: Reported on 12/25/2021) 20 capsule 0   esomeprazole (NEXIUM) 40 MG capsule Take 40 mg by mouth daily as needed (acid reflux).  (Patient not taking: Reported on 12/25/2021)  0   ezetimibe (ZETIA) 10 MG tablet Take 1 tablet (10 mg total) by mouth daily. 90 tablet 3   gabapentin (NEURONTIN) 300 MG capsule Take 1 capsule (300 mg total) by mouth 2 (two) times daily. 180 capsule 3   insulin detemir (LEVEMIR FLEXTOUCH) 100 UNIT/ML FlexPen Inject 30 Units into the skin 2 (two) times daily. 54 mL 3   lisinopril (ZESTRIL) 20 MG tablet Take 1 tablet (20 mg total) by mouth daily. 90 tablet 3   metFORMIN (GLUCOPHAGE) 500 MG tablet Take 1 tablet (500 mg total) by mouth 2 (two) times daily with a meal. 180 tablet 3   No facility-administered medications prior to visit.    Allergies  Allergen Reactions   Latex Hives    ROS Review of Systems  HENT:  Positive for postnasal drip.   Eyes:  Positive for visual disturbance (blurred and hazy).  Respiratory:  Negative for shortness of breath.   Cardiovascular:  Positive for chest pain (now and then).  Endocrine: Positive for polydipsia and polyuria.  Skin: Negative.   Neurological:  Positive for dizziness (now and then).     Objective:    Physical Exam Constitutional:      Appearance: He is obese.  HENT:     Head: Normocephalic and atraumatic.  Neurological:     Mental Status: He is alert.    BP (!) 161/103    Pulse 90    Temp 98.2 F (36.8 C)    Ht 6' 4.5" (1.943 m)    Wt 265 lb 6.4 oz (120.4 kg)    SpO2 98%    BMI 31.88 kg/m  Wt Readings from Last 3 Encounters:  12/25/21 265 lb 6.4 oz (120.4 kg)  11/21/21 263 lb (119.3 kg)  07/17/21  254 lb 0.2 oz (115.2 kg)     Health Maintenance Due  Topic Date Due   Hepatitis C Screening  Never done   URINE MICROALBUMIN  10/17/2021    There are no preventive care reminders to display for this patient.  No results found for: TSH Lab Results  Component Value Date  WBC 12.7 (H) 11/21/2021   HGB 13.8 11/21/2021   HCT 41.7 11/21/2021   MCV 97.2 11/21/2021   PLT 369 11/21/2021   Lab Results  Component Value Date   NA 136 11/21/2021   K 3.8 11/21/2021   CO2 23 11/21/2021   GLUCOSE 308 (H) 11/21/2021   BUN 14 11/21/2021   CREATININE 0.81 11/21/2021   BILITOT 1.2 11/21/2021   ALKPHOS 89 11/21/2021   AST 19 11/21/2021   ALT 25 11/21/2021   PROT 7.7 11/21/2021   ALBUMIN 3.7 11/21/2021   CALCIUM 9.1 11/21/2021   ANIONGAP 11 11/21/2021   No results found for: CHOL No results found for: HDL No results found for: [facility] Lab Results  Component Value Date   TRIG 124 08/30/2020   No results found for: CHOLHDL Lab Results  Component Value Date   HGBA1C 10.0 (A) 12/25/2021   HGBA1C 10.0 12/25/2021   HGBA1C 10.0 (A) 12/25/2021   HGBA1C 10.0 (A) 12/25/2021      Assessment & Plan:   Problem List Items Addressed This Visit       Cardiovascular and Mediastinum   Essential hypertension Uncontrolled due to non compliance Encouraged on going compliance with current medication regimen Encouraged home monitoring and recording BP <130/80 Eating a heart-healthy diet with less salt Encouraged regular physical activity  Recommend Weight loss     Relevant Medications   amLODipine (NORVASC) 10 MG tablet   ezetimibe (ZETIA) 10 MG tablet   lisinopril (ZESTRIL) 20 MG tablet   Other Relevant Orders   Comp. Metabolic Panel (12)   Other Visit Diagnoses     Uncontrolled type 2 diabetes mellitus with hyperglycemia ([place])    -  Primary Treatment restarted.  Encourage compliance with current treatment regimen  Dose adjustment restarted Encourage regular CBG  monitoring Encourage contacting office if excessive hyperglycemia and or hypoglycemia Lifestyle modification with healthy diet (fewer calories, more high fiber foods, whole grains and non-starchy vegetables, lower fat meat and fish, low-fat diary include healthy oils) regular exercise (physical activity) and weight loss Home BP monitoring also encouraged goal <130/80    Relevant Medications   insulin detemir (LEVEMIR FLEXTOUCH) 100 UNIT/ML FlexPen   lisinopril (ZESTRIL) 20 MG tablet   metFORMIN (GLUCOPHAGE) 500 MG tablet   Other Relevant Orders   HgB A1c (Completed)   POCT URINALYSIS DIP (CLINITEK) (Completed)   Comp. Metabolic Panel (12)   Lipid panel   Microalbumin/Creatinine Ratio, Urine   Gastroesophageal reflux disease without esophagitis     Persistent    Relevant Medications   esomeprazole (NEXIUM) 40 MG capsule   Screening for HIV (human immunodeficiency virus)       Relevant Orders   HIV Antibody (routine testing w rflx)   Encounter for hepatitis C screening test for low risk patient       Relevant Orders   Hepatitis C antibody       Meds ordered this encounter  Medications   amoxicillin-clavulanate (AUGMENTIN) 875-125 MG tablet    Sig: Take 1 tablet by mouth 2 (two) times daily.    Dispense:  20 tablet    Refill:  0    Order Specific Question:   Supervising Provider    Answer:   Tresa Garter [[id number]]   albuterol (VENTOLIN HFA) 108 (90 Base) MCG/ACT inhaler    Sig: Inhale 2 puffs into the lungs every 6 (six) hours as needed for wheezing or shortness of breath.    Dispense:  8 g    Refill:  2    Order Specific Question:   Supervising Provider    Answer:   Tresa Garter [[id number]]   amLODipine (NORVASC) 10 MG tablet    Sig: Take 1 tablet (10 mg total) by mouth daily.    Dispense:  90 tablet    Refill:  1    Order Specific Question:   Supervising Provider    Answer:   Tresa Garter [[id number]]   esomeprazole (NEXIUM) 40 MG capsule    Sig:  Take 1 capsule (40 mg total) by mouth daily as needed (acid reflux).    Dispense:  90 capsule    Refill:  1    Order Specific Question:   Supervising Provider    Answer:   Tresa Garter [[id number]]   ezetimibe (ZETIA) 10 MG tablet    Sig: Take 1 tablet (10 mg total) by mouth daily.    Dispense:  90 tablet    Refill:  1    Order Specific Question:   Supervising Provider    Answer:   Tresa Garter [[id number]]   gabapentin (NEURONTIN) 300 MG capsule    Sig: Take 1 capsule (300 mg total) by mouth 2 (two) times daily.    Dispense:  180 capsule    Refill:  0    Order Specific Question:   Supervising Provider    Answer:   Tresa Garter [[id number]]   insulin detemir (LEVEMIR FLEXTOUCH) 100 UNIT/ML FlexPen    Sig: Inject 30 Units into the skin 2 (two) times daily.    Dispense:  54 mL    Refill:  1    Order Specific Question:   Supervising Provider    Answer:   Tresa Garter [[id number]]   lisinopril (ZESTRIL) 20 MG tablet    Sig: Take 1 tablet (20 mg total) by mouth daily.    Dispense:  90 tablet    Refill:  1    Order Specific Question:   Supervising Provider    Answer:   Tresa Garter [[id number]]   metFORMIN (GLUCOPHAGE) 500 MG tablet    Sig: Take 1 tablet (500 mg total) by mouth 2 (two) times daily with a meal.    Dispense:  180 tablet    Refill:  1    Order Specific Question:   Supervising Provider    Answer:   Tresa Garter [[id number]]   blood glucose meter kit and supplies KIT    Sig: Dispense based on patient and insurance preference. Use up to four times daily as directed.    Dispense:  1 each    Refill:  0    Order Specific Question:   Supervising Provider    Answer:   Tresa Garter [id number]    Order Specific Question:   Number of strips    Answer:   100    Order Specific Question:   Number of lancets    Answer:   100    Follow-up: Return in about 3 months (around 03/24/2022) for follow up DM [id number].    Vevelyn Francois, NP

## 2021-12-26 LAB — MICROALBUMIN / CREATININE URINE RATIO
Creatinine, Urine: 128.1 mg/dL
Microalb/Creat Ratio: 20 mg/g creat (ref 0–29)
Microalbumin, Urine: 25.1 ug/mL

## 2021-12-26 LAB — HEPATITIS C ANTIBODY: Hep C Virus Ab: <0.1 s/co ratio (ref 0.0–0.9)

## 2021-12-26 LAB — LIPID PANEL
Chol/HDL Ratio: 5.3 ratio — ABNORMAL HIGH (ref 0.0–5.0)
Cholesterol, Total: 276 mg/dL — ABNORMAL HIGH (ref 100–199)
HDL: 52 mg/dL (ref >39)
LDL Chol Calc (NIH): 199 mg/dL — ABNORMAL HIGH (ref 0–99)
Triglycerides: 137 mg/dL (ref 0–149)
VLDL Cholesterol Cal: 25 mg/dL (ref 5–40)

## 2021-12-26 LAB — COMP. METABOLIC PANEL (12)
AST: 13 IU/L (ref 0–40)
Albumin/Globulin Ratio: 1.1 — ABNORMAL LOW (ref 1.2–2.2)
Albumin: 4.2 g/dL (ref 4.0–5.0)
Alkaline Phosphatase: 124 IU/L — ABNORMAL HIGH (ref 44–121)
BUN/Creatinine Ratio: 18 (ref 9–20)
BUN: 17 mg/dL (ref 6–24)
Bilirubin Total: 0.4 mg/dL (ref 0.0–1.2)
Calcium: 10.1 mg/dL (ref 8.7–10.2)
Chloride: 98 mmol/L (ref 96–106)
Creatinine, Ser: 0.95 mg/dL (ref 0.76–1.27)
Globulin, Total: 3.7 g/dL (ref 1.5–4.5)
Glucose: 328 mg/dL — ABNORMAL HIGH (ref 70–99)
Potassium: 4.6 mmol/L (ref 3.5–5.2)
Sodium: 135 mmol/L (ref 134–144)
Total Protein: 7.9 g/dL (ref 6.0–8.5)
eGFR: 99 mL/min/{1.73_m2} (ref >59)

## 2021-12-26 LAB — HIV ANTIBODY (ROUTINE TESTING W REFLEX): HIV Screen 4th Generation wRfx: NONREACTIVE

## 2022-01-01 ENCOUNTER — Ambulatory Visit: Payer: Medicaid Other | Admitting: Nurse Practitioner

## 2022-01-11 ENCOUNTER — Other Ambulatory Visit: Payer: Self-pay | Admitting: Sports Medicine

## 2022-01-11 ENCOUNTER — Other Ambulatory Visit (HOSPITAL_COMMUNITY): Payer: Self-pay | Admitting: Sports Medicine

## 2022-01-11 DIAGNOSIS — M79604 Pain in right leg: Secondary | ICD-10-CM

## 2022-01-11 DIAGNOSIS — T85698A Other mechanical complication of other specified internal prosthetic devices, implants and grafts, initial encounter: Secondary | ICD-10-CM

## 2022-01-17 ENCOUNTER — Other Ambulatory Visit: Payer: Self-pay

## 2022-01-17 ENCOUNTER — Encounter (HOSPITAL_COMMUNITY)
Admission: RE | Admit: 2022-01-17 | Discharge: 2022-01-17 | Disposition: A | Discharge status: Home / Self Care | Payer: Medicaid Other | Source: Ambulatory Visit | Attending: Sports Medicine | Admitting: Sports Medicine

## 2022-01-17 ENCOUNTER — Ambulatory Visit (HOSPITAL_COMMUNITY)
Admission: RE | Admit: 2022-01-17 | Discharge: 2022-01-17 | Disposition: A | Discharge status: Home / Self Care | Payer: Medicaid Other | Source: Ambulatory Visit | Attending: Sports Medicine | Admitting: Sports Medicine

## 2022-01-17 DIAGNOSIS — M79605 Pain in left leg: Secondary | ICD-10-CM | POA: Insufficient documentation

## 2022-01-17 DIAGNOSIS — M79604 Pain in right leg: Secondary | ICD-10-CM | POA: Diagnosis present

## 2022-01-17 DIAGNOSIS — T85698A Other mechanical complication of other specified internal prosthetic devices, implants and grafts, initial encounter: Secondary | ICD-10-CM | POA: Diagnosis present

## 2022-01-17 MED ORDER — TECHNETIUM TC 99M MEDRONATE IV KIT
20.0000 | PACK | Freq: Once | INTRAVENOUS | Status: AC | PRN
  Administered 2022-01-17: 21.7 via INTRAVENOUS

## 2022-03-06 ENCOUNTER — Telehealth: Payer: Self-pay

## 2022-03-06 ENCOUNTER — Other Ambulatory Visit: Payer: Self-pay | Admitting: Nurse Practitioner

## 2022-03-06 NOTE — Telephone Encounter (Signed)
Pin needles to be refilled ?

## 2022-03-08 NOTE — Telephone Encounter (Signed)
Refills were sent to the patients pharmacy on 03/06/22 ?

## 2022-03-19 LAB — HM DIABETES EYE EXAM

## 2022-03-22 ENCOUNTER — Other Ambulatory Visit: Payer: Self-pay | Admitting: Nurse Practitioner

## 2022-03-26 ENCOUNTER — Ambulatory Visit: Payer: Medicaid Other | Admitting: Nurse Practitioner

## 2022-05-16 ENCOUNTER — Ambulatory Visit: Payer: Medicaid Other | Admitting: Nurse Practitioner

## 2022-05-24 ENCOUNTER — Other Ambulatory Visit: Payer: Self-pay | Admitting: Pharmacist

## 2022-05-24 ENCOUNTER — Other Ambulatory Visit: Payer: Self-pay | Admitting: Nurse Practitioner

## 2022-05-24 DIAGNOSIS — E1165 Type 2 diabetes mellitus with hyperglycemia: Secondary | ICD-10-CM

## 2022-05-24 DIAGNOSIS — I10 Essential (primary) hypertension: Secondary | ICD-10-CM

## 2022-05-24 NOTE — Chronic Care Management (AMB) (Signed)
Chief Complaint  Patient presents with   Hypertension    Jeffery Houston is a 51 y.o. year old male who presented for a telephone visit.   They were referred to the pharmacist by a quality report for assistance in managing diabetes and hypertension.   Patient is participating in a Managed Medicaid Plan:  Yes  Subjective:  Care Team: Primary Care Provider: Angus Seller ; Next Scheduled Visit: none, assisted in scheduling today  Medication Access/Adherence  Current Pharmacy:  [facility] [place], [place] - [address] [place] [address] Phone: [phone] Fax: [phone]   Patient reports affordability concerns with their medications: Yes  - though reports he is going to call [facility] today to see if they would let him set up a charge account Patient reports access/transportation concerns to their pharmacy: No  Patient reports adherence concerns with their medications:  Yes  is out of all of his medications besides Levenir   Diabetes:  Current medications: metformin 500 mg twice daily - is out of metformin, Levemir 30 units twice daily -  Medications tried in the past: reports prior allergy (itching) with Lantus  Current glucose readings: reports he had a hard time getting Libre CGM to stay stuck.   Hypertension:  Current medications: amlodipine 10 mg daily, lisinopril 20 mg daily - patient does not have either of these  Hyperlipidemia/ASCVD Risk Reduction  Current lipid lowering medications: ezetimibe 10 mg daily - patient does not have Medications tried in the past: patient reports he previously was on a statin, but does not remember which one. Reports a lab test that a previously provider did that the medication was impacting his muscles. Refuses to try alternative statins  Antiplatelet regimen: aspirin 81 mg daily   Health Maintenance  Health Maintenance Due  Topic Date Due   COVID-19 Vaccine (1) Never done    OPHTHALMOLOGY EXAM  Never done     Objective: Lab Results  Component Value Date   HGBA1C 10.0 (A) 12/25/2021   HGBA1C 10.0 12/25/2021   HGBA1C 10.0 (A) 12/25/2021   HGBA1C 10.0 (A) 12/25/2021    Lab Results  Component Value Date   CREATININE 0.95 12/25/2021   BUN 17 12/25/2021   NA 135 12/25/2021   K 4.6 12/25/2021   CL 98 12/25/2021   CO2 23 11/21/2021    Lab Results  Component Value Date   CHOL 276 (H) 12/25/2021   HDL 52 12/25/2021   LDLCALC 199 (H) 12/25/2021   TRIG 137 12/25/2021   CHOLHDL 5.3 (H) 12/25/2021    Medications Reviewed Today     Reviewed by Alden Hipp, RPH-CPP (Pharmacist) on 05/24/22 at 1354  Med List Status: <None>   Medication Order Taking? Sig Documenting Provider Last Dose Status Informant  acetaminophen (TYLENOL) 500 MG tablet [id number] Yes Take 1,000 mg by mouth daily as needed (shoulder pain). [provider] Taking Active Other  amLODipine (NORVASC) 10 MG tablet [id number] No Take 1 tablet by mouth daily.  Patient not taking: Reported on 05/24/2022   Orion Crook I, NP Not Taking Active   aspirin EC 81 MG tablet [id number] Yes Take 81 mg by mouth daily. [provider] Taking Active Self  blood glucose meter kit and supplies KIT [id number]  Dispense based on patient and insurance preference. Use up to four times daily as directed. Barbette Merino, NP  Active   Continuous Blood Gluc Receiver (FREESTYLE LIBRE 2 READER) DEVI [id number] No  1 application by Does not apply route every 14 (fourteen) days.  Patient not taking: Reported on 05/24/2022   Vevelyn Francois, NP Not Taking Active            Med Note Jodi Mourning, Emalie Mcwethy T   Thu May 24, 2022  1:51 PM)    esomeprazole (NEXIUM) 40 MG capsule [id number] Yes Take 1 capsule (40 mg total) by mouth daily as needed (acid reflux). Vevelyn Francois, NP Taking Active   ezetimibe (ZETIA) 10 MG tablet [id number] No TAKE 1 TABLET BY MOUTH EVERY DAY  Patient not taking: Reported on  05/24/2022   Bo Merino I, NP Not Taking Active   gabapentin (NEURONTIN) 300 MG capsule [id number] No Take 1 capsule (300 mg total) by mouth 2 (two) times daily.  Patient not taking: Reported on 05/24/2022   Vevelyn Francois, NP Not Taking Active   LEVEMIR FLEXTOUCH 100 UNIT/ML FlexTouch Pen [id number] Yes Inject 30 Units into the skin 2 (two) times daily. Bo Merino I, NP Taking Active   lisinopril (ZESTRIL) 20 MG tablet [id number] No Take 1 tablet (20 mg total) by mouth daily.  Patient not taking: Reported on 05/24/2022   Vevelyn Francois, NP Not Taking Active   meloxicam (MOBIC) 15 MG tablet [id number] Yes Take 15 mg by mouth daily as needed (shoulder pain).  [provider] Taking Active Multiple Informants  metFORMIN (GLUCOPHAGE) 500 MG tablet [id number] No Take 1 tablet (500 mg total) by mouth 2 (two) times daily with a meal.  Patient not taking: Reported on 05/24/2022   Vevelyn Francois, NP Not Taking Active   VENTOLIN HFA 108 (90 Base) MCG/ACT inhaler [id number] No Inhale 2 puffs into the lungs every 6 hours as needed for wheezing or shortness of breath.  Patient not taking: Reported on 05/24/2022   Teena Dunk, NP Not Taking Active   Med List Note Vevelyn Francois, NP 10/17/20 1436):                Assessment/Plan:   Diabetes: - Currently uncontrolled - Patient is going to see about setting up a charge account at [facility].  - For now, patient will restart metformin 500 mg twice daily and Levemir 30 units twice daily.  - Pending next A1c, recommend maximizing metformin to 1000 mg twice daily, using XR formulation if GI upset. Moving forward, consider initiation of GLP1 to reduce insulin burden.   Hypertension: - Currently uncontrolled - Reviewed appropriate blood pressure monitoring technique and reviewed goal blood pressure. Recommended to check home blood pressure and heart rate daily - Recommend to restart amlodipine and lisinopril.     Hyperlipidemia/ASCVD Risk Reduction: - Currently uncontrolled.  - Consider pursuing coverage for PCSK9i given anticipated lipid lowering of ezetimibe of only ~ 15%, which is unlikely to get patient to goal LDL <70. - Recommend to continue current regimen at this time   Follow Up Plan: phone call in 3 weeks  Catie Hedwig Morton, PharmD, [place] [phone]

## 2022-05-24 NOTE — Patient Instructions (Signed)
Siegfried,   It was great talking to you today!  Talk to [facility] about setting up a charge account. Let me know if they can't and we can see about transferring your medications to a pharmacy that can.   For now, restart metformin 500 mg twice daily and Levemir 30 units twice daily. Let us know if you develop diarrhea - metformin can cause this when starting, and if so, we can try the extended release formulation instead. We can also consider increasing the dose to the maximum daily dose of 2000 mg.   I'd like [place] to talk to [employer] about weekly non-insulin medications like Ozempic or Trulicity. If tolerated, these may provide great benefit and allow you to reduce or eliminate insulin.   Restart amlodipine and lisinopril.   Check your blood pressure once daily, and any time you have concerning symptoms like headache, chest pain, dizziness, shortness of breath, or vision changes.   Our goal is less than 130/80.  To appropriately check your blood pressure, make sure you do the following:  1) Avoid caffeine, exercise, or tobacco products for 30 minutes before checking. Empty your bladder. 2) Sit with your back supported in a flat-backed chair. Rest your arm on something flat (arm of the chair, table, etc). 3) Sit still with your feet flat on the floor, resting, for at least 5 minutes.  4) Check your blood pressure. Take 1-2 readings.  5) Write down these readings and bring with you to any provider appointments.  Bring your home blood pressure machine with you to a provider's office for accuracy comparison at least once a year.   Make sure you take your blood pressure medications before you come to any office visit, even if you were asked to fast for labs.   Take care!  Catie Clearance Coots, PharmD

## 2022-06-12 ENCOUNTER — Other Ambulatory Visit: Payer: Self-pay | Admitting: Pharmacist

## 2022-06-12 NOTE — Chronic Care Management (AMB) (Signed)
Chief Complaint  Patient presents with   Hypertension, Diabetes    Jeffery Houston is a 51 y.o. year old male who presented for a telephone visit.   They were referred to the pharmacist by a quality report for assistance in managing diabetes and hypertension.   Patient is participating in a Managed Medicaid Plan:  Yes  Subjective:  Care Team: Primary Care Provider: Lazaro Arms, NP ; Next Scheduled Visit: 06/18/22  Medication Access/Adherence  Current Pharmacy:  [facility] - [place], [place] - [address] [place] [address] Phone: [phone] Fax: [phone]  Patient reports adherence concerns with their medications:  Yes     Diabetes:  Current medications: Levemir 30 units BID, metformin 500 mg BID   Current glucose readings: 87 mg/dL this morning; highest value seen 150 mg/dL (post prandial)  Patient denies hypoglycemic s/sx including dizziness, shakiness, sweating. Patient reports improved hyperglycemic symptoms of polyuria (was going to the bathroom every hour during the night. Now only going once or twice)  Current meal patterns:  - Lunch/Supper: salads, fruit, vegetables (green beans, asparagus, brussels sprouts), chicken, fish, cutting out red meat  - Snacks: cut out cookies and cake   Hypertension:  Current medications: amlodipine 10 mg, lisinopril 20 mg   Patient has a validated, automated, upper arm home BP cuff. However, he has not picked up batteries for the machine yet.   Health Maintenance  Health Maintenance Due  Topic Date Due   COVID-19 Vaccine (1) Never done   FOOT EXAM  Never done   OPHTHALMOLOGY EXAM  Never done     Objective: Lab Results  Component Value Date   HGBA1C 10.0 (A) 12/25/2021   HGBA1C 10.0 12/25/2021   HGBA1C 10.0 (A) 12/25/2021   HGBA1C 10.0 (A) 12/25/2021    Lab Results  Component Value Date   CREATININE 0.95 12/25/2021   BUN 17 12/25/2021   NA 135 12/25/2021   K 4.6 12/25/2021    CL 98 12/25/2021   CO2 23 11/21/2021    Lab Results  Component Value Date   CHOL 276 (H) 12/25/2021   HDL 52 12/25/2021   LDLCALC 199 (H) 12/25/2021   TRIG 137 12/25/2021   CHOLHDL 5.3 (H) 12/25/2021    Medications Reviewed Today     Reviewed by Osker Mason, RPH-CPP (Pharmacist) on 06/12/22 at [phone]  Med List Status: <None>   Medication Order Taking? Sig Documenting Provider Last Dose Status Informant  acetaminophen (TYLENOL) 500 MG tablet [id number] Yes Take 1,000 mg by mouth daily as needed (shoulder pain). [provider] Taking Active Other  amLODipine (NORVASC) 10 MG tablet [id number] Yes Take 1 tablet by mouth daily. Bo Merino I, NP Taking Active   aspirin EC 81 MG tablet [id number] Yes Take 81 mg by mouth daily. [provider] Taking Active Self  blood glucose meter kit and supplies KIT [id number] Yes Dispense based on patient and insurance preference. Use up to four times daily as directed. Vevelyn Francois, NP Taking Active   Continuous Blood Gluc Receiver (FREESTYLE LIBRE 2 READER) DEVI [id number] No 1 application by Does not apply route every 14 (fourteen) days.  Patient not taking: Reported on 05/24/2022   Vevelyn Francois, NP Not Taking Active            Med Note Jodi Mourning, Tillie Fantasia May 24, 2022  1:51 PM)    esomeprazole (NEXIUM) 40 MG capsule [id number] Yes Take 1 capsule (40  mg total) by mouth daily as needed (acid reflux). Vevelyn Francois, NP Taking Active   ezetimibe (ZETIA) 10 MG tablet [id number] Yes TAKE 1 TABLET BY MOUTH EVERY DAY [place], [place] I, NP Taking Active   gabapentin (NEURONTIN) 300 MG capsule [id number] Yes Take 1 capsule (300 mg total) by mouth 2 (two) times daily. Vevelyn Francois, NP Taking Active   LEVEMIR FLEXTOUCH 100 UNIT/ML FlexTouch Pen [id number] Yes Inject 30 Units into the skin 2 (two) times daily. Bo Merino I, NP Taking Active   lisinopril (ZESTRIL) 20 MG tablet [id number] Yes TAKE 1 TABLET BY MOUTH EVERY  DAY [name], NP Taking Active   meloxicam (MOBIC) 15 MG tablet [id number] Yes Take 15 mg by mouth daily as needed (shoulder pain).  [provider] Taking Active Multiple Informants  metFORMIN (GLUCOPHAGE) 500 MG tablet [id number] Yes Take 1 tablet by mouth 2 times daily with a meal. [name], NP Taking Active   VENTOLIN HFA 108 (90 Base) MCG/ACT inhaler [id number] Yes Inhale 2 puffs into the lungs every 6 hours as needed for wheezing or shortness of breath. Bo Merino I, NP Taking Active   Med List Note Vevelyn Francois, NP 10/17/20 1436):               Assessment/Plan:   Diabetes: - Currently controlled per glucose readings - Reviewed goal A1c, goal fasting, and goal 2 hour post prandial glucose - Praised patient on dietary modifications implemented including incorporating more fruit, vegetables, and lean meat while cutting out cookies and cake - Recommend to start GLP-1 Ozempic 0.25 mg once weekly for 4 weeks, then increase to 0.5 mg once weekly thereafter at next PCP visit  - Recommending reducing Levemir to 22 units BID if Ozempic is started  - Recommend to continue to check glucose twice daily    Hypertension: - Currently uncontrolled based on previous office readings - Reviewed appropriate blood pressure monitoring technique and reviewed goal blood pressure. Recommended to check home blood pressure and heart rate as soon as he purchases batteries. Instructed patient to bring in blood pressure log to next PCP visit.  - Recommend to increase lisinopril to 40 mg daily if blood pressure is above goal at next office visit after restarting amlodipine and lisinopril.    Health Maintenance - Patient request refills on aspirin, gabapentin, and meloxicam. Defer until PCP appointment on 06/18/22.  Follow Up Plan: 4 weeks   Catie Hedwig Morton, PharmD, [facility] [phone]

## 2022-06-12 NOTE — Patient Instructions (Addendum)
Check your blood pressure twice daily, and any time you have concerning symptoms like headache, chest pain, dizziness, shortness of breath, or vision changes.   Our goal is less than 130/80.  To appropriately check your blood pressure, make sure you do the following:  1) Avoid caffeine, exercise, or tobacco products for 30 minutes before checking. Empty your bladder. 2) Sit with your back supported in a flat-backed chair. Rest your arm on something flat (arm of the chair, table, etc). 3) Sit still with your feet flat on the floor, resting, for at least 5 minutes.  4) Check your blood pressure. Take 1-2 readings.  5) Write down these readings and bring with you to any provider appointments.  Bring your home blood pressure machine with you to a provider's office for accuracy comparison at least once a year.   Make sure you take your blood pressure medications before you come to any office visit, even if you were asked to fast for labs.  We have made a reminder for Tonya and you to talk about a medication called Ozempic when you see her for your appointment.   Please call with any questions!  Catie Eppie Gibson, PharmD, [facility] [phone]

## 2022-06-18 ENCOUNTER — Ambulatory Visit: Payer: Medicaid Other | Admitting: Nurse Practitioner

## 2022-06-18 ENCOUNTER — Encounter: Payer: Self-pay | Admitting: Nurse Practitioner

## 2022-06-18 VITALS — BP 138/95 | HR 89 | Temp 98.3°F | Resp 18 | Ht 76.5 in | Wt 272.8 lb

## 2022-06-18 DIAGNOSIS — M1711 Unilateral primary osteoarthritis, right knee: Secondary | ICD-10-CM

## 2022-06-18 DIAGNOSIS — E1165 Type 2 diabetes mellitus with hyperglycemia: Secondary | ICD-10-CM

## 2022-06-18 DIAGNOSIS — N529 Male erectile dysfunction, unspecified: Secondary | ICD-10-CM

## 2022-06-18 LAB — POCT GLYCOSYLATED HEMOGLOBIN (HGB A1C)
HbA1c POC (<> result, manual entry): 7.1 % (ref 4.0–5.6)
HbA1c, POC (controlled diabetic range): 7.1 % — AB (ref 0.0–7.0)
HbA1c, POC (prediabetic range): 7.1 % — AB (ref 5.7–6.4)
Hemoglobin A1C: 7.1 % — AB (ref 4.0–5.6)

## 2022-06-18 MED ORDER — MELOXICAM 15 MG PO TABS: 15.0000 mg | ORAL_TABLET | Freq: Every day | ORAL | 0 refills | Status: AC | PRN

## 2022-06-18 MED ORDER — GABAPENTIN 300 MG PO CAPS: 300.0000 mg | ORAL_CAPSULE | Freq: Two times a day (BID) | ORAL | 0 refills | Status: DC

## 2022-06-18 MED ORDER — SILDENAFIL CITRATE 25 MG PO TABS: 25.0000 mg | ORAL_TABLET | Freq: Every day | ORAL | 0 refills | Status: DC | PRN

## 2022-06-18 MED ORDER — OZEMPIC (0.25 OR 0.5 MG/DOSE) 2 MG/3ML ~~LOC~~ SOPN: 0.2500 mg | PEN_INJECTOR | SUBCUTANEOUS | 2 refills | Status: DC

## 2022-06-18 NOTE — Patient Instructions (Addendum)
1. Uncontrolled type 2 diabetes mellitus with hyperglycemia (HCC)  Lab Results  Component Value Date   HGBA1C 7.1 (A) 06/18/2022   HGBA1C 7.1 06/18/2022   HGBA1C 7.1 (A) 06/18/2022   HGBA1C 7.1 (A) 06/18/2022   - Comprehensive metabolic panel - POCT glycosylated hemoglobin (Hb A1C) - Semaglutide,0.25 or 0.5MG /DOS, (OZEMPIC, 0.25 OR 0.5 MG/DOSE,) 2 MG/3ML SOPN; Inject 0.25 mg into the skin once a week. May increase dosage to 0.5 mg weekly after 4 weeks  Dispense: 3 mL; Refill: 2  - decrease insulin to 15 units twice daily  - continue metformin  2. Primary osteoarthritis of right knee  - meloxicam (MOBIC) 15 MG tablet; Take 1 tablet (15 mg total) by mouth daily as needed (shoulder pain).  Dispense: 30 tablet; Refill: 0 - gabapentin (NEURONTIN) 300 MG capsule; Take 1 capsule (300 mg total) by mouth 2 (two) times daily.  Dispense: 180 capsule; Refill: 0 - POCT glycosylated hemoglobin (Hb A1C)  3. Erectile dysfunction, unspecified erectile dysfunction type  - sildenafil (VIAGRA) 25 MG tablet; Take 1 tablet (25 mg total) by mouth daily as needed for erectile dysfunction.  Dispense: 10 tablet; Refill: 0  Follow up:  Follow up in 3 months

## 2022-06-18 NOTE — Assessment & Plan Note (Signed)
  Lab Results  Component Value Date   HGBA1C 7.1 (A) 06/18/2022   HGBA1C 7.1 06/18/2022   HGBA1C 7.1 (A) 06/18/2022   HGBA1C 7.1 (A) 06/18/2022   - Comprehensive metabolic panel - POCT glycosylated hemoglobin (Hb A1C) - Semaglutide,0.25 or 0.5MG /DOS, (OZEMPIC, 0.25 OR 0.5 MG/DOSE,) 2 MG/3ML SOPN; Inject 0.25 mg into the skin once a week. May increase dosage to 0.5 mg weekly after 4 weeks  Dispense: 3 mL; Refill: 2  - decrease insulin to 15 units twice daily  - continue metformin  2. Primary osteoarthritis of right knee  - meloxicam (MOBIC) 15 MG tablet; Take 1 tablet (15 mg total) by mouth daily as needed (shoulder pain).  Dispense: 30 tablet; Refill: 0 - gabapentin (NEURONTIN) 300 MG capsule; Take 1 capsule (300 mg total) by mouth 2 (two) times daily.  Dispense: 180 capsule; Refill: 0 - POCT glycosylated hemoglobin (Hb A1C)  3. Erectile dysfunction, unspecified erectile dysfunction type  - sildenafil (VIAGRA) 25 MG tablet; Take 1 tablet (25 mg total) by mouth daily as needed for erectile dysfunction.  Dispense: 10 tablet; Refill: 0  Follow up:  Follow up in 3 months

## 2022-06-18 NOTE — Progress Notes (Signed)
_0  ID: Jeffery Houston, male    DOB: Aug 04, 1971, 51 y.o.   MRN: [id number]  Chief Complaint  Patient presents with   Follow-up    Med Refill/check up.discuss Ozempic Pt needs refills on Gabapentin, and Meloxicam    Referring provider: No ref. provider found   HPI  Jeffery Houston presents for follow up. He  has a past medical history of Arthritis, Diabetes mellitus without complication ([place]), Exertional chest pain (01/23/2019), Exertional dyspnea (01/23/2019), and Hypertension.   Jeffery Houston is in today for diabetes follow up. The prescribed treatment is Levemir 30 units BID and metformin 500 mg BID along with Zetia 10 mg. blood sugars ranging 72 - 260s. Patient would like to come off of insulin. Would like to start ozempic for weight loss and diabetes. Will discuss this with pharmacist.    Patient is currently on lisinopril and Norvasc for hypertension. Patient states that he is compliant with these medications.   Denies f/c/s, n/v/d, hemoptysis, PND, leg swelling Denies chest pain or edema   Allergies  Allergen Reactions   Latex Hives    Immunization History  Administered Date(s) Administered   Tdap 04/11/2013, 05/26/2019    Past Medical History:  Diagnosis Date   Arthritis    bil knees   Diabetes mellitus without complication (HCC)    Exertional chest pain 01/23/2019   Exertional dyspnea 01/23/2019   Hypertension     Tobacco History: Social History   Tobacco Use  Smoking Status Former   Packs/day: 2.00   Types: Cigarettes   Quit date: 05/14/2010   Years since quitting: 12.1  Smokeless Tobacco Former   Counseling given: Not Answered   Outpatient Encounter Medications as of 06/18/2022  Medication Sig   acetaminophen (TYLENOL) 500 MG tablet Take 1,000 mg by mouth daily as needed (shoulder pain).   amLODipine (NORVASC) 10 MG tablet Take 1 tablet by mouth daily.   aspirin EC 81 MG tablet Take 81 mg by mouth daily.   blood glucose meter kit and supplies KIT Dispense  based on patient and insurance preference. Use up to four times daily as directed.   Continuous Blood Gluc Receiver (FREESTYLE LIBRE 2 READER) DEVI 1 application by Does not apply route every 14 (fourteen) days.   esomeprazole (NEXIUM) 40 MG capsule Take 1 capsule (40 mg total) by mouth daily as needed (acid reflux).   ezetimibe (ZETIA) 10 MG tablet TAKE 1 TABLET BY MOUTH EVERY DAY   lisinopril (ZESTRIL) 20 MG tablet TAKE 1 TABLET BY MOUTH EVERY DAY   metFORMIN (GLUCOPHAGE) 500 MG tablet Take 1 tablet by mouth 2 times daily with a meal.   Semaglutide,0.25 or 0.5MG/DOS, (OZEMPIC, 0.25 OR 0.5 MG/DOSE,) 2 MG/3ML SOPN Inject 0.25 mg into the skin once a week. May increase dosage to 0.5 mg weekly after 4 weeks   sildenafil (VIAGRA) 25 MG tablet Take 1 tablet (25 mg total) by mouth daily as needed for erectile dysfunction.   VENTOLIN HFA 108 (90 Base) MCG/ACT inhaler Inhale 2 puffs into the lungs every 6 hours as needed for wheezing or shortness of breath.   [DISCONTINUED] gabapentin (NEURONTIN) 300 MG capsule Take 1 capsule (300 mg total) by mouth 2 (two) times daily.   [DISCONTINUED] meloxicam (MOBIC) 15 MG tablet Take 15 mg by mouth daily as needed (shoulder pain).    gabapentin (NEURONTIN) 300 MG capsule Take 1 capsule (300 mg total) by mouth 2 (two) times daily.   LEVEMIR FLEXTOUCH 100 UNIT/ML FlexTouch Pen Inject 30 Units  into the skin 2 (two) times daily.   meloxicam (MOBIC) 15 MG tablet Take 1 tablet (15 mg total) by mouth daily as needed (shoulder pain).   No facility-administered encounter medications on file as of 06/18/2022.     Review of Systems  Review of Systems  Constitutional: Negative.   HENT: Negative.    Cardiovascular: Negative.   Gastrointestinal: Negative.   Allergic/Immunologic: Negative.   Neurological: Negative.   Psychiatric/Behavioral: Negative.         Physical Exam  BP (!) 138/95 (BP Location: Right Arm, Patient Position: Sitting, Cuff Size: Large)   Pulse  89   Temp 98.3 F (36.8 C)   Resp 18   Ht 6' 4.5" (1.943 m)   Wt 272 lb 12.8 oz (123.7 kg)   SpO2 96%   BMI 32.77 kg/m   Wt Readings from Last 5 Encounters:  06/18/22 272 lb 12.8 oz (123.7 kg)  12/25/21 265 lb 6.4 oz (120.4 kg)  11/21/21 263 lb (119.3 kg)  07/17/21 254 lb 0.2 oz (115.2 kg)  10/17/20 266 lb 9.6 oz (120.9 kg)     Physical Exam Vitals and nursing note reviewed.  Constitutional:      General: He is not in acute distress.    Appearance: He is well-developed.  Cardiovascular:     Rate and Rhythm: Normal rate and regular rhythm.  Pulmonary:     Effort: Pulmonary effort is normal.     Breath sounds: Normal breath sounds.  Skin:    General: Skin is warm and dry.  Neurological:     Mental Status: He is alert and oriented to person, place, and time.      Lab Results:  CBC    Component Value Date/Time   WBC 12.7 (H) 11/21/2021 0956   RBC 4.29 11/21/2021 0956   HGB 13.8 11/21/2021 0956   HGB 13.8 10/17/2020 1507   HCT 41.7 11/21/2021 0956   HCT 40.4 10/17/2020 1507   PLT 369 11/21/2021 0956   PLT 395 10/17/2020 1507   MCV 97.2 11/21/2021 0956   MCV 93 10/17/2020 1507   MCH 32.2 11/21/2021 0956   MCHC 33.1 11/21/2021 0956   RDW 11.5 11/21/2021 0956   RDW 11.5 (L) 10/17/2020 1507   LYMPHSABS 2.0 11/21/2021 0956   LYMPHSABS 2.6 10/17/2020 1507   MONOABS 0.9 11/21/2021 0956   EOSABS 0.1 11/21/2021 0956   EOSABS 0.1 10/17/2020 1507   BASOSABS 0.1 11/21/2021 0956   BASOSABS 0.0 10/17/2020 1507    BMET    Component Value Date/Time   NA 135 12/25/2021 0927   K 4.6 12/25/2021 0927   CL 98 12/25/2021 0927   CO2 23 11/21/2021 0956   GLUCOSE 328 (H) 12/25/2021 0927   GLUCOSE 308 (H) 11/21/2021 0956   BUN 17 12/25/2021 0927   CREATININE 0.95 12/25/2021 0927   CALCIUM 10.1 12/25/2021 0927   GFRNONAA >60 11/21/2021 0956   GFRAA 111 10/17/2020 1507      Assessment & Plan:   Uncontrolled type 2 diabetes mellitus with hyperglycemia (HCC)  Lab  Results  Component Value Date   HGBA1C 7.1 (A) 06/18/2022   HGBA1C 7.1 06/18/2022   HGBA1C 7.1 (A) 06/18/2022   HGBA1C 7.1 (A) 06/18/2022   - Comprehensive metabolic panel - POCT glycosylated hemoglobin (Hb A1C) - Semaglutide,0.25 or 0.5MG/DOS, (OZEMPIC, 0.25 OR 0.5 MG/DOSE,) 2 MG/3ML SOPN; Inject 0.25 mg into the skin once a week. May increase dosage to 0.5 mg weekly after 4 weeks  Dispense: 3 mL;  Refill: 2  - decrease insulin to 15 units twice daily  - continue metformin  2. Primary osteoarthritis of right knee  - meloxicam (MOBIC) 15 MG tablet; Take 1 tablet (15 mg total) by mouth daily as needed (shoulder pain).  Dispense: 30 tablet; Refill: 0 - gabapentin (NEURONTIN) 300 MG capsule; Take 1 capsule (300 mg total) by mouth 2 (two) times daily.  Dispense: 180 capsule; Refill: 0 - POCT glycosylated hemoglobin (Hb A1C)  3. Erectile dysfunction, unspecified erectile dysfunction type  - sildenafil (VIAGRA) 25 MG tablet; Take 1 tablet (25 mg total) by mouth daily as needed for erectile dysfunction.  Dispense: 10 tablet; Refill: 0  Follow up:  Follow up in 3 months     [name], NP 06/18/2022

## 2022-06-19 LAB — COMPREHENSIVE METABOLIC PANEL
ALT: 23 IU/L (ref 0–44)
AST: 12 IU/L (ref 0–40)
Albumin/Globulin Ratio: 1.6 (ref 1.2–2.2)
Albumin: 4.6 g/dL (ref 4.1–5.1)
Alkaline Phosphatase: 97 IU/L (ref 44–121)
BUN/Creatinine Ratio: 18 (ref 9–20)
BUN: 16 mg/dL (ref 6–24)
Bilirubin Total: 0.6 mg/dL (ref 0.0–1.2)
CO2: 23 mmol/L (ref 20–29)
Calcium: 10.1 mg/dL (ref 8.7–10.2)
Chloride: 101 mmol/L (ref 96–106)
Creatinine, Ser: 0.9 mg/dL (ref 0.76–1.27)
Globulin, Total: 2.8 g/dL (ref 1.5–4.5)
Glucose: 179 mg/dL — ABNORMAL HIGH (ref 70–99)
Potassium: 4.3 mmol/L (ref 3.5–5.2)
Sodium: 141 mmol/L (ref 134–144)
Total Protein: 7.4 g/dL (ref 6.0–8.5)
eGFR: 105 mL/min/{1.73_m2} (ref >59)

## 2022-06-21 ENCOUNTER — Other Ambulatory Visit: Payer: Self-pay | Admitting: Nurse Practitioner

## 2022-06-21 ENCOUNTER — Ambulatory Visit: Payer: Medicaid Other | Admitting: Nurse Practitioner

## 2022-06-21 ENCOUNTER — Telehealth: Payer: Self-pay

## 2022-06-21 NOTE — Telephone Encounter (Signed)
[facility] and ask for his Dexcom  Transmitter to be sent to them!

## 2022-06-22 ENCOUNTER — Other Ambulatory Visit: Payer: Self-pay | Admitting: Nurse Practitioner

## 2022-06-22 DIAGNOSIS — E1165 Type 2 diabetes mellitus with hyperglycemia: Secondary | ICD-10-CM

## 2022-06-22 MED ORDER — DEXCOM G6 TRANSMITTER MISC: 1.0000 | Freq: Two times a day (BID) | 0 refills | Status: DC

## 2022-06-25 ENCOUNTER — Other Ambulatory Visit: Payer: Self-pay | Admitting: Orthopedic Surgery

## 2022-06-25 ENCOUNTER — Other Ambulatory Visit (HOSPITAL_COMMUNITY): Payer: Self-pay | Admitting: Orthopedic Surgery

## 2022-06-25 DIAGNOSIS — T84033A Mechanical loosening of internal left knee prosthetic joint, initial encounter: Secondary | ICD-10-CM

## 2022-06-26 ENCOUNTER — Telehealth: Payer: Self-pay

## 2022-06-26 NOTE — Telephone Encounter (Signed)
Prior authorization started for [facility] G6 Transmitter waiting on insurance approval.

## 2022-06-27 ENCOUNTER — Telehealth: Payer: Self-pay

## 2022-06-27 NOTE — Telephone Encounter (Signed)
PA for Dexcom G6 Transmitter was denied by patient insurance.

## 2022-07-05 ENCOUNTER — Encounter (HOSPITAL_COMMUNITY)
Admission: RE | Admit: 2022-07-05 | Discharge: 2022-07-05 | Disposition: A | Discharge status: Home / Self Care | Payer: Medicaid Other | Source: Ambulatory Visit | Attending: Orthopedic Surgery | Admitting: Orthopedic Surgery

## 2022-07-05 DIAGNOSIS — T84033A Mechanical loosening of internal left knee prosthetic joint, initial encounter: Secondary | ICD-10-CM | POA: Insufficient documentation

## 2022-07-05 MED ORDER — TECHNETIUM TC 99M MEDRONATE IV KIT
20.0000 | PACK | Freq: Once | INTRAVENOUS | Status: AC | PRN
  Administered 2022-07-05: 20 via INTRAVENOUS

## 2022-07-13 NOTE — Telephone Encounter (Signed)
Please see if we can get him in next week sometime if there is an opening. Thanks.

## 2022-07-17 ENCOUNTER — Emergency Department (HOSPITAL_BASED_OUTPATIENT_CLINIC_OR_DEPARTMENT_OTHER): Payer: Medicaid Other | Admitting: Radiology

## 2022-07-17 ENCOUNTER — Emergency Department (HOSPITAL_BASED_OUTPATIENT_CLINIC_OR_DEPARTMENT_OTHER)
Admission: EM | Admit: 2022-07-17 | Discharge: 2022-07-17 | Disposition: A | Discharge status: Home / Self Care | Payer: Medicaid Other | Attending: Emergency Medicine | Admitting: Emergency Medicine

## 2022-07-17 ENCOUNTER — Other Ambulatory Visit: Payer: Self-pay

## 2022-07-17 ENCOUNTER — Encounter (HOSPITAL_BASED_OUTPATIENT_CLINIC_OR_DEPARTMENT_OTHER): Payer: Self-pay | Admitting: Obstetrics and Gynecology

## 2022-07-17 DIAGNOSIS — E119 Type 2 diabetes mellitus without complications: Secondary | ICD-10-CM | POA: Insufficient documentation

## 2022-07-17 DIAGNOSIS — Z7984 Long term (current) use of oral hypoglycemic drugs: Secondary | ICD-10-CM | POA: Diagnosis not present

## 2022-07-17 DIAGNOSIS — Z20822 Contact with and (suspected) exposure to covid-19: Secondary | ICD-10-CM | POA: Diagnosis not present

## 2022-07-17 DIAGNOSIS — R0981 Nasal congestion: Secondary | ICD-10-CM | POA: Diagnosis present

## 2022-07-17 DIAGNOSIS — J0120 Acute ethmoidal sinusitis, unspecified: Secondary | ICD-10-CM | POA: Diagnosis not present

## 2022-07-17 DIAGNOSIS — Z794 Long term (current) use of insulin: Secondary | ICD-10-CM | POA: Insufficient documentation

## 2022-07-17 DIAGNOSIS — Z91040 Latex allergy status: Secondary | ICD-10-CM | POA: Diagnosis not present

## 2022-07-17 DIAGNOSIS — Z7982 Long term (current) use of aspirin: Secondary | ICD-10-CM | POA: Insufficient documentation

## 2022-07-17 LAB — RESP PANEL BY RT-PCR (FLU A&B, COVID) ARPGX2
Influenza A by PCR: NEGATIVE
Influenza B by PCR: NEGATIVE
SARS Coronavirus 2 by RT PCR: NEGATIVE

## 2022-07-17 MED ORDER — AMOXICILLIN-POT CLAVULANATE 875-125 MG PO TABS: 1.0000 | ORAL_TABLET | Freq: Two times a day (BID) | ORAL | 0 refills | Status: DC

## 2022-07-17 MED ORDER — AMOXICILLIN-POT CLAVULANATE 875-125 MG PO TABS: 1.0000 | ORAL_TABLET | Freq: Once | ORAL | Status: DC

## 2022-07-17 NOTE — ED Notes (Signed)
Patient given discharge instructions. Questions were answered. Patient verbalized understanding of discharge instructions and care at home.  

## 2022-07-17 NOTE — ED Triage Notes (Signed)
Patient reports to the ER for a reported sinus congestion x2 weeks. Patient denies testing for COVID at home. Patient reports no chest pain but reports some shortness of breath when he coughs.

## 2022-07-17 NOTE — ED Provider Notes (Signed)
[place] EMERGENCY DEPT Provider Note   CSN: [id number] Arrival date & time: 07/17/22  4818     History  Chief Complaint  Patient presents with   Nasal Congestion    Jeffery Houston is a 51 y.o. male.  Pt complains of 2 weeks of sinus pressure and congestion.  Pt has a history of sinus infection since having a nasal fracture.    The history is provided by the patient. No language interpreter was used.  URI Presenting symptoms: congestion, cough and facial pain   Severity:  Moderate Onset quality:  Gradual Duration:  2 weeks Timing:  Constant Progression:  Worsening Chronicity:  New Worsened by:  Nothing Ineffective treatments:  None tried Associated symptoms: sinus pain   Risk factors: diabetes mellitus   Risk factors: no sick contacts        Home Medications Prior to Admission medications   Medication Sig Start Date End Date Taking? Authorizing Provider  amoxicillin-clavulanate (AUGMENTIN) 875-125 MG tablet Take 1 tablet by mouth 2 (two) times daily. 07/17/22  Yes Fransico Meadow, PA-C  acetaminophen (TYLENOL) 500 MG tablet Take 1,000 mg by mouth daily as needed (shoulder pain).    [provider]  amLODipine (NORVASC) 10 MG tablet Take 1 tablet by mouth daily. 03/23/22   Bo Merino I, NP  aspirin EC 81 MG tablet Take 81 mg by mouth daily.    [provider]  blood glucose meter kit and supplies KIT Dispense based on patient and insurance preference. Use up to four times daily as directed. 12/25/21   Vevelyn Francois, NP  Continuous Blood Gluc Receiver (FREESTYLE LIBRE 2 READER) DEVI 1 application by Does not apply route every 14 (fourteen) days. 10/17/20   Vevelyn Francois, NP  Continuous Blood Gluc Transmit (DEXCOM G6 TRANSMITTER) MISC 1 each by Does not apply route 2 (two) times daily. 06/22/22 09/20/22  [name], NP  esomeprazole (NEXIUM) 40 MG capsule Take 1 capsule (40 mg total) by mouth daily as needed (acid reflux). 12/25/21    Vevelyn Francois, NP  ezetimibe (ZETIA) 10 MG tablet TAKE 1 TABLET BY MOUTH EVERY DAY 03/23/22   Passmore, Jake Church I, NP  gabapentin (NEURONTIN) 300 MG capsule Take 1 capsule (300 mg total) by mouth 2 (two) times daily. 06/18/22 09/16/22  [name], NP  LEVEMIR FLEXTOUCH 100 UNIT/ML FlexTouch Pen Inject 30 Units into the skin 2 (two) times daily. 03/06/22 06/12/22  Bo Merino I, NP  lisinopril (ZESTRIL) 20 MG tablet TAKE 1 TABLET BY MOUTH EVERY DAY 05/25/22   [name], NP  meloxicam (MOBIC) 15 MG tablet Take 1 tablet (15 mg total) by mouth daily as needed (shoulder pain). 06/18/22 07/18/22  [name], NP  metFORMIN (GLUCOPHAGE) 500 MG tablet Take 1 tablet by mouth 2 times daily with a meal. 05/25/22   [name], NP  Semaglutide,0.25 or 0.5MG/DOS, (OZEMPIC, 0.25 OR 0.5 MG/DOSE,) 2 MG/3ML SOPN Inject 0.25 mg into the skin once a week. May increase dosage to 0.5 mg weekly after 4 weeks 06/18/22   [name], NP  sildenafil (VIAGRA) 25 MG tablet Take 1 tablet (25 mg total) by mouth daily as needed for erectile dysfunction. 06/18/22   [name], NP  VENTOLIN HFA 108 (90 Base) MCG/ACT inhaler Inhale 2 puffs into the lungs every 6 hours as needed for wheezing or shortness of breath. 03/23/22   Bo Merino I, NP      Allergies  Latex    Review of Systems   Review of Systems  HENT:  Positive for congestion and sinus pain.   Respiratory:  Positive for cough.   All other systems reviewed and are negative.   Physical Exam Updated Vital Signs BP (!) 145/109 (BP Location: Right Arm)   Pulse 78   Temp (!) 97.5 F (36.4 C)   Resp 18   Ht 6' 4.5" (1.943 m)   Wt 123.8 kg   SpO2 95%   BMI 32.80 kg/m  Physical Exam Vitals and nursing note reviewed.  Constitutional:      General: He is not in acute distress.    Appearance: He is well-developed.  HENT:     Head: Normocephalic and atraumatic.     Mouth/Throat:     Comments: Nasal congestion  Eyes:      Conjunctiva/sclera: Conjunctivae normal.  Cardiovascular:     Rate and Rhythm: Normal rate and regular rhythm.     Heart sounds: No murmur heard. Pulmonary:     Effort: Pulmonary effort is normal. No respiratory distress.     Breath sounds: Normal breath sounds.  Musculoskeletal:        General: No swelling.     Cervical back: Neck supple.  Skin:    General: Skin is warm and dry.     Capillary Refill: Capillary refill takes less than 2 seconds.  Neurological:     Mental Status: He is alert.  Psychiatric:        Mood and Affect: Mood normal.     ED Results / Procedures / Treatments   Labs (all labs ordered are listed, but only abnormal results are displayed) Labs Reviewed  RESP PANEL BY RT-PCR (FLU A&B, COVID) [id number]    EKG None  Radiology DG Chest 2 View  Result Date: 07/17/2022 CLINICAL DATA:  Cough EXAM: CHEST - 2 VIEW COMPARISON:  09/08/2020 FINDINGS: The heart size and mediastinal contours are within normal limits. No focal airspace consolidation, pleural effusion, or pneumothorax. The visualized skeletal structures are unremarkable. IMPRESSION: No active cardiopulmonary disease. Electronically Signed   By: Davina Poke D.O.   On: 07/17/2022 10:12    Procedures Procedures    Medications Ordered in ED Medications - No data to display  ED Course/ Medical Decision Making/ A&P                           Medical Decision Making Problems Addressed: Acute ethmoidal sinusitis, recurrence not specified: acute illness or injury    Details: Pt has a history of sinus infections  Amount and/or Complexity of Data Reviewed Independent Historian: parent Labs: ordered. Decision-making details documented in ED Course.    Details: Covid is negative  Labs ordered reviewed and interpreted   Radiology: ordered and independent interpretation performed. Decision-making details documented in ED Course.    Details: Chest xray  no acute   Risk Prescription drug  management.           Final Clinical Impression(s) / ED Diagnoses Final diagnoses:  Acute ethmoidal sinusitis, recurrence not specified    Rx / DC Orders ED Discharge Orders          Ordered    amoxicillin-clavulanate (AUGMENTIN) 875-125 MG tablet  2 times daily        07/17/22 1258           An After Visit Summary was printed and given to the patient.    Fransico Meadow,  PA-C 07/17/22 1306    [name], MD 07/17/22 1752

## 2022-07-17 NOTE — Discharge Instructions (Addendum)
Return if any problems.  Follow up with your Physician as scheduled for recheck

## 2022-07-18 ENCOUNTER — Other Ambulatory Visit: Payer: Medicaid Other | Admitting: Pharmacist

## 2022-07-18 ENCOUNTER — Ambulatory Visit: Payer: Medicaid Other

## 2022-07-18 ENCOUNTER — Encounter: Payer: Self-pay | Admitting: Pharmacist

## 2022-07-18 DIAGNOSIS — I10 Essential (primary) hypertension: Secondary | ICD-10-CM

## 2022-07-18 DIAGNOSIS — E1165 Type 2 diabetes mellitus with hyperglycemia: Secondary | ICD-10-CM

## 2022-07-18 MED ORDER — DEXCOM G7 SENSOR MISC: 3 refills | Status: DC

## 2022-07-18 NOTE — Patient Instructions (Signed)
Nevin,   It was great talking to you today!  Continue your current regimen of metformin 500 mg twice daily, Levemir 15 units twice daily, and Ozempic 0.25 mg weekly. You should hold Ozempic for a week before surgery, but you should be able to start back after that. When you increase to 0.5 mg, please call me if you start to see any issues with low blood sugars- we will likely be able to further reduce your Levemir dose.   Let's plan to check your cholesterol. We should start you on a medication to help lower your cholesterol and reduce your risk of heart attacks and strokes - rosuvastatin is a medication that is less likely to cause muscle symptoms than the lovastatin you had been on before.   Check your blood pressure twice weekly, and any time you have concerning symptoms like headache, chest pain, dizziness, shortness of breath, or vision changes.   Our goal is less than 130/80.  To appropriately check your blood pressure, make sure you do the following:  1) Avoid caffeine, exercise, or tobacco products for 30 minutes before checking. Empty your bladder. 2) Sit with your back supported in a flat-backed chair. Rest your arm on something flat (arm of the chair, table, etc). 3) Sit still with your feet flat on the floor, resting, for at least 5 minutes.  4) Check your blood pressure. Take 1-2 readings.  5) Write down these readings and bring with you to any provider appointments.  Bring your home blood pressure machine with you to a provider's office for accuracy comparison at least once a year.   Make sure you take your blood pressure medications before you come to any office visit, even if you were asked to fast for labs.  Take care!  Catie Eppie Gibson, PharmD, [facility] [phone]

## 2022-07-18 NOTE — Progress Notes (Signed)
Chief Complaint  Patient presents with   Diabetes   Hypertension    Jeffery Houston is a 51 y.o. year old male who presented for a telephone visit.   They were referred to the pharmacist by their PCP for assistance in managing diabetes, hypertension, and hyperlipidemia.   Patient is participating in a Managed Medicaid Plan:  Yes  Subjective:  Care Team: Primary Care Provider: [name], NP ; Next Scheduled Visit: 07/25/22  Medication Access/Adherence  Current Pharmacy:  [facility] - [place], [place] - [address] [place] [address] Phone: [phone] Fax: [phone]   Patient reports affordability concerns with their medications: No  Patient reports access/transportation concerns to their pharmacy: No  Patient reports adherence concerns with their medications:  No     Diabetes:  Current medications: metformin 500 mg twice daily, Levemir 15 units twice daily, Ozempic 0.25 mg weekly  Current glucose readings: reports fastings 70-100s  Denies episodes of hypoglycemia.   Reports that he is in the process of getting set up for a knee replacement. Reports the team instructed him to avoid increasing the dose, and to hold for a week before and for 2 weeks after surgery.   Also reports PA for DexCom G6 was denied.   Hypertension:  Current medications: lisinopril 20 mg daily, amlodipine 10 mg daily  Patient has a validated, automated, upper arm home BP cuff but does not have batteries  Reports he has not been checking his blood pressure recently as he has felt that it is normal.  Hyperlipidemia/ASCVD Risk Reduction  Current lipid lowering medications: ezetimibe 10 mg  Medications tried in the past: lovastatin - reports muscle cramps  Antiplatelet regimen: aspirin 81 mg daily   Health Maintenance  Health Maintenance Due  Topic Date Due   COVID-19 Vaccine (1) Never done   FOOT EXAM  Never done   OPHTHALMOLOGY EXAM  Never  done   COLONOSCOPY (Pts 45-52yr Insurance coverage will need to be confirmed)  Never done   INFLUENZA VACCINE  06/19/2022     Objective: Lab Results  Component Value Date   HGBA1C 7.1 (A) 06/18/2022   HGBA1C 7.1 06/18/2022   HGBA1C 7.1 (A) 06/18/2022   HGBA1C 7.1 (A) 06/18/2022    Lab Results  Component Value Date   CREATININE 0.90 06/18/2022   BUN 16 06/18/2022   NA 141 06/18/2022   K 4.3 06/18/2022   CL 101 06/18/2022   CO2 23 06/18/2022    Lab Results  Component Value Date   CHOL 276 (H) 12/25/2021   HDL 52 12/25/2021   LDLCALC 199 (H) 12/25/2021   TRIG 137 12/25/2021   CHOLHDL 5.3 (H) 12/25/2021    Medications Reviewed Today     Reviewed by CEino Farber CPhT (Pharmacy Technician) on 07/17/22 at 1259  Med List Status: Being Discharged   Medication Order Taking? Sig Documenting Provider Last Dose Status Informant  acetaminophen (TYLENOL) 500 MG tablet [id number] Take 1,000 mg by mouth daily as needed (shoulder pain). [provider]  Active Other  amLODipine (NORVASC) 10 MG tablet [id number] Take 1 tablet by mouth daily. PBo MerinoI, NP  Active   aspirin EC 81 MG tablet [id number] Take 81 mg by mouth daily. [provider]  Active Self  blood glucose meter kit and supplies KIT [id number] Dispense based on patient and insurance preference. Use up to four times daily as directed. KVevelyn Francois NP  Active   Continuous Blood Gluc Receiver (FREESTYLE LIBRE 2 READER) DEVI [id number]  1 application by Does not apply route every 14 (fourteen) days. Vevelyn Francois, NP  Active            Med Note Jodi Mourning, Caidyn Henricksen T   Thu May 24, 2022  1:51 PM)    Continuous Blood Gluc Transmit (DEXCOM G6 TRANSMITTER) MISC [id number]  1 each by Does not apply route 2 (two) times daily. [name], NP  Active   esomeprazole (NEXIUM) 40 MG capsule [id number]  Take 1 capsule (40 mg total) by mouth daily as needed (acid reflux). Vevelyn Francois, NP  Active    ezetimibe (ZETIA) 10 MG tablet [id number]  TAKE 1 TABLET BY MOUTH EVERY DAY Passmore, [place] I, NP  Active   gabapentin (NEURONTIN) 300 MG capsule [id number]  Take 1 capsule (300 mg total) by mouth 2 (two) times daily. [name], NP  Active   LEVEMIR FLEXTOUCH 100 UNIT/ML FlexTouch Pen [id number]  Inject 30 Units into the skin 2 (two) times daily. Bo Merino I, NP  Expired 06/12/22 2359   lisinopril (ZESTRIL) 20 MG tablet [id number]  TAKE 1 TABLET BY MOUTH EVERY DAY [name], NP  Active   meloxicam (MOBIC) 15 MG tablet [id number]  Take 1 tablet (15 mg total) by mouth daily as needed (shoulder pain). [name], NP  Active   metFORMIN (GLUCOPHAGE) 500 MG tablet [id number]  Take 1 tablet by mouth 2 times daily with a meal. [name], NP  Active   Semaglutide,0.25 or 0.5MG/DOS, (OZEMPIC, 0.25 OR 0.5 MG/DOSE,) 2 MG/3ML SOPN [id number]  Inject 0.25 mg into the skin once a week. May increase dosage to 0.5 mg weekly after 4 weeks [name], NP  Active   sildenafil (VIAGRA) 25 MG tablet [id number]  Take 1 tablet (25 mg total) by mouth daily as needed for erectile dysfunction. [name], NP  Active   VENTOLIN HFA 108 (90 Base) MCG/ACT inhaler [id number]  Inhale 2 puffs into the lungs every 6 hours as needed for wheezing or shortness of breath. Teena Dunk, NP  Active   Med List Note Vevelyn Francois, [place] 10/17/20 1436):                Assessment/Plan:   Diabetes: - Currently uncontrolled - Reviewed perioperative recommendations for GLP1. It is recommended to hold for a week prior to procedure, but there are no recommendations regarding holding after. Called [facility], spoke with PA on the team. They agree, and they are unaware of their teams giving any instructions about postoperative resumption. Communicated this to patient. Agree to hold off on increasing to 0.5 mg dose until after surgery.  - Completed PA for DexCom G7 in Cover My Meds; approved.  Collaborated with PCP, she was in agreement with G7 prescription. Prescription sent to the pharmacy. Once patient starts using, will collaborate to connect to [facility] patient portal  - Recommend to continue current regimen at this time   Hypertension: - Currently uncontrolled - Reviewed long term cardiovascular and renal outcomes of uncontrolled blood pressure - Reviewed appropriate blood pressure monitoring technique and reviewed goal blood pressure. Recommended to check home blood pressure and heart rate daily, even if he feels like it is normal. - Recommend to continue current regimen at this time   Hyperlipidemia/ASCVD Risk Reduction: - Currently uncontrolled.  - Recommend to check lipids at next appointment. Anticipate LDL will be above goal <  70. Recommend starting rosuvastatin. Reviewed with patient that rosuvastatin is generally tolerated better than lovastatin given hydrophilicity.    Follow Up Plan: follow for surgery plans  Catie Hedwig Morton, PharmD, [facility] [phone]

## 2022-07-20 ENCOUNTER — Other Ambulatory Visit: Payer: Self-pay | Admitting: Nurse Practitioner

## 2022-07-20 ENCOUNTER — Telehealth: Payer: Self-pay | Admitting: Nurse Practitioner

## 2022-07-20 NOTE — Telephone Encounter (Signed)
Pt's pharmacy called asking for clarification on whether a G6 or G7 dexcom was ordered. Pharmacist stated the G6 transmitter and dexcom G7 sensor were sent in.  Please send corrcted prescription   You can call pharmacy at [phone]

## 2022-07-25 ENCOUNTER — Encounter: Payer: Self-pay | Admitting: Nurse Practitioner

## 2022-07-25 ENCOUNTER — Telehealth: Payer: Self-pay | Admitting: Nurse Practitioner

## 2022-07-25 ENCOUNTER — Ambulatory Visit: Payer: Medicaid Other | Admitting: Nurse Practitioner

## 2022-07-25 ENCOUNTER — Other Ambulatory Visit: Payer: Self-pay | Admitting: Pharmacist

## 2022-07-25 VITALS — BP 131/105 | HR 82 | Temp 97.3°F | Ht 76.5 in | Wt 267.6 lb

## 2022-07-25 DIAGNOSIS — M1711 Unilateral primary osteoarthritis, right knee: Secondary | ICD-10-CM | POA: Diagnosis not present

## 2022-07-25 DIAGNOSIS — E1165 Type 2 diabetes mellitus with hyperglycemia: Secondary | ICD-10-CM

## 2022-07-25 DIAGNOSIS — E785 Hyperlipidemia, unspecified: Secondary | ICD-10-CM | POA: Diagnosis not present

## 2022-07-25 DIAGNOSIS — J0100 Acute maxillary sinusitis, unspecified: Secondary | ICD-10-CM | POA: Diagnosis not present

## 2022-07-25 MED ORDER — PREDNISONE 20 MG PO TABS: 20.0000 mg | ORAL_TABLET | Freq: Every day | ORAL | 0 refills | Status: AC

## 2022-07-25 MED ORDER — EPINEPHRINE 0.3 MG/0.3ML IJ SOAJ: 0.3000 mg | INTRAMUSCULAR | 1 refills | Status: AC | PRN

## 2022-07-25 MED ORDER — GABAPENTIN 300 MG PO CAPS: 300.0000 mg | ORAL_CAPSULE | Freq: Three times a day (TID) | ORAL | 2 refills | Status: DC

## 2022-07-25 MED ORDER — BENZONATATE 100 MG PO CAPS: 100.0000 mg | ORAL_CAPSULE | Freq: Two times a day (BID) | ORAL | 0 refills | Status: DC | PRN

## 2022-07-25 NOTE — Progress Notes (Signed)
@Patient  ID: Jeffery Houston, male    DOB: June 02, 1971, 51 y.o.   MRN: [id number]  Chief Complaint  Patient presents with   Sinus Problem    Pt states he is here for sinus issues  coughing, yellow mucus, dizziness,     Referring provider: [name], NP   HPI  51 year old male past medical history of Arthritis, Diabetes mellitus without complication ([place]), Exertional chest pain (01/23/2019), Exertional dyspnea (01/23/2019), and Hypertension.   Patient presents today for sick visit.  Patient has been having congestion, cough, yellow sinus drainage, sinus pressure and pain, dizziness for 1 month.  He was seen in the ED and was prescribed Augmentin.  He is still currently taking this prescription and states that it has helped.  We discussed that we will give a short course of prednisone and cough medicine.  Patient can also take Mucinex.  Patient is requesting to have his cholesterol rechecked.  We can recheck this today. Denies f/c/s, n/v/d, hemoptysis, PND, leg swelling Denies chest pain or edema     Allergies  Allergen Reactions   Latex Hives    Immunization History  Administered Date(s) Administered   Tdap 04/11/2013, 05/26/2019    Past Medical History:  Diagnosis Date   Arthritis    bil knees   Diabetes mellitus without complication (HCC)    Exertional chest pain 01/23/2019   Exertional dyspnea 01/23/2019   Hypertension     Tobacco History: Social History   Tobacco Use  Smoking Status Former   Packs/day: 2.00   Types: Cigarettes   Quit date: 05/14/2010   Years since quitting: 12.2  Smokeless Tobacco Former   Counseling given: Not Answered   Outpatient Encounter Medications as of 07/25/2022  Medication Sig   acetaminophen (TYLENOL) 500 MG tablet Take 1,000 mg by mouth daily as needed (shoulder pain).   amLODipine (NORVASC) 10 MG tablet Take 1 tablet by mouth daily.   amoxicillin-clavulanate (AUGMENTIN) 875-125 MG tablet Take 1 tablet by mouth 2 (two) times daily.    aspirin EC 81 MG tablet Take 81 mg by mouth daily.   benzonatate (TESSALON) 100 MG capsule Take 1 capsule (100 mg total) by mouth 2 (two) times daily as needed for cough.   blood glucose meter kit and supplies KIT Dispense based on patient and insurance preference. Use up to four times daily as directed.   Continuous Blood Gluc Sensor (DEXCOM G7 SENSOR) MISC Use to check glucose continously   EPINEPHrine 0.3 mg/0.3 mL IJ SOAJ injection Inject 0.3 mg into the muscle as needed for anaphylaxis.   esomeprazole (NEXIUM) 40 MG capsule Take 1 capsule (40 mg total) by mouth daily as needed (acid reflux).   ezetimibe (ZETIA) 10 MG tablet TAKE 1 TABLET BY MOUTH EVERY DAY   LEVEMIR FLEXTOUCH 100 UNIT/ML FlexTouch Pen Inject 30 Units into the skin 2 (two) times daily.   lisinopril (ZESTRIL) 20 MG tablet TAKE 1 TABLET BY MOUTH EVERY DAY   metFORMIN (GLUCOPHAGE) 500 MG tablet Take 1 tablet by mouth 2 times daily with a meal.   predniSONE (DELTASONE) 20 MG tablet Take 1 tablet (20 mg total) by mouth daily with breakfast for 5 days.   Semaglutide,0.25 or 0.5MG /DOS, (OZEMPIC, 0.25 OR 0.5 MG/DOSE,) 2 MG/3ML SOPN Inject 0.25 mg into the skin once a week. May increase dosage to 0.5 mg weekly after 4 weeks   sildenafil (VIAGRA) 25 MG tablet Take 1 tablet (25 mg total) by mouth daily as needed for erectile dysfunction.  VENTOLIN HFA 108 (90 Base) MCG/ACT inhaler Inhale 2 puffs into the lungs every 6 hours as needed for wheezing or shortness of breath.   [DISCONTINUED] gabapentin (NEURONTIN) 300 MG capsule Take 1 capsule (300 mg total) by mouth 2 (two) times daily.   gabapentin (NEURONTIN) 300 MG capsule Take 1 capsule (300 mg total) by mouth 3 (three) times daily.   No facility-administered encounter medications on file as of 07/25/2022.     Review of Systems  Review of Systems  Constitutional: Negative.   HENT:  Positive for congestion, postnasal drip, sinus pressure and sinus pain.   Cardiovascular: Negative.    Gastrointestinal: Negative.   Allergic/Immunologic: Negative.   Neurological: Negative.   Psychiatric/Behavioral: Negative.         Physical Exam  BP (!) 131/105 (BP Location: Right Arm, Patient Position: Sitting, Cuff Size: Large)   Pulse 82   Temp (!) 97.3 F (36.3 C)   Ht 6' 4.5" (1.943 m)   Wt 267 lb 9.6 oz (121.4 kg)   SpO2 100%   BMI 32.15 kg/m   Wt Readings from Last 5 Encounters:  07/25/22 267 lb 9.6 oz (121.4 kg)  07/17/22 273 lb (123.8 kg)  06/18/22 272 lb 12.8 oz (123.7 kg)  12/25/21 265 lb 6.4 oz (120.4 kg)  11/21/21 263 lb (119.3 kg)     Physical Exam Vitals and nursing note reviewed.  Constitutional:      General: He is not in acute distress.    Appearance: He is well-developed.  Cardiovascular:     Rate and Rhythm: Normal rate and regular rhythm.  Pulmonary:     Effort: Pulmonary effort is normal.     Breath sounds: Normal breath sounds.  Skin:    General: Skin is warm and dry.  Neurological:     Mental Status: He is alert and oriented to person, place, and time.      Lab Results:  CBC    Component Value Date/Time   WBC 12.7 (H) 11/21/2021 0956   RBC 4.29 11/21/2021 0956   HGB 13.8 11/21/2021 0956   HGB 13.8 10/17/2020 1507   HCT 41.7 11/21/2021 0956   HCT 40.4 10/17/2020 1507   PLT 369 11/21/2021 0956   PLT 395 10/17/2020 1507   MCV 97.2 11/21/2021 0956   MCV 93 10/17/2020 1507   MCH 32.2 11/21/2021 0956   MCHC 33.1 11/21/2021 0956   RDW 11.5 11/21/2021 0956   RDW 11.5 (L) 10/17/2020 1507   LYMPHSABS 2.0 11/21/2021 0956   LYMPHSABS 2.6 10/17/2020 1507   MONOABS 0.9 11/21/2021 0956   EOSABS 0.1 11/21/2021 0956   EOSABS 0.1 10/17/2020 1507   BASOSABS 0.1 11/21/2021 0956   BASOSABS 0.0 10/17/2020 1507    BMET    Component Value Date/Time   NA 141 06/18/2022 1358   K 4.3 06/18/2022 1358   CL 101 06/18/2022 1358   CO2 23 06/18/2022 1358   GLUCOSE 179 (H) 06/18/2022 1358   GLUCOSE 308 (H) 11/21/2021 0956   BUN 16  06/18/2022 1358   CREATININE 0.90 06/18/2022 1358   CALCIUM 10.1 06/18/2022 1358   GFRNONAA >60 11/21/2021 0956   GFRAA 111 10/17/2020 1507    BNP No results found for: "BNP"  ProBNP No results found for: "PROBNP"  Imaging: DG Chest 2 View  Result Date: 07/17/2022 CLINICAL DATA:  Cough EXAM: CHEST - 2 VIEW COMPARISON:  09/08/2020 FINDINGS: The heart size and mediastinal contours are within normal limits. No focal airspace consolidation, pleural effusion,  or pneumothorax. The visualized skeletal structures are unremarkable. IMPRESSION: No active cardiopulmonary disease. Electronically Signed   By: Davina Poke D.O.   On: 07/17/2022 10:12   NM Bone Scan 3 Phase  Result Date: 07/05/2022 CLINICAL DATA:  LEFT knee pain since knee joint replacement surgery in 2016 and RIGHT knee joint replacement surgery 2017, diabetes mellitus, unknown date of RIGHT knee surgery EXAM: NUCLEAR MEDICINE 3-PHASE BONE SCAN TECHNIQUE: Radionuclide angiographic images, immediate static blood pool images, and 3-hour delayed static images were obtained of the knees after intravenous injection of radiopharmaceutical. RADIOPHARMACEUTICALS:  20.2 mCi Tc-50m MDP IV COMPARISON:  01/17/2022 Radiographic correlation: 01/21/2017 FINDINGS: Vascular phase: Minimally increased blood flow to periarticular regions of both knees Blood pool phase: Increased blood pool surrounding both knee joints Delayed phase: Photopenic defects at the medial compartments of both knees consistent with unicompartmental prostheses as identified on prior radiographs. Abnormal increased tracer uptake at the medial LEFT tibial plateau adjacent to the prosthesis consistent with aseptic loosening or infection. Mild degree of tracer uptake is seen at the medial RIGHT tibial plateau adjacent to the prosthesis, less than seen on LEFT. No abnormal tracer uptake at the femora. IMPRESSION: Mildly increased blood flow and blood pool of tracer at both knees. 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 Uptake at the RIGHT knee is nonspecific in the absence of symptoms; if patient is symptomatic at the RIGHT knee than aseptic loosening should be considered. Electronically Signed   By: Lavonia Dana M.D.   On: 07/05/2022 16:31     Assessment & Plan:   Hyperlipidemia - Lipid Panel  2. Acute non-recurrent maxillary sinusitis  - predniSONE (DELTASONE) 20 MG tablet; Take 1 tablet (20 mg total) by mouth daily with breakfast for 5 days.  Dispense: 5 tablet; Refill: 0 - benzonatate (TESSALON) 100 MG capsule; Take 1 capsule (100 mg total) by mouth 2 (two) times daily as needed for cough.  Dispense: 20 capsule; Refill: 0  Stay well hydrated  Stay active  Deep breathing exercises  May take tylenol or fever or pain  May take mucinex twice daily    Follow up:  Follow up as scheduled     [name], NP 07/25/2022

## 2022-07-25 NOTE — Telephone Encounter (Signed)
[employer]. Left voicemail that our intention is G7 sensor and G6 sensor/transmitter can be deleted. Left my direct number for call back if they had further questions.   Catie Eppie Gibson, PharmD, [facility] [phone]

## 2022-07-25 NOTE — Telephone Encounter (Signed)
Pharmacy called asking the DEX COM transmitter be changed to a G7 instead of G6 that was sent so that the transmitter and sensor are compatible  -friendly pharmacy

## 2022-07-25 NOTE — Assessment & Plan Note (Signed)
-   Lipid Panel  2. Acute non-recurrent maxillary sinusitis  - predniSONE (DELTASONE) 20 MG tablet; Take 1 tablet (20 mg total) by mouth daily with breakfast for 5 days.  Dispense: 5 tablet; Refill: 0 - benzonatate (TESSALON) 100 MG capsule; Take 1 capsule (100 mg total) by mouth 2 (two) times daily as needed for cough.  Dispense: 20 capsule; Refill: 0  Stay well hydrated  Stay active  Deep breathing exercises  May take tylenol or fever or pain  May take mucinex twice daily    Follow up:  Follow up as scheduled

## 2022-07-25 NOTE — Patient Instructions (Addendum)
1. Hyperlipidemia, unspecified hyperlipidemia type  - Lipid Panel  2. Acute non-recurrent maxillary sinusitis  - predniSONE (DELTASONE) 20 MG tablet; Take 1 tablet (20 mg total) by mouth daily with breakfast for 5 days.  Dispense: 5 tablet; Refill: 0 - benzonatate (TESSALON) 100 MG capsule; Take 1 capsule (100 mg total) by mouth 2 (two) times daily as needed for cough.  Dispense: 20 capsule; Refill: 0  Stay well hydrated  Stay active  Deep breathing exercises  May take tylenol or fever or pain  May take mucinex twice daily    Follow up:  Follow up as scheduled

## 2022-07-25 NOTE — Progress Notes (Signed)
Care Coordination Call  See phone note below. [employer]. Left voicemail that our intention is G7 sensor and G6 sensor/transmitter can be deleted.   Catie Eppie Gibson, PharmD, [facility] [phone]

## 2022-07-25 NOTE — Addendum Note (Signed)
Addended by: Nilda Simmer T on: 07/25/2022 12:53 PM   Modules accepted: Orders

## 2022-07-26 ENCOUNTER — Other Ambulatory Visit: Payer: Self-pay | Admitting: Pharmacist

## 2022-07-26 DIAGNOSIS — E785 Hyperlipidemia, unspecified: Secondary | ICD-10-CM

## 2022-07-26 LAB — LIPID PANEL
Chol/HDL Ratio: 4.1 ratio (ref 0.0–5.0)
Cholesterol, Total: 262 mg/dL — ABNORMAL HIGH (ref 100–199)
HDL: 64 mg/dL (ref >39)
LDL Chol Calc (NIH): 165 mg/dL — ABNORMAL HIGH (ref 0–99)
Triglycerides: 180 mg/dL — ABNORMAL HIGH (ref 0–149)
VLDL Cholesterol Cal: 33 mg/dL (ref 5–40)

## 2022-07-26 MED ORDER — ROSUVASTATIN CALCIUM 20 MG PO TABS: 20.0000 mg | ORAL_TABLET | Freq: Every day | ORAL | 3 refills | Status: DC

## 2022-07-26 NOTE — Patient Instructions (Addendum)
Lory,   Start rosuvastatin 20 mg daily. You can take this medication any time of the day. It is generally better tolerated than the lovastatin you had been on before.   Please let me know if you have any questions or concerns. Thanks!  Catie Eppie Gibson, PharmD, [facility] [phone]

## 2022-07-26 NOTE — Telephone Encounter (Signed)
Will call patient about this.

## 2022-07-26 NOTE — Progress Notes (Signed)
Chief Complaint  Patient presents with   Hypertension   Hyperlipidemia   Diabetes    Jeffery Houston is a 51 y.o. year old male who presented for a telephone visit.   They were referred to the pharmacist by their PCP for assistance in managing diabetes, hypertension, and hyperlipidemia.   Patient is participating in a Managed Medicaid Plan:  Yes  Subjective:  Care Team: Primary Care Provider: [name], NP ; Next Scheduled Visit: 09/19/22  Medication Access/Adherence  Current Pharmacy:  [facility] - [place], [place] - [address] [place] [address] Phone: [phone] Fax: [phone]   Hyperlipidemia/ASCVD Risk Reduction  Current lipid lowering medications: ezetimibe 10 mg daily Medications tried in the past: lovastatin - reports muscle aches  Antiplatelet regimen: aspirin 81 mg daily  Health Maintenance  Health Maintenance Due  Topic Date Due   COVID-19 Vaccine (1) Never done   FOOT EXAM  Never done   OPHTHALMOLOGY EXAM  Never done   COLONOSCOPY (Pts 45-56yrs Insurance coverage will need to be confirmed)  Never done   INFLUENZA VACCINE  Never done     Objective: Lab Results  Component Value Date   HGBA1C 7.1 (A) 06/18/2022   HGBA1C 7.1 06/18/2022   HGBA1C 7.1 (A) 06/18/2022   HGBA1C 7.1 (A) 06/18/2022    Lab Results  Component Value Date   CREATININE 0.90 06/18/2022   BUN 16 06/18/2022   NA 141 06/18/2022   K 4.3 06/18/2022   CL 101 06/18/2022   CO2 23 06/18/2022    Lab Results  Component Value Date   CHOL 262 (H) 07/25/2022   HDL 64 07/25/2022   LDLCALC 165 (H) 07/25/2022   TRIG 180 (H) 07/25/2022   CHOLHDL 4.1 07/25/2022    Medications Reviewed Today     Reviewed by [name], NP (Nurse Practitioner) on 07/25/22 at [phone]  Med List Status: <None>   Medication Order Taking? Sig Documenting Provider Last Dose Status Informant  acetaminophen (TYLENOL) 500 MG tablet [id number] Yes Take 1,000 mg  by mouth daily as needed (shoulder pain). [provider] Taking Active Other  amLODipine (NORVASC) 10 MG tablet [id number] Yes Take 1 tablet by mouth daily. Bo Merino I, NP Taking Active   amoxicillin-clavulanate (AUGMENTIN) 875-125 MG tablet [id number] Yes Take 1 tablet by mouth 2 (two) times daily. Fransico Meadow, [place] Taking Active   aspirin EC 81 MG tablet [id number] Yes Take 81 mg by mouth daily. [provider] Taking Active Self  benzonatate (TESSALON) 100 MG capsule [id number] Yes Take 1 capsule (100 mg total) by mouth 2 (two) times daily as needed for cough. [name], NP  Active   blood glucose meter kit and supplies KIT [id number] Yes Dispense based on patient and insurance preference. Use up to four times daily as directed. Vevelyn Francois, NP Taking Active   Continuous Blood Gluc Sensor (DEXCOM G7 SENSOR) MISC [id number] Yes Use to check glucose continously [name], NP Taking Active   EPINEPHrine 0.3 mg/0.3 mL IJ SOAJ injection [id number] Yes Inject 0.3 mg into the muscle as needed for anaphylaxis. [name], NP  Active   esomeprazole (NEXIUM) 40 MG capsule [id number] Yes Take 1 capsule (40 mg total) by mouth daily as needed (acid reflux). Vevelyn Francois, NP Taking Active   ezetimibe (ZETIA) 10 MG tablet [id number] Yes TAKE 1 TABLET BY MOUTH EVERY DAY [place], Jake Church I, NP Taking Active   gabapentin (  NEURONTIN) 300 MG capsule [id number]  Take 1 capsule (300 mg total) by mouth 3 (three) times daily. [name], NP  Active   LEVEMIR FLEXTOUCH 100 UNIT/ML FlexTouch Pen [id number] Yes Inject 30 Units into the skin 2 (two) times daily. Bo Merino I, NP Taking Active            Med Note Jodi Mourning, [place] Jul 18, 2022  9:36 AM) 15 units twice daily  lisinopril (ZESTRIL) 20 MG tablet [id number] Yes TAKE 1 TABLET BY MOUTH EVERY DAY [name], NP Taking Active   metFORMIN (GLUCOPHAGE) 500 MG tablet [id number] Yes Take 1 tablet by  mouth 2 times daily with a meal. [name], NP Taking Active   predniSONE (DELTASONE) 20 MG tablet [id number] Yes Take 1 tablet (20 mg total) by mouth daily with breakfast for 5 days. [name], NP  Active   Semaglutide,0.25 or 0.5MG/DOS, (OZEMPIC, 0.25 OR 0.5 MG/DOSE,) 2 MG/3ML SOPN [id number] Yes Inject 0.25 mg into the skin once a week. May increase dosage to 0.5 mg weekly after 4 weeks [name], NP Taking Active   sildenafil (VIAGRA) 25 MG tablet [id number] Yes Take 1 tablet (25 mg total) by mouth daily as needed for erectile dysfunction. [name], NP Taking Active   VENTOLIN HFA 108 (90 Base) MCG/ACT inhaler [id number] Yes Inhale 2 puffs into the lungs every 6 hours as needed for wheezing or shortness of breath. Teena Dunk, NP Taking Active   Med List Note Vevelyn Francois, [place] 10/17/20 1436):                Assessment/Plan:   Hyperlipidemia/ASCVD Risk Reduction: - Currently uncontrolled.  - Reviewed long term complications of uncontrolled cholesterol. Reviewed goal LDL <70 with T2DM + HTN - Recommend to start rosuvastatin 20 mg daily. Discussed with PCP, she is in agreement. Counseled patient this medication is generally well tolerated but to contact with any aches/pains. Will recheck lipids/CMP at next visit.    Follow Up Plan: phone call next week to discuss DM and HTN  Catie Hedwig Morton, PharmD, [facility] [phone]

## 2022-07-27 ENCOUNTER — Ambulatory Visit: Payer: Medicaid Other | Admitting: Cardiology

## 2022-07-27 ENCOUNTER — Encounter: Payer: Self-pay | Admitting: Cardiology

## 2022-07-27 VITALS — BP 136/81 | HR 97 | Temp 98.3°F | Resp 16 | Ht 76.0 in | Wt 271.4 lb

## 2022-07-27 DIAGNOSIS — Z794 Long term (current) use of insulin: Secondary | ICD-10-CM

## 2022-07-27 DIAGNOSIS — I10 Essential (primary) hypertension: Secondary | ICD-10-CM

## 2022-07-27 DIAGNOSIS — Z8249 Family history of ischemic heart disease and other diseases of the circulatory system: Secondary | ICD-10-CM

## 2022-07-27 DIAGNOSIS — E1165 Type 2 diabetes mellitus with hyperglycemia: Secondary | ICD-10-CM

## 2022-07-27 DIAGNOSIS — E785 Hyperlipidemia, unspecified: Secondary | ICD-10-CM

## 2022-07-27 DIAGNOSIS — Z87891 Personal history of nicotine dependence: Secondary | ICD-10-CM

## 2022-07-27 DIAGNOSIS — Z01818 Encounter for other preprocedural examination: Secondary | ICD-10-CM

## 2022-07-27 DIAGNOSIS — R0609 Other forms of dyspnea: Secondary | ICD-10-CM

## 2022-07-27 NOTE — Progress Notes (Signed)
ID:  Jeffery Houston, DOB 02-11-1971, MRN [id number]  PCP:  [name], NP  Cardiologist:  Rex Kras, DO, [facility] (established care 07/27/2022) Former Cardiology Providers: Dr. Vernell Leep  REASON FOR CONSULT: Preop clearance  REQUESTING PHYSICIAN:  [name], NP [place] [address], [place],  [place] [address]  Chief Complaint  Patient presents with   Pre-op Exam    HPI  Jeffery Houston is a 51 y.o. African-American male who presents to the clinic for evaluation of preop clearance at the request of [name], NP. His past medical history and cardiovascular risk factors include: Hypertension, insulin-dependent diabetes mellitus type 2, GERD, family history of premature CAD  Patient is being considered for left knee replacement.  Date of surgery is to be determined.  Denies angina pectoris or heart failure symptoms.  No structured exercise program or daily routine.  Referred to cardiology for preoperative risk assessment given his multiple comorbid conditions.  ALLERGIES: Allergies  Allergen Reactions   Latex Hives    MEDICATION LIST PRIOR TO VISIT: Current Meds  Medication Sig   amLODipine (NORVASC) 10 MG tablet Take 1 tablet by mouth daily.   amoxicillin-clavulanate (AUGMENTIN) 875-125 MG tablet Take 1 tablet by mouth 2 (two) times daily.   aspirin EC 81 MG tablet Take 81 mg by mouth daily.   benzonatate (TESSALON) 100 MG capsule Take 1 capsule (100 mg total) by mouth 2 (two) times daily as needed for cough.   blood glucose meter kit and supplies KIT Dispense based on patient and insurance preference. Use up to four times daily as directed.   Continuous Blood Gluc Sensor (DEXCOM G7 SENSOR) MISC Use to check glucose continously   EPINEPHrine 0.3 mg/0.3 mL IJ SOAJ injection Inject 0.3 mg into the muscle as needed for anaphylaxis.   esomeprazole (NEXIUM) 40 MG capsule Take 1 capsule (40 mg total) by mouth daily as needed (acid reflux).   ezetimibe (ZETIA)  10 MG tablet TAKE 1 TABLET BY MOUTH EVERY DAY   gabapentin (NEURONTIN) 300 MG capsule Take 1 capsule (300 mg total) by mouth 3 (three) times daily.   LEVEMIR FLEXTOUCH 100 UNIT/ML FlexTouch Pen Inject 30 Units into the skin 2 (two) times daily.   lisinopril (ZESTRIL) 20 MG tablet TAKE 1 TABLET BY MOUTH EVERY DAY   metFORMIN (GLUCOPHAGE) 500 MG tablet Take 1 tablet by mouth 2 times daily with a meal.   predniSONE (DELTASONE) 20 MG tablet Take 1 tablet (20 mg total) by mouth daily with breakfast for 5 days.   rosuvastatin (CRESTOR) 20 MG tablet Take 1 tablet (20 mg total) by mouth daily.   Semaglutide,0.25 or 0.5MG/DOS, (OZEMPIC, 0.25 OR 0.5 MG/DOSE,) 2 MG/3ML SOPN Inject 0.25 mg into the skin once a week. May increase dosage to 0.5 mg weekly after 4 weeks   sildenafil (VIAGRA) 25 MG tablet Take 1 tablet (25 mg total) by mouth daily as needed for erectile dysfunction.   VENTOLIN HFA 108 (90 Base) MCG/ACT inhaler Inhale 2 puffs into the lungs every 6 hours as needed for wheezing or shortness of breath.     PAST MEDICAL HISTORY: Past Medical History:  Diagnosis Date   Arthritis    bil knees   Diabetes mellitus without complication ([place])    Exertional chest pain 01/23/2019   Exertional dyspnea 01/23/2019   Hypertension     PAST SURGICAL HISTORY: Past Surgical History:  Procedure Laterality Date   FOOT SURGERY     PARTIAL KNEE  ARTHROPLASTY Left 07/22/2015   Procedure: LEF PARTIAL KNEE REPLACEMENT ;  Surgeon: Renette Butters, MD;  Location: [facility];  Service: Orthopedics;  Laterality: Left;   PARTIAL KNEE ARTHROPLASTY Right 10/05/2016   Procedure: UNICOMPARTMENTAL KNEE;  Surgeon: Renette Butters, MD;  Location: [facility];  Service: Orthopedics;  Laterality: Right;  Pre/Post Op femoral nerve block   WRIST SURGERY Right    tendon repair    FAMILY HISTORY: The patient family history includes Heart attack in his brother.  SOCIAL HISTORY:  The patient  reports  that he quit smoking about 12 years ago. His smoking use included cigarettes. He smoked an average of 2 packs per day. He has quit using smokeless tobacco. He reports current alcohol use. He reports that he does not use drugs.  REVIEW OF SYSTEMS: Review of Systems  Cardiovascular:  Positive for dyspnea on exertion (chronic and stable (due to sinus infection and heat)). Negative for chest pain, claudication, irregular heartbeat, leg swelling, near-syncope, orthopnea, palpitations, paroxysmal nocturnal dyspnea and syncope.  Respiratory:  Negative for shortness of breath.   Hematologic/Lymphatic: Negative for bleeding problem.  Musculoskeletal:  Negative for muscle cramps and myalgias.  Neurological:  Negative for dizziness and light-headedness.    PHYSICAL EXAM:    07/27/2022    1:41 PM 07/25/2022    9:21 AM 07/25/2022    9:19 AM  Vitals with BMI  Height _0   6' 4.5"  Weight 271 lbs 6 oz  267 lbs 10 oz  BMI 19.59  74.71  Systolic 855 015 868  Diastolic 81 257 95  Pulse 97 82 84    Physical Exam  Constitutional: No distress.  Age appropriate, hemodynamically stable.   Neck: No JVD present.  Cardiovascular: Normal rate, regular rhythm, S1 normal, S2 normal, intact distal pulses and normal pulses. Exam reveals no gallop, no S3 and no S4.  No murmur heard. Pulmonary/Chest: Effort normal and breath sounds normal. No stridor. He has no wheezes. He has no rales.  Abdominal: Soft. Bowel sounds are normal. He exhibits no distension. There is no abdominal tenderness.  Musculoskeletal:        General: No edema.     Cervical back: Neck supple.  Neurological: He is alert and oriented to person, place, and time. He has intact cranial nerves (2-12).  Skin: Skin is warm and moist.   CARDIAC DATABASE: EKG: 07/27/2022: Normal sinus rhythm, 90 bpm, left axis, left anterior fascicular block, LAE, without underlying injury pattern.  Echocardiogram: 07/17/2018: Left ventricle cavity is normal in  size. Moderate concentric hypertrophy of the left ventricle. Normal global wall motion. Doppler evidence of grade I (impaired) diastolic dysfunction, normal LAP. Calculated EF 66%. Left atrial cavity is moderately dilated at 4.9 cm. Trace tricuspid regurgitation.   Stress Testing: Exercise myoview stress 06/23/2018: 1. The patient performed treadmill exercise using Bruce protocol, completing 9:00 minutes. The patient completed an estimated workload of 10.2 METS, reaching 89% of the maximum predicted heart rate. Normal hemodynamic response. No stress symptoms reported. Normal exercise capacity. Stress EKG is non diagnostic for ischemia as it is a pharmacologic stress. 2. The overall quality of the study is fair. There is no evidence of abnormal lung activity. Stress SPECT images demonstrate homogeneous tracer distribution throughout the myocardium. Basal inferior, inferoseptal, and inferolateral perfusion defect seen on rest SPECT images, likely suggest soft tissue attenuation. Gated SPECT imaging reveals normal myocardial thickening and wall motion. The left ventricular ejection fraction was normal (68%).  3. Low risk study.   Heart Catheterization: None  LABORATORY DATA:    Latest Ref Rng & Units 11/21/2021    9:56 AM 07/13/2021    9:41 PM 10/17/2020    3:07 PM  CBC  WBC 4.0 - 10.5 K/uL 12.7  10.1  7.9   Hemoglobin 13.0 - 17.0 g/dL 13.8  14.0  13.8   Hematocrit 39.0 - 52.0 % 41.7  42.7  40.4   Platelets 150 - 400 K/uL 369  364  395        Latest Ref Rng & Units 06/18/2022    1:58 PM 12/25/2021    9:27 AM 11/21/2021    9:56 AM  CMP  Glucose 70 - 99 mg/dL 179  328  308   BUN 6 - 24 mg/dL _0 Creatinine 0.76 - 1.27 mg/dL 0.90  0.95  0.81   Sodium 134 - 144 mmol/L 141  135  136   Potassium 3.5 - 5.2 mmol/L 4.3  4.6  3.8   Chloride 96 - 106 mmol/L 101  98  102   CO2 20 - 29 mmol/L 23   23   Calcium 8.7 - 10.2 mg/dL 10.1  10.1  9.1   Total Protein 6.0 - 8.5 g/dL 7.4  7.9  7.7    Total Bilirubin 0.0 - 1.2 mg/dL 0.6  0.4  1.2   Alkaline Phos 44 - 121 IU/L 97  124  89   AST 0 - 40 IU/L _1 ALT 0 - 44 IU/L 23   25     Lipid Panel     Component Value Date/Time   CHOL 262 (H) 07/25/2022 0942   TRIG 180 (H) 07/25/2022 0942   HDL 64 07/25/2022 0942   CHOLHDL 4.1 07/25/2022 0942   LDLCALC 165 (H) 07/25/2022 0942   LABVLDL 33 07/25/2022 0942    No components found for: "NTPROBNP" No results for input(s): "PROBNP" in the last 8760 hours. No results for input(s): "TSH" in the last 8760 hours.  BMP Recent Labs    11/21/21 0956 12/25/21 0927 06/18/22 1358  NA 136 135 141  K 3.8 4.6 4.3  CL 102 98 101  CO2 23  --  23  GLUCOSE 308* 328* 179*  BUN _2 CREATININE 0.81 0.95 0.90  CALCIUM 9.1 10.1 10.1  GFRNONAA >60  --   --     HEMOGLOBIN A1C Lab Results  Component Value Date   HGBA1C 7.1 (A) 06/18/2022   HGBA1C 7.1 06/18/2022   HGBA1C 7.1 (A) 06/18/2022   HGBA1C 7.1 (A) 06/18/2022    IMPRESSION:    ICD-10-CM   1. Pre-op evaluation  Z01.818 EKG 12-Lead    PCV ECHOCARDIOGRAM COMPLETE    PCV CARDIAC STRESS TEST    2. Dyspnea on exertion  R06.09 PCV ECHOCARDIOGRAM COMPLETE    PCV CARDIAC STRESS TEST    3. Essential hypertension  I10     4. Hyperlipidemia, unspecified hyperlipidemia type  E78.5     5. Type 2 diabetes mellitus with hyperglycemia, with long-term current use of insulin (HCC)  E11.65 PCV ECHOCARDIOGRAM COMPLETE   Z79.4 PCV CARDIAC STRESS TEST    6. Family history of premature CAD  Z82.49     7. Former smoker  Z87.891        RECOMMENDATIONS: OWIN VIGNOLA is a 51 y.o. African-American male whose past medical history and cardiac risk factors include: Hypertension, insulin-dependent  diabetes mellitus type 2, GERD, family history of premature CAD.  Pre-op evaluation Denies angina pectoris or heart failure symptoms. EKG: Nonischemic Functional capacity and known Cardiovascular risk factors including  insulin-dependent diabetes mellitus type 2 and family history of premature CAD.  Shared decision was to proceed with echocardiogram and exercise treadmill stress test to evaluate for structural heart disease and exercise-induced ischemia respectively. Further recommendations to follow  Dyspnea on exertion Chronic and stable. Patient has been attributing his symptoms to summer weather and nasal congestion.. Clinically euvolemic Ischemic work-up as outlined above  Essential hypertension Within acceptable limits. Medications reconciled. Currently managed by primary care provider.  Hyperlipidemia, unspecified hyperlipidemia type LDL not well controlled. Patient states that he has been prescribed statin therapy and will pick up the prescription. Currently managed by primary care provider. Given his diabetes recommend a goal LDL at least less than 70 mg/dL.  Type 2 diabetes mellitus with hyperglycemia, with long-term current use of insulin (HCC) Last hemoglobin A1c 7.1. Reemphasized importance of glycemic control. Continue ACE inhibitors, start statin therapy as recommended by PCP.  Data Reviewed: I have independently reviewed external notes provided by the referring provider as part of this office visit.   I have independently reviewed labs, EKG as part of medical decision making. I have ordered the following tests:  Orders Placed This Encounter  Procedures   PCV CARDIAC STRESS TEST    Standing Status:   Future    Standing Expiration Date:   07/28/2023   EKG 12-Lead   PCV ECHOCARDIOGRAM COMPLETE    Standing Status:   Future    Standing Expiration Date:   07/28/2023  I have made no medications changes at today's encounter as noted above.  FINAL MEDICATION LIST END OF ENCOUNTER: No orders of the defined types were placed in this encounter.   Medications Discontinued During This Encounter  Medication Reason   acetaminophen (TYLENOL) 500 MG tablet      Current Outpatient  Medications:    amLODipine (NORVASC) 10 MG tablet, Take 1 tablet by mouth daily., Disp: 90 tablet, Rfl: 1   amoxicillin-clavulanate (AUGMENTIN) 875-125 MG tablet, Take 1 tablet by mouth 2 (two) times daily., Disp: 20 tablet, Rfl: 0   aspirin EC 81 MG tablet, Take 81 mg by mouth daily., Disp: , Rfl:    benzonatate (TESSALON) 100 MG capsule, Take 1 capsule (100 mg total) by mouth 2 (two) times daily as needed for cough., Disp: 20 capsule, Rfl: 0   blood glucose meter kit and supplies KIT, Dispense based on patient and insurance preference. Use up to four times daily as directed., Disp: 1 each, Rfl: 0   Continuous Blood Gluc Sensor (DEXCOM G7 SENSOR) MISC, Use to check glucose continously, Disp: 9 each, Rfl: 3   EPINEPHrine 0.3 mg/0.3 mL IJ SOAJ injection, Inject 0.3 mg into the muscle as needed for anaphylaxis., Disp: 1 each, Rfl: 1   esomeprazole (NEXIUM) 40 MG capsule, Take 1 capsule (40 mg total) by mouth daily as needed (acid reflux)., Disp: 90 capsule, Rfl: 1   ezetimibe (ZETIA) 10 MG tablet, TAKE 1 TABLET BY MOUTH EVERY DAY, Disp: 90 tablet, Rfl: 1   gabapentin (NEURONTIN) 300 MG capsule, Take 1 capsule (300 mg total) by mouth 3 (three) times daily., Disp: 90 capsule, Rfl: 2   LEVEMIR FLEXTOUCH 100 UNIT/ML FlexTouch Pen, Inject 30 Units into the skin 2 (two) times daily., Disp: 45 mL, Rfl: 1   lisinopril (ZESTRIL) 20 MG tablet, TAKE 1 TABLET BY MOUTH EVERY  DAY, Disp: 90 tablet, Rfl: 1   metFORMIN (GLUCOPHAGE) 500 MG tablet, Take 1 tablet by mouth 2 times daily with a meal., Disp: 180 tablet, Rfl: 1   predniSONE (DELTASONE) 20 MG tablet, Take 1 tablet (20 mg total) by mouth daily with breakfast for 5 days., Disp: 5 tablet, Rfl: 0   rosuvastatin (CRESTOR) 20 MG tablet, Take 1 tablet (20 mg total) by mouth daily., Disp: 90 tablet, Rfl: 3   Semaglutide,0.25 or 0.5MG/DOS, (OZEMPIC, 0.25 OR 0.5 MG/DOSE,) 2 MG/3ML SOPN, Inject 0.25 mg into the skin once a week. May increase dosage to 0.5 mg weekly after  4 weeks, Disp: 3 mL, Rfl: 2   sildenafil (VIAGRA) 25 MG tablet, Take 1 tablet (25 mg total) by mouth daily as needed for erectile dysfunction., Disp: 10 tablet, Rfl: 0   VENTOLIN HFA 108 (90 Base) MCG/ACT inhaler, Inhale 2 puffs into the lungs every 6 hours as needed for wheezing or shortness of breath., Disp: 18 g, Rfl: 2  Orders Placed This Encounter  Procedures   PCV CARDIAC STRESS TEST   EKG 12-Lead   PCV ECHOCARDIOGRAM COMPLETE    There are no Patient Instructions on file for this visit.   --Continue cardiac medications as reconciled in final medication list. --Return in about 3 months (around 10/26/2022) for post-op, Review test results. or sooner if needed. --Continue follow-up with your primary care physician regarding the management of your other chronic comorbid conditions.  Patient's questions and concerns were addressed to his satisfaction. He voices understanding of the instructions provided during this encounter.   This note was created using a voice recognition software as a result there may be grammatical errors inadvertently enclosed that do not reflect the nature of this encounter. Every attempt is made to correct such errors.  Rex Kras, [place], [facility]  Pager: [phone] Office: [phone]

## 2022-07-30 ENCOUNTER — Ambulatory Visit: Payer: Medicaid Other | Admitting: Internal Medicine

## 2022-08-01 ENCOUNTER — Telehealth: Payer: Self-pay | Admitting: Pharmacist

## 2022-08-01 ENCOUNTER — Other Ambulatory Visit: Payer: Medicaid Other | Admitting: Pharmacist

## 2022-08-01 NOTE — Progress Notes (Signed)
Attempted to contact patient for scheduled appointment for medication management. Left HIPAA compliant message for patient to return my call at their convenience.   Catie T. Gevorg Brum, PharmD, BCACP [facility] [phone]  

## 2022-08-08 NOTE — Progress Notes (Signed)
Attempted to contact patient to reschedule appointment for medication management. Left HIPAA compliant message for patient to return my call at their convenience.   

## 2022-08-31 ENCOUNTER — Other Ambulatory Visit: Payer: Medicaid Other

## 2022-09-19 ENCOUNTER — Ambulatory Visit: Payer: Medicaid Other | Admitting: Nurse Practitioner

## 2022-09-19 ENCOUNTER — Encounter: Payer: Self-pay | Admitting: Nurse Practitioner

## 2022-09-19 VITALS — BP 161/96 | HR 87 | Temp 98.0°F | Ht 76.0 in | Wt 268.8 lb

## 2022-09-19 DIAGNOSIS — M2550 Pain in unspecified joint: Secondary | ICD-10-CM | POA: Diagnosis not present

## 2022-09-19 DIAGNOSIS — N529 Male erectile dysfunction, unspecified: Secondary | ICD-10-CM

## 2022-09-19 DIAGNOSIS — E785 Hyperlipidemia, unspecified: Secondary | ICD-10-CM

## 2022-09-19 DIAGNOSIS — M1711 Unilateral primary osteoarthritis, right knee: Secondary | ICD-10-CM | POA: Diagnosis not present

## 2022-09-19 DIAGNOSIS — E1165 Type 2 diabetes mellitus with hyperglycemia: Secondary | ICD-10-CM

## 2022-09-19 LAB — POCT GLYCOSYLATED HEMOGLOBIN (HGB A1C): HbA1c, POC (controlled diabetic range): 7 % (ref 0.0–7.0)

## 2022-09-19 MED ORDER — KETOROLAC TROMETHAMINE 30 MG/ML IJ SOLN
30.0000 mg | Freq: Once | INTRAMUSCULAR | Status: AC
  Administered 2022-09-19: 30 mg via INTRAMUSCULAR

## 2022-09-19 MED ORDER — ESOMEPRAZOLE MAGNESIUM 40 MG PO CPDR: 40.0000 mg | DELAYED_RELEASE_CAPSULE | Freq: Every day | ORAL | 0 refills | Status: DC | PRN

## 2022-09-19 MED ORDER — LISINOPRIL 20 MG PO TABS: 20.0000 mg | ORAL_TABLET | Freq: Every day | ORAL | 1 refills | Status: DC

## 2022-09-19 MED ORDER — ROSUVASTATIN CALCIUM 20 MG PO TABS: 20.0000 mg | ORAL_TABLET | Freq: Every day | ORAL | 3 refills | Status: DC

## 2022-09-19 MED ORDER — DICLOFENAC SODIUM 50 MG PO TBEC: 50.0000 mg | DELAYED_RELEASE_TABLET | Freq: Two times a day (BID) | ORAL | 2 refills | Status: DC

## 2022-09-19 MED ORDER — LEVEMIR FLEXTOUCH 100 UNIT/ML ~~LOC~~ SOPN: 30.0000 [IU] | PEN_INJECTOR | Freq: Two times a day (BID) | SUBCUTANEOUS | 1 refills | Status: DC

## 2022-09-19 MED ORDER — OZEMPIC (0.25 OR 0.5 MG/DOSE) 2 MG/3ML ~~LOC~~ SOPN: 0.2500 mg | PEN_INJECTOR | SUBCUTANEOUS | 2 refills | Status: DC

## 2022-09-19 MED ORDER — AMLODIPINE BESYLATE 10 MG PO TABS
10.0000 mg | ORAL_TABLET | Freq: Every day | ORAL | 1 refills | Status: DC
Start: 2022-09-19 — End: 2022-12-21

## 2022-09-19 MED ORDER — SILDENAFIL CITRATE 25 MG PO TABS: 25.0000 mg | ORAL_TABLET | Freq: Every day | ORAL | 0 refills | Status: AC | PRN

## 2022-09-19 MED ORDER — EZETIMIBE 10 MG PO TABS: 10.0000 mg | ORAL_TABLET | Freq: Every day | ORAL | 1 refills | Status: DC

## 2022-09-19 MED ORDER — GABAPENTIN 300 MG PO CAPS: 300.0000 mg | ORAL_CAPSULE | Freq: Three times a day (TID) | ORAL | 2 refills | Status: DC

## 2022-09-19 MED ORDER — METFORMIN HCL 500 MG PO TABS: 500.0000 mg | ORAL_TABLET | Freq: Two times a day (BID) | ORAL | 1 refills | Status: DC

## 2022-09-19 NOTE — Progress Notes (Signed)
_0  ID: Jeffery Houston, male    DOB: 02/16/1971, 51 y.o.   MRN: [id number]  Chief Complaint  Patient presents with   Diabetes    Referring provider: [name], NP   HPI  51 year old male past medical history of Arthritis, Diabetes mellitus without complication ([place]), Exertional chest pain (01/23/2019), Exertional dyspnea (01/23/2019), and Hypertension.   Patient presents today for follow-up on diabetes and hypertension.  His blood pressure is elevated in office today.  He states that he did take his medication but he took it within the last hour.  He states that he is compliant with diabetic medication.  His A1c in office today is 7.0.  Patient does complain of pain to bilateral shoulders and wrist this today.  He said that this is flared up within the past week.  He thinks this may be why his blood pressure is a little more elevated today as well.  Patient does need refills on medications.  Denies f/c/s, n/v/d, hemoptysis, PND, leg swelling Denies chest pain or edema        Allergies  Allergen Reactions   Latex Hives    Immunization History  Administered Date(s) Administered   Tdap 04/11/2013, 05/26/2019    Past Medical History:  Diagnosis Date   Arthritis    bil knees   Diabetes mellitus without complication (HCC)    Exertional chest pain 01/23/2019   Exertional dyspnea 01/23/2019   Hypertension     Tobacco History: Social History   Tobacco Use  Smoking Status Former   Packs/day: 2.00   Types: Cigarettes   Quit date: 05/14/2010   Years since quitting: 12.3  Smokeless Tobacco Former   Counseling given: Not Answered   Outpatient Encounter Medications as of 09/19/2022  Medication Sig   aspirin EC 81 MG tablet Take 81 mg by mouth daily.   blood glucose meter kit and supplies KIT Dispense based on patient and insurance preference. Use up to four times daily as directed.   Continuous Blood Gluc Sensor (DEXCOM G7 SENSOR) MISC Use to check glucose  continously   diclofenac (VOLTAREN) 50 MG EC tablet Take 1 tablet (50 mg total) by mouth 2 (two) times daily.   EPINEPHrine 0.3 mg/0.3 mL IJ SOAJ injection Inject 0.3 mg into the muscle as needed for anaphylaxis.   VENTOLIN HFA 108 (90 Base) MCG/ACT inhaler Inhale 2 puffs into the lungs every 6 hours as needed for wheezing or shortness of breath.   [DISCONTINUED] amLODipine (NORVASC) 10 MG tablet Take 1 tablet by mouth daily.   [DISCONTINUED] esomeprazole (NEXIUM) 40 MG capsule Take 1 capsule (40 mg total) by mouth daily as needed (acid reflux).   [DISCONTINUED] ezetimibe (ZETIA) 10 MG tablet TAKE 1 TABLET BY MOUTH EVERY DAY   [DISCONTINUED] gabapentin (NEURONTIN) 300 MG capsule Take 1 capsule (300 mg total) by mouth 3 (three) times daily.   [DISCONTINUED] LEVEMIR FLEXTOUCH 100 UNIT/ML FlexTouch Pen Inject 30 Units into the skin 2 (two) times daily.   [DISCONTINUED] lisinopril (ZESTRIL) 20 MG tablet TAKE 1 TABLET BY MOUTH EVERY DAY   [DISCONTINUED] metFORMIN (GLUCOPHAGE) 500 MG tablet Take 1 tablet by mouth 2 times daily with a meal.   [DISCONTINUED] rosuvastatin (CRESTOR) 20 MG tablet Take 1 tablet (20 mg total) by mouth daily.   [DISCONTINUED] Semaglutide,0.25 or 0.5MG/DOS, (OZEMPIC, 0.25 OR 0.5 MG/DOSE,) 2 MG/3ML SOPN Inject 0.25 mg into the skin once a week. May increase dosage to 0.5 mg weekly after 4 weeks   [DISCONTINUED]  sildenafil (VIAGRA) 25 MG tablet Take 1 tablet (25 mg total) by mouth daily as needed for erectile dysfunction.   amLODipine (NORVASC) 10 MG tablet Take 1 tablet (10 mg total) by mouth daily.   amoxicillin-clavulanate (AUGMENTIN) 875-125 MG tablet Take 1 tablet by mouth 2 (two) times daily. (Patient not taking: Reported on 09/19/2022)   benzonatate (TESSALON) 100 MG capsule Take 1 capsule (100 mg total) by mouth 2 (two) times daily as needed for cough. (Patient not taking: Reported on 09/19/2022)   esomeprazole (NEXIUM) 40 MG capsule Take 1 capsule (40 mg total) by mouth daily  as needed (acid reflux).   ezetimibe (ZETIA) 10 MG tablet Take 1 tablet (10 mg total) by mouth daily.   gabapentin (NEURONTIN) 300 MG capsule Take 1 capsule (300 mg total) by mouth 3 (three) times daily.   insulin detemir (LEVEMIR FLEXTOUCH) 100 UNIT/ML FlexPen Inject 30 Units into the skin 2 (two) times daily.   lisinopril (ZESTRIL) 20 MG tablet Take 1 tablet (20 mg total) by mouth daily.   metFORMIN (GLUCOPHAGE) 500 MG tablet Take 1 tablet (500 mg total) by mouth 2 (two) times daily with a meal.   rosuvastatin (CRESTOR) 20 MG tablet Take 1 tablet (20 mg total) by mouth daily.   Semaglutide,0.25 or 0.5MG/DOS, (OZEMPIC, 0.25 OR 0.5 MG/DOSE,) 2 MG/3ML SOPN Inject 0.25 mg into the skin once a week. May increase dosage to 0.5 mg weekly after 4 weeks   sildenafil (VIAGRA) 25 MG tablet Take 1 tablet (25 mg total) by mouth daily as needed for erectile dysfunction.   [EXPIRED] ketorolac (TORADOL) 30 MG/ML injection 30 mg    No facility-administered encounter medications on file as of 09/19/2022.     Review of Systems  Review of Systems  Constitutional: Negative.   HENT: Negative.    Cardiovascular: Negative.   Gastrointestinal: Negative.   Musculoskeletal:  Positive for arthralgias.  Allergic/Immunologic: Negative.   Neurological: Negative.   Psychiatric/Behavioral: Negative.         Physical Exam  BP (!) 161/96   Pulse 87   Temp 98 F (36.7 C) (Oral)   Ht _0  (1.93 m)   Wt 268 lb 12.8 oz (121.9 kg)   SpO2 97%   BMI 32.72 kg/m   Wt Readings from Last 5 Encounters:  09/19/22 268 lb 12.8 oz (121.9 kg)  07/27/22 271 lb 6.4 oz (123.1 kg)  07/25/22 267 lb 9.6 oz (121.4 kg)  07/17/22 273 lb (123.8 kg)  06/18/22 272 lb 12.8 oz (123.7 kg)     Physical Exam Vitals and nursing note reviewed.  Constitutional:      General: He is not in acute distress.    Appearance: He is well-developed.  Cardiovascular:     Rate and Rhythm: Normal rate and regular rhythm.  Pulmonary:      Effort: Pulmonary effort is normal.     Breath sounds: Normal breath sounds.  Skin:    General: Skin is warm and dry.  Neurological:     Mental Status: He is alert and oriented to person, place, and time.      Lab Results:  CBC    Component Value Date/Time   WBC 12.7 (H) 11/21/2021 0956   RBC 4.29 11/21/2021 0956   HGB 13.8 11/21/2021 0956   HGB 13.8 10/17/2020 1507   HCT 41.7 11/21/2021 0956   HCT 40.4 10/17/2020 1507   PLT 369 11/21/2021 0956   PLT 395 10/17/2020 1507   MCV 97.2 11/21/2021 0956  MCV 93 10/17/2020 1507   MCH 32.2 11/21/2021 0956   MCHC 33.1 11/21/2021 0956   RDW 11.5 11/21/2021 0956   RDW 11.5 (L) 10/17/2020 1507   LYMPHSABS 2.0 11/21/2021 0956   LYMPHSABS 2.6 10/17/2020 1507   MONOABS 0.9 11/21/2021 0956   EOSABS 0.1 11/21/2021 0956   EOSABS 0.1 10/17/2020 1507   BASOSABS 0.1 11/21/2021 0956   BASOSABS 0.0 10/17/2020 1507    BMET    Component Value Date/Time   NA 141 06/18/2022 1358   K 4.3 06/18/2022 1358   CL 101 06/18/2022 1358   CO2 23 06/18/2022 1358   GLUCOSE 179 (H) 06/18/2022 1358   GLUCOSE 308 (H) 11/21/2021 0956   BUN 16 06/18/2022 1358   CREATININE 0.90 06/18/2022 1358   CALCIUM 10.1 06/18/2022 1358   GFRNONAA >60 11/21/2021 0956   GFRAA 111 10/17/2020 1507      Assessment & Plan:   Uncontrolled type 2 diabetes mellitus with hyperglycemia (HCC) - POCT glycosylated hemoglobin (Hb A1C) - metFORMIN (GLUCOPHAGE) 500 MG tablet; Take 1 tablet (500 mg total) by mouth 2 (two) times daily with a meal.  Dispense: 180 tablet; Refill: 1 - insulin detemir (LEVEMIR FLEXTOUCH) 100 UNIT/ML FlexPen; Inject 30 Units into the skin 2 (two) times daily.  Dispense: 45 mL; Refill: 1 - Semaglutide,0.25 or 0.5MG/DOS, (OZEMPIC, 0.25 OR 0.5 MG/DOSE,) 2 MG/3ML SOPN; Inject 0.25 mg into the skin once a week. May increase dosage to 0.5 mg weekly after 4 weeks  Dispense: 3 mL; Refill: 2  2. Arthralgia, unspecified joint  - diclofenac (VOLTAREN) 50 MG  EC tablet; Take 1 tablet (50 mg total) by mouth 2 (two) times daily.  Dispense: 60 tablet; Refill: 2 - ketorolac (TORADOL) 30 MG/ML injection 30 mg - gabapentin (NEURONTIN) 300 MG capsule; Take 1 capsule (300 mg total) by mouth 3 (three) times daily.  Dispense: 90 capsule; Refill: 2  3. Primary osteoarthritis of right knee  - gabapentin (NEURONTIN) 300 MG capsule; Take 1 capsule (300 mg total) by mouth 3 (three) times daily.  Dispense: 90 capsule; Refill: 2  4. Hyperlipidemia, unspecified hyperlipidemia type  - rosuvastatin (CRESTOR) 20 MG tablet; Take 1 tablet (20 mg total) by mouth daily.  Dispense: 90 tablet; Refill: 3  5. Erectile dysfunction, unspecified erectile dysfunction type  - sildenafil (VIAGRA) 25 MG tablet; Take 1 tablet (25 mg total) by mouth daily as needed for erectile dysfunction.  Dispense: 10 tablet; Refill: 0   Follow up:  Follow up In 3 months or sooner if needed     [name], NP 09/19/2022

## 2022-09-19 NOTE — Assessment & Plan Note (Signed)
-   POCT glycosylated hemoglobin (Hb A1C) - metFORMIN (GLUCOPHAGE) 500 MG tablet; Take 1 tablet (500 mg total) by mouth 2 (two) times daily with a meal.  Dispense: 180 tablet; Refill: 1 - insulin detemir (LEVEMIR FLEXTOUCH) 100 UNIT/ML FlexPen; Inject 30 Units into the skin 2 (two) times daily.  Dispense: 45 mL; Refill: 1 - Semaglutide,0.25 or 0.5MG /DOS, (OZEMPIC, 0.25 OR 0.5 MG/DOSE,) 2 MG/3ML SOPN; Inject 0.25 mg into the skin once a week. May increase dosage to 0.5 mg weekly after 4 weeks  Dispense: 3 mL; Refill: 2  2. Arthralgia, unspecified joint  - diclofenac (VOLTAREN) 50 MG EC tablet; Take 1 tablet (50 mg total) by mouth 2 (two) times daily.  Dispense: 60 tablet; Refill: 2 - ketorolac (TORADOL) 30 MG/ML injection 30 mg - gabapentin (NEURONTIN) 300 MG capsule; Take 1 capsule (300 mg total) by mouth 3 (three) times daily.  Dispense: 90 capsule; Refill: 2  3. Primary osteoarthritis of right knee  - gabapentin (NEURONTIN) 300 MG capsule; Take 1 capsule (300 mg total) by mouth 3 (three) times daily.  Dispense: 90 capsule; Refill: 2  4. Hyperlipidemia, unspecified hyperlipidemia type  - rosuvastatin (CRESTOR) 20 MG tablet; Take 1 tablet (20 mg total) by mouth daily.  Dispense: 90 tablet; Refill: 3  5. Erectile dysfunction, unspecified erectile dysfunction type  - sildenafil (VIAGRA) 25 MG tablet; Take 1 tablet (25 mg total) by mouth daily as needed for erectile dysfunction.  Dispense: 10 tablet; Refill: 0   Follow up:  Follow up In 3 months or sooner if needed

## 2022-09-19 NOTE — Patient Instructions (Addendum)
1. Uncontrolled type 2 diabetes mellitus with hyperglycemia (HCC)  - POCT glycosylated hemoglobin (Hb A1C) - metFORMIN (GLUCOPHAGE) 500 MG tablet; Take 1 tablet (500 mg total) by mouth 2 (two) times daily with a meal.  Dispense: 180 tablet; Refill: 1 - insulin detemir (LEVEMIR FLEXTOUCH) 100 UNIT/ML FlexPen; Inject 30 Units into the skin 2 (two) times daily.  Dispense: 45 mL; Refill: 1 - Semaglutide,0.25 or 0.5MG /DOS, (OZEMPIC, 0.25 OR 0.5 MG/DOSE,) 2 MG/3ML SOPN; Inject 0.25 mg into the skin once a week. May increase dosage to 0.5 mg weekly after 4 weeks  Dispense: 3 mL; Refill: 2  2. Arthralgia, unspecified joint  - diclofenac (VOLTAREN) 50 MG EC tablet; Take 1 tablet (50 mg total) by mouth 2 (two) times daily.  Dispense: 60 tablet; Refill: 2 - ketorolac (TORADOL) 30 MG/ML injection 30 mg - gabapentin (NEURONTIN) 300 MG capsule; Take 1 capsule (300 mg total) by mouth 3 (three) times daily.  Dispense: 90 capsule; Refill: 2  3. Primary osteoarthritis of right knee  - gabapentin (NEURONTIN) 300 MG capsule; Take 1 capsule (300 mg total) by mouth 3 (three) times daily.  Dispense: 90 capsule; Refill: 2  4. Hyperlipidemia, unspecified hyperlipidemia type  - rosuvastatin (CRESTOR) 20 MG tablet; Take 1 tablet (20 mg total) by mouth daily.  Dispense: 90 tablet; Refill: 3  5. Erectile dysfunction, unspecified erectile dysfunction type  - sildenafil (VIAGRA) 25 MG tablet; Take 1 tablet (25 mg total) by mouth daily as needed for erectile dysfunction.  Dispense: 10 tablet; Refill: 0   Follow up:  Follow up In 3 months or sooner if needed

## 2022-09-20 ENCOUNTER — Other Ambulatory Visit: Payer: Self-pay

## 2022-09-25 ENCOUNTER — Telehealth: Payer: Self-pay

## 2022-09-25 ENCOUNTER — Other Ambulatory Visit: Payer: Self-pay

## 2022-09-25 NOTE — Telephone Encounter (Signed)
DICLOFENAC PRIOR AUTH HAS BEEN APPROVED UNTIL 09/25/23

## 2022-10-04 ENCOUNTER — Other Ambulatory Visit: Payer: Self-pay

## 2022-10-17 ENCOUNTER — Telehealth: Payer: Self-pay | Admitting: Pharmacist

## 2022-10-17 NOTE — Progress Notes (Signed)
Received voicemail from patient letting me know he had his DexCom set up. Will collaborate with scheduler to schedule follow up phone call with me in late December/early January for DM/HTN follow up. Cardiology appointment on 12/14  Catie Eppie Gibson, PharmD, BCACP, CPP [facility] [phone]

## 2022-10-31 ENCOUNTER — Ambulatory Visit: Payer: Medicaid Other | Admitting: Cardiology

## 2022-11-09 ENCOUNTER — Ambulatory Visit: Payer: Medicaid Other

## 2022-11-09 DIAGNOSIS — R0609 Other forms of dyspnea: Secondary | ICD-10-CM

## 2022-11-09 DIAGNOSIS — Z794 Long term (current) use of insulin: Secondary | ICD-10-CM

## 2022-11-09 DIAGNOSIS — Z01818 Encounter for other preprocedural examination: Secondary | ICD-10-CM

## 2022-11-15 ENCOUNTER — Encounter: Payer: Self-pay | Admitting: Cardiology

## 2022-11-15 ENCOUNTER — Ambulatory Visit: Payer: Medicaid Other | Admitting: Cardiology

## 2022-11-15 VITALS — BP 144/88 | HR 78 | Resp 18 | Ht 76.5 in | Wt 273.8 lb

## 2022-11-15 DIAGNOSIS — I10 Essential (primary) hypertension: Secondary | ICD-10-CM

## 2022-11-15 DIAGNOSIS — F17290 Nicotine dependence, other tobacco product, uncomplicated: Secondary | ICD-10-CM

## 2022-11-15 DIAGNOSIS — Z01818 Encounter for other preprocedural examination: Secondary | ICD-10-CM

## 2022-11-15 DIAGNOSIS — E7800 Pure hypercholesterolemia, unspecified: Secondary | ICD-10-CM

## 2022-11-15 DIAGNOSIS — Z794 Long term (current) use of insulin: Secondary | ICD-10-CM

## 2022-11-15 DIAGNOSIS — Z8249 Family history of ischemic heart disease and other diseases of the circulatory system: Secondary | ICD-10-CM

## 2022-11-15 MED ORDER — LISINOPRIL 40 MG PO TABS: 40.0000 mg | ORAL_TABLET | Freq: Every morning | ORAL | 0 refills | Status: DC

## 2022-11-15 NOTE — Progress Notes (Signed)
ID:  Jeffery Houston, DOB Mar 24, 1971, MRN [id number]  PCP:  [name], NP  Cardiologist:  Rex Kras, DO, [facility] (established care 07/27/2022) Former Cardiology Providers: Dr. Vernell Leep  Date: 11/15/22 Last Office Visit: 07/27/2022  Chief Complaint  Patient presents with   Follow-up    Preoperative clearance    HPI  Jeffery Houston is a 51 y.o. African-American male who presents to the clinic for evaluation of preop clearance at the request of [name], NP. His past medical history and cardiovascular risk factors include: Hypertension, insulin-dependent diabetes mellitus type 2, GERD, family history of premature CAD  Patient is being scheduled for left knee replacement, date of surgery to be determined, with Dr. Percell Miller.  At last office visit the shared decision was to proceed with echo and GXT for further evaluation/risk stratification.  Results reviewed with him in great detail and noted below for further reference.  Clinically denies anginal discomfort or heart failure symptoms.  Since last office visit he is also stopped statin due to myalgias.  He was on rosuvastatin and states that he was on other statins in the past names of which she cannot recall but will look into.   ALLERGIES: Allergies  Allergen Reactions   Bee Venom Anaphylaxis   Crestor [Rosuvastatin Calcium] Other (See Comments)    Myalgias   Latex Hives    MEDICATION LIST PRIOR TO VISIT: Current Meds  Medication Sig   amLODipine (NORVASC) 10 MG tablet Take 1 tablet (10 mg total) by mouth daily.   aspirin EC 81 MG tablet Take 81 mg by mouth daily.   blood glucose meter kit and supplies KIT Dispense based on patient and insurance preference. Use up to four times daily as directed.   Continuous Blood Gluc Sensor (DEXCOM G7 SENSOR) MISC Use to check glucose continously   diclofenac (VOLTAREN) 50 MG EC tablet Take 1 tablet (50 mg total) by mouth 2 (two) times daily.   EPINEPHrine 0.3 mg/0.3 mL IJ  SOAJ injection Inject 0.3 mg into the muscle as needed for anaphylaxis.   esomeprazole (NEXIUM) 40 MG capsule Take 1 capsule (40 mg total) by mouth daily as needed (acid reflux).   ezetimibe (ZETIA) 10 MG tablet Take 1 tablet (10 mg total) by mouth daily.   gabapentin (NEURONTIN) 300 MG capsule Take 1 capsule (300 mg total) by mouth 3 (three) times daily.   insulin detemir (LEVEMIR FLEXTOUCH) 100 UNIT/ML FlexPen Inject 30 Units into the skin 2 (two) times daily.   metFORMIN (GLUCOPHAGE) 500 MG tablet Take 1 tablet (500 mg total) by mouth 2 (two) times daily with a meal.   Semaglutide,0.25 or 0.5MG/DOS, (OZEMPIC, 0.25 OR 0.5 MG/DOSE,) 2 MG/3ML SOPN Inject 0.25 mg into the skin once a week. May increase dosage to 0.5 mg weekly after 4 weeks   sildenafil (VIAGRA) 25 MG tablet Take 1 tablet (25 mg total) by mouth daily as needed for erectile dysfunction.   VENTOLIN HFA 108 (90 Base) MCG/ACT inhaler Inhale 2 puffs into the lungs every 6 hours as needed for wheezing or shortness of breath.   [DISCONTINUED] lisinopril (ZESTRIL) 20 MG tablet Take 1 tablet (20 mg total) by mouth daily.     PAST MEDICAL HISTORY: Past Medical History:  Diagnosis Date   Arthritis    bil knees   Diabetes mellitus without complication ([place])    Exertional chest pain 01/23/2019   Exertional dyspnea 01/23/2019   Hypertension     PAST SURGICAL HISTORY: Past Surgical  History:  Procedure Laterality Date   FOOT SURGERY     PARTIAL KNEE ARTHROPLASTY Left 07/22/2015   Procedure: LEF PARTIAL KNEE REPLACEMENT ;  Surgeon: Renette Butters, MD;  Location: [place];  Service: Orthopedics;  Laterality: Left;   PARTIAL KNEE ARTHROPLASTY Right 10/05/2016   Procedure: UNICOMPARTMENTAL KNEE;  Surgeon: Renette Butters, MD;  Location: [place];  Service: Orthopedics;  Laterality: Right;  Pre/Post Op femoral nerve block   WRIST SURGERY Right    tendon repair    FAMILY HISTORY: The patient family history  includes Heart attack in his brother.  SOCIAL HISTORY:  The patient  reports that he has been smoking cigars. He has quit using smokeless tobacco. He reports current alcohol use. He reports current drug use. Drug: Marijuana.  REVIEW OF SYSTEMS: Review of Systems  Cardiovascular:  Negative for chest pain, claudication, dyspnea on exertion, irregular heartbeat, leg swelling, near-syncope, orthopnea, palpitations, paroxysmal nocturnal dyspnea and syncope.  Respiratory:  Negative for shortness of breath.   Hematologic/Lymphatic: Negative for bleeding problem.  Musculoskeletal:  Negative for muscle cramps and myalgias.  Neurological:  Negative for dizziness and light-headedness.    PHYSICAL EXAM:    11/15/2022   11:54 AM 09/19/2022   11:04 AM 07/27/2022    1:41 PM  Vitals with BMI  Height 6' 4.5" _0  _1   Weight 273 lbs 13 oz 268 lbs 13 oz 271 lbs 6 oz  BMI 32.9 93.26 71.24  Systolic 580 998 338  Diastolic 88 96 81  Pulse 78 87 97    Physical Exam  Constitutional: No distress.  Age appropriate, hemodynamically stable.   Neck: No JVD present.  Cardiovascular: Normal rate, regular rhythm, S1 normal, S2 normal, intact distal pulses and normal pulses. Exam reveals no gallop, no S3 and no S4.  No murmur heard. Pulmonary/Chest: Effort normal and breath sounds normal. No stridor. He has no wheezes. He has no rales.  Abdominal: Soft. Bowel sounds are normal. He exhibits no distension. There is no abdominal tenderness.  Musculoskeletal:        General: No edema.     Cervical back: Neck supple.  Neurological: He is alert and oriented to person, place, and time. He has intact cranial nerves (2-12).  Skin: Skin is warm and moist.   CARDIAC DATABASE: EKG: 11/15/2022: Normal sinus rhythm, 75 bpm, left axis, without underlying ischemia or injury pattern.  Echocardiogram: 11/09/2022: Hyperdynamic LV systolic function with visual EF >70%. Left ventricle cavity is normal in size. Mild left  ventricular hypertrophy. Normal global wall motion. Normal diastolic filling pattern, normal LAP.  No significant valvular heart disease.  Compared to 07/17/2018 G1DD is now normal otherwise no significant change.    Stress Testing: Exercise treadmill stress test 11/09/2022: Functional status: Fair. Chest pain: No Reason for stopping exercise: Maximal exercise capacity Hypertensive response to exercise: Yes (rest 170/90, peak 280/110) Exercise time 6 minutes 47 seconds on Bruce protocol, achieved 8.26 METS, 86% of age-predicted maximum heart rate.  Stress ECG negative for ischemia.  Low risk study, at the current workload.    Heart Catheterization: None  LABORATORY DATA:    Latest Ref Rng & Units 11/21/2021    9:56 AM 07/13/2021    9:41 PM 10/17/2020    3:07 PM  CBC  WBC 4.0 - 10.5 K/uL 12.7  10.1  7.9   Hemoglobin 13.0 - 17.0 g/dL 13.8  14.0  13.8   Hematocrit 39.0 - 52.0 % 41.7  42.7  40.4   Platelets 150 - 400 K/uL 369  364  395        Latest Ref Rng & Units 06/18/2022    1:58 PM 12/25/2021    9:27 AM 11/21/2021    9:56 AM  CMP  Glucose 70 - 99 mg/dL 179  328  308   BUN 6 - 24 mg/dL _0 Creatinine 0.76 - 1.27 mg/dL 0.90  0.95  0.81   Sodium 134 - 144 mmol/L 141  135  136   Potassium 3.5 - 5.2 mmol/L 4.3  4.6  3.8   Chloride 96 - 106 mmol/L 101  98  102   CO2 20 - 29 mmol/L 23   23   Calcium 8.7 - 10.2 mg/dL 10.1  10.1  9.1   Total Protein 6.0 - 8.5 g/dL 7.4  7.9  7.7   Total Bilirubin 0.0 - 1.2 mg/dL 0.6  0.4  1.2   Alkaline Phos 44 - 121 IU/L 97  124  89   AST 0 - 40 IU/L _1 ALT 0 - 44 IU/L 23   25     Lipid Panel     Component Value Date/Time   CHOL 262 (H) 07/25/2022 0942   TRIG 180 (H) 07/25/2022 0942   HDL 64 07/25/2022 0942   CHOLHDL 4.1 07/25/2022 0942   LDLCALC 165 (H) 07/25/2022 0942   LABVLDL 33 07/25/2022 0942    No components found for: "NTPROBNP" No results for input(s): "PROBNP" in the last 8760 hours. No results for  input(s): "TSH" in the last 8760 hours.  BMP Recent Labs    11/21/21 0956 12/25/21 0927 06/18/22 1358  NA 136 135 141  K 3.8 4.6 4.3  CL 102 98 101  CO2 23  --  23  GLUCOSE 308* 328* 179*  BUN _2 CREATININE 0.81 0.95 0.90  CALCIUM 9.1 10.1 10.1  GFRNONAA >60  --   --     HEMOGLOBIN A1C Lab Results  Component Value Date   HGBA1C 7.0 09/19/2022    IMPRESSION:    ICD-10-CM   1. Dyspnea on exertion  R06.09 EKG 12-Lead    lisinopril (ZESTRIL) 40 MG tablet    2. Essential hypertension  I10 EKG 12-Lead    lisinopril (ZESTRIL) 40 MG tablet    Basic metabolic panel    3. Pure hypercholesterolemia  E78.00 CT CARDIAC SCORING ([facility] LOCATIONS ONLY)    4. Type 2 diabetes mellitus with hyperglycemia, with long-term current use of insulin (HCC)  E11.65 lisinopril (ZESTRIL) 40 MG tablet   L93.7 Basic metabolic panel    5. Family history of premature CAD  Z82.49     6. Cigar smoker  F17.290        RECOMMENDATIONS: MAYS PAINO is a 51 y.o. African-American male whose past medical history and cardiac risk factors include: Hypertension, insulin-dependent diabetes mellitus type 2, GERD, family history of premature CAD.  Pre-op evaluation Denies angina pectoris or heart failure symptoms. EKG: Nonischemic Echo: Hyperdynamic LVEF, normal diastolic function, no significant valvular heart disease. GXT: Stress ECG negative for ischemia, hypertensive response to exercise. Patient is considered acceptable risk for upcoming noncardiac surgery. No additional testing warranted at this time. Reemphasized the importance of blood pressure management and improving his modifiable cardiovascular risk factors long-term.  Essential hypertension Office blood pressures are not well-controlled. Home blood pressure readings unknown. Educated him on checking his blood  pressures at home. Hypertensive response to exercise. We will uptitrate lisinopril to 40 mg p.o. every morning. BMP in 1  week to evaluate kidney function and potassium  Hyperlipidemia, pure LDL not well controlled. Stop rosuvastatin due to myalgias. Patient states that he has been on other statin medications in the past which have also caused myalgias. Will order a coronary calcium score for further risk stratification. We discussed Repatha as an alternative.  Patient would like to have this addressed after the surgery  Type 2 diabetes mellitus with hyperglycemia, with long-term current use of insulin (HCC) Last hemoglobin A1c 7.1. Reemphasized importance of glycemic control. Continue ACE inhibitors, start statin therapy as recommended by PCP.  FINAL MEDICATION LIST END OF ENCOUNTER: Meds ordered this encounter  Medications   lisinopril (ZESTRIL) 40 MG tablet    Sig: Take 1 tablet (40 mg total) by mouth every morning.    Dispense:  90 tablet    Refill:  0    Medications Discontinued During This Encounter  Medication Reason   rosuvastatin (CRESTOR) 20 MG tablet Patient Preference   amoxicillin-clavulanate (AUGMENTIN) 875-125 MG tablet Completed Course   benzonatate (TESSALON) 100 MG capsule Completed Course   lisinopril (ZESTRIL) 20 MG tablet Reorder     Current Outpatient Medications:    amLODipine (NORVASC) 10 MG tablet, Take 1 tablet (10 mg total) by mouth daily., Disp: 90 tablet, Rfl: 1   aspirin EC 81 MG tablet, Take 81 mg by mouth daily., Disp: , Rfl:    blood glucose meter kit and supplies KIT, Dispense based on patient and insurance preference. Use up to four times daily as directed., Disp: 1 each, Rfl: 0   Continuous Blood Gluc Sensor (DEXCOM G7 SENSOR) MISC, Use to check glucose continously, Disp: 9 each, Rfl: 3   diclofenac (VOLTAREN) 50 MG EC tablet, Take 1 tablet (50 mg total) by mouth 2 (two) times daily., Disp: 60 tablet, Rfl: 2   EPINEPHrine 0.3 mg/0.3 mL IJ SOAJ injection, Inject 0.3 mg into the muscle as needed for anaphylaxis., Disp: 1 each, Rfl: 1   esomeprazole (NEXIUM) 40 MG  capsule, Take 1 capsule (40 mg total) by mouth daily as needed (acid reflux)., Disp: 90 capsule, Rfl: 0   ezetimibe (ZETIA) 10 MG tablet, Take 1 tablet (10 mg total) by mouth daily., Disp: 90 tablet, Rfl: 1   gabapentin (NEURONTIN) 300 MG capsule, Take 1 capsule (300 mg total) by mouth 3 (three) times daily., Disp: 90 capsule, Rfl: 2   insulin detemir (LEVEMIR FLEXTOUCH) 100 UNIT/ML FlexPen, Inject 30 Units into the skin 2 (two) times daily., Disp: 45 mL, Rfl: 1   metFORMIN (GLUCOPHAGE) 500 MG tablet, Take 1 tablet (500 mg total) by mouth 2 (two) times daily with a meal., Disp: 180 tablet, Rfl: 1   Semaglutide,0.25 or 0.5MG/DOS, (OZEMPIC, 0.25 OR 0.5 MG/DOSE,) 2 MG/3ML SOPN, Inject 0.25 mg into the skin once a week. May increase dosage to 0.5 mg weekly after 4 weeks, Disp: 3 mL, Rfl: 2   sildenafil (VIAGRA) 25 MG tablet, Take 1 tablet (25 mg total) by mouth daily as needed for erectile dysfunction., Disp: 10 tablet, Rfl: 0   VENTOLIN HFA 108 (90 Base) MCG/ACT inhaler, Inhale 2 puffs into the lungs every 6 hours as needed for wheezing or shortness of breath., Disp: 18 g, Rfl: 2   lisinopril (ZESTRIL) 40 MG tablet, Take 1 tablet (40 mg total) by mouth every morning., Disp: 90 tablet, Rfl: 0  Orders Placed This Encounter  Procedures  CT CARDIAC SCORING ([facility] LOCATIONS ONLY)   Basic metabolic panel   EKG 42-ASTM    There are no Patient Instructions on file for this visit.   --Continue cardiac medications as reconciled in final medication list. --Return in about 8 weeks (around 01/10/2023) for Follow up post -op and HLD. or sooner if needed. --Continue follow-up with your primary care physician regarding the management of your other chronic comorbid conditions.  Patient's questions and concerns were addressed to his satisfaction. He voices understanding of the instructions provided during this encounter.   This note was created using a voice recognition software as a result there may be  grammatical errors inadvertently enclosed that do not reflect the nature of this encounter. Every attempt is made to correct such errors.  Rex Kras, [place], [facility]  Pager: [phone] Office: [phone]

## 2022-11-16 ENCOUNTER — Other Ambulatory Visit: Payer: Medicaid Other | Admitting: Pharmacist

## 2022-11-16 DIAGNOSIS — E1165 Type 2 diabetes mellitus with hyperglycemia: Secondary | ICD-10-CM

## 2022-11-16 MED ORDER — SEMAGLUTIDE (1 MG/DOSE) 4 MG/3ML ~~LOC~~ SOPN: 1.0000 mg | PEN_INJECTOR | SUBCUTANEOUS | 2 refills | Status: DC

## 2022-11-16 MED ORDER — LEVEMIR FLEXTOUCH 100 UNIT/ML ~~LOC~~ SOPN: 10.0000 [IU] | PEN_INJECTOR | Freq: Two times a day (BID) | SUBCUTANEOUS | 1 refills | Status: DC

## 2022-11-16 NOTE — Progress Notes (Signed)
11/16/2022 Name: Jeffery Houston MRN: [id number] DOB: 11-Nov-1971  Chief Complaint  Patient presents with   Medication Management   Hyperlipidemia   Diabetes   Hypertension    Jeffery Houston is a 51 y.o. year old male who presented for a telephone visit.   They were referred to the pharmacist by their PCP for assistance in managing diabetes, hypertension, and hyperlipidemia.   Patient is participating in a Managed Medicaid Plan:  Yes  Subjective:  Care Team: Primary Care Provider: [name], NP ; Next Scheduled Visit: 12/20/22 Cardiologist: Terri Skains; Next Scheduled Visit: 01/08/23  Medication Access/Adherence  Current Pharmacy:  [facility] - [place], [place] - [address] [place] [address] Phone: [phone] Fax: [phone]   Patient reports affordability concerns with their medications: No  Patient reports access/transportation concerns to their pharmacy: No  Patient reports adherence concerns with their medications:  No     Diabetes:  Current medications: metformin 500 mg twice daily, Ozempic 0.5 mg weekly, Levemir 15 units twice daily Medications tried in the past: Lantus - itching  Current glucose readings: fasting:~130s ; lowest has been 84; post prandials 274  Using DexCom CGM, sent link to connect to clinic portal today  Patient denies hypoglycemic s/sx including dizziness, shakiness, sweating.   Discussed upcoming manufacturer discontinuation  Hypertension:  Current medications: lisinopril 40 mg daily, amlodipine 10 mg daily  Patient does not have a validated, automated, upper arm home BP cuff   He plans to purchase.   Hyperlipidemia/ASCVD Risk Reduction  Current lipid lowering medications: ezetimibe 10 mg daily ; Repatha discussed with cardiology yesterday and plan to pursue initiation after upcoming knee replacement Medications tried in the past: previously told me lovastatin, but now tells me the prior statin  was 1 mg - suspect Livalo; rosuvastatin - reports muscle cramps with both;   Antiplatelet regimen: aspirin 81 mg daily   Health Maintenance  Health Maintenance Due  Topic Date Due   COVID-19 Vaccine (1) Never done   FOOT EXAM  Never done   OPHTHALMOLOGY EXAM  Never done   COLONOSCOPY (Pts 45-28yr Insurance coverage will need to be confirmed)  Never done     Objective: Lab Results  Component Value Date   HGBA1C 7.0 09/19/2022    Lab Results  Component Value Date   CREATININE 0.90 06/18/2022   BUN 16 06/18/2022   NA 141 06/18/2022   K 4.3 06/18/2022   CL 101 06/18/2022   CO2 23 06/18/2022    Lab Results  Component Value Date   CHOL 262 (H) 07/25/2022   HDL 64 07/25/2022   LDLCALC 165 (H) 07/25/2022   TRIG 180 (H) 07/25/2022   CHOLHDL 4.1 07/25/2022    Medications Reviewed Today     Reviewed by HOsker Mason RPH-CPP (Pharmacist) on 11/16/22 at 1200  Med List Status: <None>   Medication Order Taking? Sig Documenting Provider Last Dose Status Informant  amLODipine (NORVASC) 10 MG tablet [id number] Take 1 tablet (10 mg total) by mouth daily. NFenton Foy NP  Active   aspirin EC 81 MG tablet [id number] Take 81 mg by mouth daily. [provider]  Active Self  blood glucose meter kit and supplies KIT [id number] Dispense based on patient and insurance preference. Use up to four times daily as directed. KVevelyn Francois NP  Active   Continuous Blood Gluc Sensor (DEXCOM G7 SENSOR) [id number] Use to check glucose continously NNils Pyle  Kriste Basque, NP  Active   diclofenac (VOLTAREN) 50 MG EC tablet [id number]  Take 1 tablet (50 mg total) by mouth 2 (two) times daily. [name], NP  Active   EPINEPHrine 0.3 mg/0.3 mL IJ SOAJ injection [id number]  Inject 0.3 mg into the muscle as needed for anaphylaxis. [name], NP  Active   esomeprazole (NEXIUM) 40 MG capsule [id number]  Take 1 capsule (40 mg total) by mouth daily as needed (acid reflux).  [name], NP  Active   ezetimibe (ZETIA) 10 MG tablet [id number]  Take 1 tablet (10 mg total) by mouth daily. [name], NP  Active   gabapentin (NEURONTIN) 300 MG capsule [id number]  Take 1 capsule (300 mg total) by mouth 3 (three) times daily. [name], NP  Active   insulin detemir (LEVEMIR FLEXTOUCH) 100 UNIT/ML FlexPen [id number] Yes Inject 30 Units into the skin 2 (two) times daily. [name], NP Taking Active            Med Note Jodi Mourning, Pasty Manninen T   Fri Nov 16, 2022 11:16 AM) 15 units twice daily  lisinopril (ZESTRIL) 40 MG tablet [id number]  Take 1 tablet (40 mg total) by mouth every morning. Terri Skains, [place], DO  Active   metFORMIN (GLUCOPHAGE) 500 MG tablet [id number] Yes Take 1 tablet (500 mg total) by mouth 2 (two) times daily with a meal. [name], NP Taking Active   Semaglutide, 1 MG/DOSE, 4 MG/3ML SOPN [id number] Yes Inject 1 mg as directed once a week. [name], NP  Active   Discontinued 11/16/22 1154   sildenafil (VIAGRA) 25 MG tablet [id number]  Take 1 tablet (25 mg total) by mouth daily as needed for erectile dysfunction. [name], NP  Active   VENTOLIN HFA 108 (90 Base) MCG/ACT inhaler [id number]  Inhale 2 puffs into the lungs every 6 hours as needed for wheezing or shortness of breath. Teena Dunk, NP  Active   Med List Note Vevelyn Francois, [place] 10/17/20 1436):                Assessment/Plan:   Diabetes: - Currently uncontrolled - Discussed upcoming discontinuation of Levemir, additionally goal to reduce/eliminate insulin therapy. Discussed increasing metformin vs increasing Ozempic. Patient elects to increase Ozempic. Discussed with PCP, she is in agreement. Order placed. Increase Ozempic to 1 mg weekly. Reduce Levemir to 10 units twice daily. Continue metformin 500 mg twice daily   Hypertension: - Currently uncontrolled but anticipate improvement with lisinopril dose increase - Reviewed long term cardiovascular  and renal outcomes of uncontrolled blood pressure - Reviewed appropriate blood pressure monitoring technique and reviewed goal blood pressure. Recommended to check home blood pressure and heart rate 2-3 times weekly - Recommend to continue current regimen  Hyperlipidemia/ASCVD Risk Reduction: - Currently uncontrolled.  - Reviewed Medicaid coverage criteria. They note needing to have tried and failed both atorvastatin and rosuvastatin, however, would recommend completion of PA with noting specifically that two highly hydrophilic statins were tried and failed due to muscle aches/pains. Hopefully plan would approve PA without requiring trial and failure of atorvastatin as well    Follow Up Plan: phone call in 4 weeks  Catie Hedwig Morton, PharmD, [place], [facility] [phone]

## 2022-11-16 NOTE — Patient Instructions (Signed)
Thaxton,   It was great talking to you today!  Increase Ozempic to 1 mg weekly. Decrease Levemir to 10 units twice daily. Continue metformin 500 mg twice daily.   If not, please consider purchasing a good quality blood pressure machine, such as an Omron-brand upper arm cuff. This can be purchased from any pharmacy, including our [facility] outpatient pharmacies, for ~$28-$30. We recommend a blood pressure cuff that goes around your upper arm, as these are generally the most accurate.   Check your blood pressure twice weekly, and any time you have concerning symptoms like headache, chest pain, dizziness, shortness of breath, or vision changes.   Our goal is less than 130/80.  To appropriately check your blood pressure, make sure you do the following:  1) Avoid caffeine, exercise, or tobacco products for 30 minutes before checking. Empty your bladder. 2) Sit with your back supported in a flat-backed chair. Rest your arm on something flat (arm of the chair, table, etc). 3) Sit still with your feet flat on the floor, resting, for at least 5 minutes.  4) Check your blood pressure. Take 1-2 readings.  5) Write down these readings and bring with you to any provider appointments.  Bring your home blood pressure machine with you to a provider's office for accuracy comparison at least once a year.   Make sure you take your blood pressure medications before you come to any office visit, even if you were asked to fast for labs.  Take care! Catie Eppie Gibson, PharmD, BCACP, CPP [facility] [phone]

## 2022-11-29 ENCOUNTER — Other Ambulatory Visit: Payer: Self-pay | Admitting: Nurse Practitioner

## 2022-11-29 DIAGNOSIS — E1165 Type 2 diabetes mellitus with hyperglycemia: Secondary | ICD-10-CM

## 2022-12-03 ENCOUNTER — Other Ambulatory Visit: Payer: Self-pay | Admitting: Nurse Practitioner

## 2022-12-03 DIAGNOSIS — M2550 Pain in unspecified joint: Secondary | ICD-10-CM

## 2022-12-03 NOTE — H&P (Signed)
KNEE ARTHROPLASTY ADMISSION H&P  Patient ID: Jeffery Houston MRN: [id number] DOB/AGE: 50-Dec-1972 50 y.o.  Chief Complaint: left knee pain.  Planned Procedure Date: 12/25/22 Medical and Pulmonary Clearance by Angus Seller NP   Cardiac Clearance by Dr. Odis Hollingshead   HPI: Jeffery Houston is a 52 y.o. male who presents for evaluation of FAILED LEFT UNICOMPARTMENTAL KNEE PROSTHESIS. The patient has a history of pain and functional disability in the left knee due to arthritis and has failed non-surgical conservative treatments for greater than 12 weeks to include NSAID's and/or analgesics, supervised PT with diminished ADL's post treatment, weight reduction as appropriate, and activity modification.  Onset of symptoms was gradual, starting 1 year ago with gradually worsening course since that time. The patient noted prior procedures on the knee to include  arthroscopy, menisectomy, and unicompartmental arthroplasty on the left knee.  Patient currently rates pain at 10 out of 10 with activity. Patient has night pain, worsening of pain with activity and weight bearing, and pain that interferes with activities of daily living.  Patient has evidence of left knee prosthesis loosening  by imaging studies.  There is no active infection.  Past Medical History:  Diagnosis Date   Arthritis    bil knees   Diabetes mellitus without complication (HCC)    Exertional chest pain 01/23/2019   Exertional dyspnea 01/23/2019   Hypertension    Past Surgical History:  Procedure Laterality Date   FOOT SURGERY     PARTIAL KNEE ARTHROPLASTY Left 07/22/2015   Procedure: LEF PARTIAL KNEE REPLACEMENT ;  Surgeon: Sheral Apley, MD;  Location: [facility];  Service: Orthopedics;  Laterality: Left;   PARTIAL KNEE ARTHROPLASTY Right 10/05/2016   Procedure: UNICOMPARTMENTAL KNEE;  Surgeon: Sheral Apley, MD;  Location: [facility];  Service: Orthopedics;  Laterality: Right;  Pre/Post Op femoral nerve block    WRIST SURGERY Right    tendon repair   Allergies  Allergen Reactions   Bee Venom Anaphylaxis   Crestor [Rosuvastatin Calcium] Other (See Comments)    Myalgias   Latex Hives   Prior to Admission medications   Medication Sig Start Date End Date Taking? Authorizing Provider  amLODipine (NORVASC) 10 MG tablet Take 1 tablet (10 mg total) by mouth daily. 09/19/22   Ivonne Andrew, NP  aspirin EC 81 MG tablet Take 81 mg by mouth daily.    [provider]  blood glucose meter kit and supplies KIT Dispense based on patient and insurance preference. Use up to four times daily as directed. 12/25/21   Barbette Merino, NP  Continuous Blood Gluc Sensor (DEXCOM G7 SENSOR) MISC Use to check glucose continously 07/18/22   Ivonne Andrew, NP  diclofenac (VOLTAREN) 50 MG EC tablet Take 1 tablet (50 mg total) by mouth 2 (two) times daily. 09/19/22 12/18/22  Ivonne Andrew, NP  EPINEPHrine 0.3 mg/0.3 mL IJ SOAJ injection Inject 0.3 mg into the muscle as needed for anaphylaxis. 07/25/22   Ivonne Andrew, NP  esomeprazole (NEXIUM) 40 MG capsule Take 1 capsule (40 mg total) by mouth daily as needed (acid reflux). 09/19/22 12/18/22  Ivonne Andrew, NP  ezetimibe (ZETIA) 10 MG tablet Take 1 tablet (10 mg total) by mouth daily. 09/19/22   Ivonne Andrew, NP  gabapentin (NEURONTIN) 300 MG capsule Take 1 capsule (300 mg total) by mouth 3 (three) times daily. 09/19/22 12/18/22  Ivonne Andrew, NP  insulin detemir (LEVEMIR FLEXTOUCH) 100 UNIT/ML FlexPen Inject  10 Units into the skin 2 (two) times daily. 11/16/22   [name], NP  lisinopril (ZESTRIL) 40 MG tablet Take 1 tablet (40 mg total) by mouth every morning. 11/15/22 02/13/23  Tolia, Sunit, DO  metFORMIN (GLUCOPHAGE) 500 MG tablet Take 1 tablet (500 mg total) by mouth 2 (two) times daily with a meal. 09/19/22   [name], NP  Semaglutide, 1 MG/DOSE, 4 MG/3ML SOPN Inject 1 mg as directed once a week. 11/16/22   [name], NP  sildenafil  (VIAGRA) 25 MG tablet Take 1 tablet (25 mg total) by mouth daily as needed for erectile dysfunction. 09/19/22   [name], NP  VENTOLIN HFA 108 (90 Base) MCG/ACT inhaler Inhale 2 puffs into the lungs every 6 hours as needed for wheezing or shortness of breath. 03/23/22   Teena Dunk, NP   Social History   Socioeconomic History   Marital status: Divorced    Spouse name: Not on file   Number of children: 1   Years of education: Not on file   Highest education level: Not on file  Occupational History   Not on file  Tobacco Use   Smoking status: Some Days    Types: Cigars   Smokeless tobacco: Former   Tobacco comments:    Quit smoking cigarettes in 2011, now smokes cigars socially.  Vaping Use   Vaping Use: Never used  Substance and Sexual Activity   Alcohol use: Yes    Comment: social   Drug use: Yes    Types: Marijuana   Sexual activity: Yes    Birth control/protection: None  Other Topics Concern   Not on file  Social History Narrative   Not on file   Social Determinants of Health   Financial Resource Strain: Not on file  Food Insecurity: Not on file  Transportation Needs: Not on file  Physical Activity: Not on file  Stress: Not on file  Social Connections: Not on file   Family History  Problem Relation Age of Onset   Heart attack Brother     ROS: Currently denies lightheadedness, dizziness, Fever, chills, CP, SOB.   No personal history of DVT, PE, MI, or CVA. No loose teeth or dentures All other systems have been reviewed and were otherwise currently negative with the exception of those mentioned in the HPI and as above.  Objective: Vitals: Ht: 6'4" Wt: 272 lbs Temp: 98.0 BP: 157/89 Pulse: 86 O2 94% on room air.   Physical Exam: General: Alert, NAD.  Antalgic Gait  HEENT: EOMI, Good Neck Extension  Pulm: No increased work of breathing.  Clear B/L A/P w/o crackle or wheeze.  CV: RRR, No m/g/r appreciated  GI: soft, NT, ND. BS x 4 quadrants Neuro:  CN II-XII grossly intact without focal deficit.  Sensation intact distally Skin: No lesions in the area of chief complaint MSK/Surgical Site: + medial JLT. ROM 5-100 but slow. Decreased strength in extension and flexion.  +EHL/FHL.  NVI.  Stable varus and valgus stress.    Imaging Review Plain radiographs demonstrate prosthesis loosening to the femoral and tibial components of the left knee.   The overall alignment isneutral. The bone quality appears to be fair for age and reported activity level.  Preoperative templating of the joint replacement has been completed, documented, and submitted to the Operating Room personnel in order to optimize intra-operative equipment management.  Assessment: FAILED LEFT UNICOMPARTMENTAL KNEE PROSTHESIS Active Problems:   * No active hospital problems. *  Plan: Plan for Procedure(s): TOTAL KNEE REVISION  The patient history, physical exam, clinical judgement of the provider and imaging are consistent with failed unicompartmental knee arthroplasty and conversion to total joint arthroplasty is deemed medically necessary. The treatment options including medical management, injection therapy, and arthroplasty were discussed at length. The risks and benefits of Procedure(s): TOTAL KNEE REVISION were presented and reviewed.  The risks of nonoperative treatment, versus surgical intervention including but not limited to continued pain, aseptic loosening, stiffness, dislocation/subluxation, infection, bleeding, nerve injury, blood clots, cardiopulmonary complications, morbidity, mortality, among others were discussed. The patient verbalizes understanding and wishes to proceed with the plan.  Patient is being admitted for inpatient treatment for surgery, pain control, PT, prophylactic antibiotics, VTE prophylaxis, progressive ambulation, ADL's and discharge planning. He will spend at least 1 night in observation.   Dental prophylaxis discussed and recommended for  2 years postoperatively.  The patient does meet the criteria for TXA which will be used perioperatively.   ASA 81 mg BID will be used postoperatively for DVT prophylaxis in addition to SCDs, and early ambulation. Plan for Tylenol, Diclofenac, Oxycodone for pain.   Robaxin for muscle spasms.   Zofran for nausea and vomiting. Miralax for constipation pevention. Pharmacy- [facility] The patient is planning to be discharged home with OPPT and into the care of his ex-wife Marga Melnick who can be reached at [phone]. Follow up appt 01/09/23 at 4:15pm     Alisa Graff Office [phone] 12/03/2022 4:31 PM

## 2022-12-03 NOTE — Telephone Encounter (Signed)
Caller & Relationship to patient:  MRN #  [id number]   Call Back Number:   Date of Last Office Visit: 11/29/2022     Date of Next Office Visit: 12/20/2022    Medication(s) to be Refilled: Doclofenac  [facility]   ** Please notify patient to allow 48-72 hours to process** **Let patient know to contact pharmacy at the end of the day to make sure medication is ready. ** **If patient has not been seen in a year or longer, book an appointment **Advise to use MyChart for refill requests OR to contact their pharmacy

## 2022-12-11 ENCOUNTER — Other Ambulatory Visit: Payer: Medicaid Other | Admitting: Pharmacist

## 2022-12-11 ENCOUNTER — Encounter: Payer: Self-pay | Admitting: Pharmacist

## 2022-12-11 DIAGNOSIS — E1165 Type 2 diabetes mellitus with hyperglycemia: Secondary | ICD-10-CM

## 2022-12-11 NOTE — Progress Notes (Unsigned)
12/11/2022 Name: Jeffery Houston MRN: [id number] DOB: 10/23/1971  Chief Complaint  Patient presents with   Medication Management   Diabetes   Hypertension    Jeffery Houston is a 52 y.o. year old male who presented for a telephone visit.   They were referred to the pharmacist by their PCP for assistance in managing diabetes and hypertension.   Patient is participating in a Managed Medicaid Plan:  Yes  Subjective:  Care Team: Primary Care Provider: [name], NP ; Next Scheduled Visit: 12/20/22  Medication Access/Adherence  Current Pharmacy:  [facility] - [place], [place] - [address] [place] [address] Phone: [phone] Fax: [phone]   Patient reports affordability concerns with their medications: No  Patient reports access/transportation concerns to their pharmacy: No  Patient reports adherence concerns with their medications:  No     Diabetes:  Current medications: metformin 500 mg twice daily, Ozempic 1 mg weekly, Levemir 10 units twice daily  Current glucose readings: reports majority of readings have been in the ~120s; occasional post prandial readings to 300s, but an episode of hypoglycemia to the 40s one evening. Reports he had the flu last week and wasn't eating normally  Denies significant GI upset since increasing Ozempic  to 1 mg weekly  Patient reports hypoglycemic s/sx including dizziness, shakiness, sweating once.   Hypertension:  Current medications: lisinopril 40 mg daily, amlodipine 10 mg daily  Patient does not have a validated, automated, upper arm home BP cuff; reports his BP was checked at [facility] week before last but he cannot find his paperwork.   Patient denies hypotensive s/sx including dizziness, lightheadedness.   Hyperlipidemia/ASCVD Risk Reduction   Hyperlipidemia/ASCVD Risk Reduction   Current lipid lowering medications: ezetimibe 10 mg daily ; Repatha discussed with cardiology in  December and plan to pursue initiation after upcoming knee replacement Medications tried in the past: previously told me lovastatin, but now tells me the prior statin was 1 mg - suspect Livalo; rosuvastatin - reports muscle cramps with both;    Antiplatelet regimen: aspirin 81 mg daily  Objective:  Lab Results  Component Value Date   HGBA1C 7.0 09/19/2022    Lab Results  Component Value Date   CREATININE 0.90 06/18/2022   BUN 16 06/18/2022   NA 141 06/18/2022   K 4.3 06/18/2022   CL 101 06/18/2022   CO2 23 06/18/2022    Lab Results  Component Value Date   CHOL 262 (H) 07/25/2022   HDL 64 07/25/2022   LDLCALC 165 (H) 07/25/2022   TRIG 180 (H) 07/25/2022   CHOLHDL 4.1 07/25/2022    Medications Reviewed Today     Reviewed by Sharene Butters, CPhT (Pharmacy Technician) on 12/10/22 at [facility] List Status: Complete   Medication Order Taking? Sig Documenting Provider Last Dose Status Informant  amLODipine (NORVASC) 10 MG tablet [id number] Yes Take 1 tablet (10 mg total) by mouth daily.  Patient taking differently: Take 10 mg by mouth every evening.   [name], NP  Active   aspirin EC 81 MG tablet [id number] Yes Take 81 mg by mouth in the morning. [provider]  Active Self  blood glucose meter kit and supplies KIT [id number]  Dispense based on patient and insurance preference. Use up to four times daily as directed. Vevelyn Francois, NP  Active   Continuous Blood Gluc Sensor (DEXCOM G7 SENSOR) [place] [id number]  Use to check glucose continously Nils Pyle,  Kriste Basque, NP  Active   diclofenac (VOLTAREN) 50 MG EC tablet [id number] Yes TAKE 1 TABLET BY MOUTH 2 TIMES DAILY [name], NP  Active   EPINEPHrine 0.3 mg/0.3 mL IJ SOAJ injection [id number] Yes Inject 0.3 mg into the muscle as needed for anaphylaxis. [name], NP  Active   esomeprazole (NEXIUM) 40 MG capsule [id number] Yes Take 1 capsule (40 mg total) by mouth daily as needed (acid reflux).  Patient  taking differently: Take 40 mg by mouth daily before breakfast.   [name], NP  Active   ezetimibe (ZETIA) 10 MG tablet [id number] Yes Take 1 tablet (10 mg total) by mouth daily. [name], NP  Active   gabapentin (NEURONTIN) 300 MG capsule [id number] Yes Take 1 capsule (300 mg total) by mouth 3 (three) times daily.  Patient taking differently: Take 300 mg by mouth 2 (two) times daily.   [name], NP  Active   insulin detemir (LEVEMIR FLEXTOUCH) 100 UNIT/ML FlexPen [id number] Yes Inject 10 Units into the skin 2 (two) times daily. [name], NP  Active   lisinopril (ZESTRIL) 40 MG tablet [id number] Yes Take 1 tablet (40 mg total) by mouth every morning. Terri Skains, Sunit, DO  Active   metFORMIN (GLUCOPHAGE) 500 MG tablet [id number] Yes Take 1 tablet (500 mg total) by mouth 2 (two) times daily with a meal. [name], NP  Active     Discontinued 12/10/22 1401 (Patient Preference)   Semaglutide, 1 MG/DOSE, 4 MG/3ML SOPN [id number] Yes Inject 1 mg as directed once a week.  Patient taking differently: Inject 1 mg as directed every Thursday.   [name], NP  Active   sildenafil (VIAGRA) 25 MG tablet [id number]  Take 1 tablet (25 mg total) by mouth daily as needed for erectile dysfunction. [name], NP  Active   VENTOLIN HFA 108 [phone] Base) MCG/ACT inhaler [id number] Yes Inhale 2 puffs into the lungs every 6 hours as needed for wheezing or shortness of breath. Bo Merino I, NP  Active   Med List Note Vevelyn Francois, [place] 10/17/20 1436):                Assessment/Plan:   Diabetes: - Currently controlled per last A1c but opportunities for regimen optimization - Reviewed goal A1c, goal fasting, and goal 2 hour post prandial glucose - Recommend to discontinue evening dose of Levemir due to overnight hypoglycemia and reduce morning dose to 8 units daily. Continue metformin 500 mg twice daily and Ozempic 1 mg weekly. Discussed that if glucose remains within  target range with this change, can hold Levemir completely. Will discuss with PCP.  - Sent link to connect to clinic DexCom Clarity app - Recommend to check glucose continuously using CGM  Hypertension: - Currently unknown control but anticipate improvement  - Recommend to continue current regimen at this time. Expect BP check to be part of pre-op work up tomorro  Hyperlipidemia/ASCVD Risk Reduction: - Currently uncontrolled.  - Recommend to continue to collaborate with cardiology for PCSK9i access    Follow Up Plan: phone call in ~ 6 weeks  Catie TJodi Mourning, PharmD, [place], [facility] [phone]

## 2022-12-11 NOTE — Patient Instructions (Signed)
DUE TO COVID-19 ONLY TWO VISITORS  (aged 50 and older)  ARE ALLOWED TO COME WITH YOU AND STAY IN THE WAITING ROOM ONLY DURING PRE OP AND PROCEDURE.   **NO VISITORS ARE ALLOWED IN THE SHORT STAY AREA OR RECOVERY ROOM!!**  IF YOU WILL BE ADMITTED INTO THE HOSPITAL YOU ARE ALLOWED ONLY FOUR SUPPORT PEOPLE DURING VISITATION HOURS ONLY (7 AM -8PM)   The support person(s) must pass our screening, gel in and out, and wear a mask at all times, including in the patient's room. Patients must also wear a mask when staff or their support person are in the room. Visitors GUEST BADGE MUST BE WORN VISIBLY  One adult visitor may remain with you overnight and MUST be in the room by 8 P.M.     Your procedure is scheduled on: 12/25/22   Report to [facility] Main Entrance    Report to admitting at : 7:00 AM   Call this number if you have problems the morning of surgery [phone]   Do not eat food :After Midnight.   After Midnight you may have the following liquids until : 6:30 AM DAY OF SURGERY  Water Black Coffee (sugar ok, NO MILK/CREAM OR CREAMERS)  Tea (sugar ok, NO MILK/CREAM OR CREAMERS) regular and decaf                             Plain Jell-O (NO RED)                                           Fruit ices (not with fruit pulp, NO RED)                                     Popsicles (NO RED)                                                                  Juice: apple, WHITE grape, WHITE cranberry Sports drinks like Gatorade (NO RED)                The day of surgery:  Drink ONE (1) Pre-Surgery Clear Ensure or G2 at : 6:30 AM the morning of surgery. Drink in one sitting. Do not sip.  This drink was given to you during your hospital  pre-op appointment visit. Nothing else to drink after completing the  Pre-Surgery Clear Ensure or G2.          If you have questions, please contact your surgeon's office.  Oral Hygiene is also important to reduce your risk of infection.                                     Remember - BRUSH YOUR TEETH THE MORNING OF SURGERY WITH YOUR REGULAR TOOTHPASTE  DENTURES WILL BE REMOVED PRIOR TO SURGERY PLEASE DO NOT APPLY "Poly grip" OR ADHESIVES!!!   Do NOT smoke after Midnight   Take these medicines the morning of  surgery with A SIP OF WATER: gabapentin,amlodipine,esomeprazole.Use inhalers as usual. How to Manage Your Diabetes Before and After Surgery  Why is it important to control my blood sugar before and after surgery? Improving blood sugar levels before and after surgery helps healing and can limit problems. A way of improving blood sugar control is eating a healthy diet by:  Eating less sugar and carbohydrates  Increasing activity/exercise  Talking with your doctor about reaching your blood sugar goals High blood sugars (greater than 180 mg/dL) can raise your risk of infections and slow your recovery, so you will need to focus on controlling your diabetes during the weeks before surgery. Make sure that the doctor who takes care of your diabetes knows about your planned surgery including the date and location.  How do I manage my blood sugar before surgery? Check your blood sugar at least 4 times a day, starting 2 days before surgery, to make sure that the level is not too high or low. Check your blood sugar the morning of your surgery when you wake up and every 2 hours until you get to the Short Stay unit. If your blood sugar is less than 70 mg/dL, you will need to treat for low blood sugar: Do not take insulin. Treat a low blood sugar (less than 70 mg/dL) with  cup of clear juice (cranberry or apple), 4 glucose tablets, OR glucose gel. Recheck blood sugar in 15 minutes after treatment (to make sure it is greater than 70 mg/dL). If your blood sugar is not greater than 70 mg/dL on recheck, call [phone] for further instructions. Report your blood sugar to the short stay nurse when you get to Short Stay.  If you are admitted to the  hospital after surgery: Your blood sugar will be checked by the staff and you will probably be given insulin after surgery (instead of oral diabetes medicines) to make sure you have good blood sugar levels. The goal for blood sugar control after surgery is 80-180 mg/dL.   WHAT DO I DO ABOUT MY DIABETES MEDICATION?  Do not take oral diabetes medicines (pills) the morning of surgery.  THE DAY BEFORE SURGERY, take metformin as usual. Take ONLY half of levemir insulin dose at night time   THE MORNING OF SURGERY, take ONLY half of the levemir insulin dose.DO NOT TAKE ANY ORAL DIABETIC MEDICATIONS DAY OF YOUR SURGERY  DO NOT TAKE THE FOLLOWING 7 DAYS PRIOR TO SURGERY: Ozempic, Wegovy, Rybelsus (Semaglutide), Byetta (exenatide), Bydureon (exenatide ER), Victoza, Saxenda (liraglutide), or Trulicity (dulaglutide) Mounjaro (Tirzepatide) Adlyxin (Lixisenatide), Polyethylene Glycol Loxenatide.                              You may not have any metal on your body including hair pins, jewelry, and body piercing             Do not wear lotions, powders, perfumes/cologne, or deodorant              Men may shave face and neck.   Do not bring valuables to the hospital. [facility] IS NOT             RESPONSIBLE   FOR VALUABLES.   Contacts, glasses, or bridgework may not be worn into surgery.   Bring small overnight bag day of surgery.   DO NOT BRING YOUR HOME MEDICATIONS TO THE HOSPITAL. PHARMACY WILL DISPENSE MEDICATIONS LISTED ON YOUR MEDICATION LIST TO YOU DURING  [place]!    Patients discharged on the day of surgery will not be allowed to drive home.  Someone NEEDS to stay with you for the first 24 hours after anesthesia.   Special Instructions: Bring a copy of your healthcare power of attorney and living will documents         the day of surgery if you haven't scanned them before.              Please read over the following fact sheets you were given: IF YOU HAVE QUESTIONS  ABOUT YOUR PRE-OP INSTRUCTIONS PLEASE CALL [phone]    [facility] - Preparing for Surgery Before surgery, you can play an important role.  Because skin is not sterile, your skin needs to be as free of germs as possible.  You can reduce the number of germs on your skin by washing with CHG (chlorahexidine gluconate) soap before surgery.  CHG is an antiseptic cleaner which kills germs and bonds with the skin to continue killing germs even after washing. Please DO NOT use if you have an allergy to CHG or antibacterial soaps.  If your skin becomes reddened/irritated stop using the CHG and inform your nurse when you arrive at Short Stay. Do not shave (including legs and underarms) for at least 48 hours prior to the first CHG shower.  You may shave your face/neck. Please follow these instructions carefully:  1.  Shower with CHG Soap the night before surgery and the  morning of Surgery.  2.  If you choose to wash your hair, wash your hair first as usual with your  normal  shampoo.  3.  After you shampoo, rinse your hair and body thoroughly to remove the  shampoo.                           4.  Use CHG as you would any other liquid soap.  You can apply chg directly  to the skin and wash                       Gently with a scrungie or clean washcloth.  5.  Apply the CHG Soap to your body ONLY FROM THE NECK DOWN.   Do not use on face/ open                           Wound or open sores. Avoid contact with eyes, ears mouth and genitals (private parts).                       Wash face,  Genitals (private parts) with your normal soap.             6.  Wash thoroughly, paying special attention to the area where your surgery  will be performed.  7.  Thoroughly rinse your body with warm water from the neck down.  8.  DO NOT shower/wash with your normal soap after using and rinsing off  the CHG Soap.                9.  Pat yourself dry with a clean towel.            10.  Wear clean pajamas.            11.  Place  clean sheets on your bed the night of your first  shower and do not  sleep with pets. Day of Surgery : Do not apply any lotions/deodorants the morning of surgery.  Please wear clean clothes to the hospital/surgery center.  FAILURE TO FOLLOW THESE INSTRUCTIONS MAY RESULT IN THE CANCELLATION OF YOUR SURGERY PATIENT SIGNATURE_________________________________  NURSE SIGNATURE__________________________________  ________________________________________________________________________  Jeffery Houston  An incentive spirometer is a tool that can help keep your lungs clear and active. This tool measures how well you are filling your lungs with each breath. Taking long deep breaths may help reverse or decrease the chance of developing breathing (pulmonary) problems (especially infection) following: A long period of time when you are unable to move or be active. BEFORE THE PROCEDURE  If the spirometer includes an indicator to show your best effort, your nurse or respiratory therapist will set it to a desired goal. If possible, sit up straight or lean slightly forward. Try not to slouch. Hold the incentive spirometer in an upright position. INSTRUCTIONS FOR USE  Sit on the edge of your bed if possible, or sit up as far as you can in bed or on a chair. Hold the incentive spirometer in an upright position. Breathe out normally. Place the mouthpiece in your mouth and seal your lips tightly around it. Breathe in slowly and as deeply as possible, raising the piston or the ball toward the top of the column. Hold your breath for 3-5 seconds or for as long as possible. Allow the piston or ball to fall to the bottom of the column. Remove the mouthpiece from your mouth and breathe out normally. Rest for a few seconds and repeat Steps 1 through 7 at least 10 times every 1-2 hours when you are awake. Take your time and take a few normal breaths between deep breaths. The spirometer may include an indicator to  show your best effort. Use the indicator as a goal to work toward during each repetition. After each set of 10 deep breaths, practice coughing to be sure your lungs are clear. If you have an incision (the cut made at the time of surgery), support your incision when coughing by placing a pillow or rolled up towels firmly against it. Once you are able to get out of bed, walk around indoors and cough well. You may stop using the incentive spirometer when instructed by your caregiver.  RISKS AND COMPLICATIONS Take your time so you do not get dizzy or light-headed. If you are in pain, you may need to take or ask for pain medication before doing incentive spirometry. It is harder to take a deep breath if you are having pain. AFTER USE Rest and breathe slowly and easily. It can be helpful to keep track of a log of your progress. Your caregiver can provide you with a simple table to help with this. If you are using the spirometer at home, follow these instructions: [place] IF:  You are having difficultly using the spirometer. You have trouble using the spirometer as often as instructed. Your pain medication is not giving enough relief while using the spirometer. You develop fever of 100.5 F (38.1 C) or higher. SEEK IMMEDIATE MEDICAL CARE IF:  You cough up bloody sputum that had not been present before. You develop fever of 102 F (38.9 C) or greater. You develop worsening pain at or near the incision site. MAKE SURE YOU:  Understand these instructions. Will watch your condition. Will get help right away if you are not doing well or get worse. Document Released:  03/18/2007 Document Revised: 01/28/2012 Document Reviewed: 05/19/2007 Exit[employer] Patient Information 2014 [place], [place].   ________________________________________________________________________

## 2022-12-12 ENCOUNTER — Ambulatory Visit: Payer: Medicaid Other | Admitting: Nurse Practitioner

## 2022-12-12 ENCOUNTER — Other Ambulatory Visit: Payer: Self-pay

## 2022-12-12 ENCOUNTER — Encounter: Payer: Self-pay | Admitting: Nurse Practitioner

## 2022-12-12 ENCOUNTER — Encounter (HOSPITAL_COMMUNITY): Payer: Self-pay

## 2022-12-12 ENCOUNTER — Encounter (HOSPITAL_COMMUNITY)
Admission: RE | Admit: 2022-12-12 | Discharge: 2022-12-12 | Disposition: A | Discharge status: Home / Self Care | Payer: Medicaid Other | Source: Ambulatory Visit | Attending: Orthopedic Surgery | Admitting: Orthopedic Surgery

## 2022-12-12 VITALS — BP 135/83 | HR 77 | Temp 97.6°F | Ht 73.5 in | Wt 261.0 lb

## 2022-12-12 VITALS — BP 130/75 | HR 81 | Temp 97.3°F | Ht 73.5 in

## 2022-12-12 DIAGNOSIS — Z01812 Encounter for preprocedural laboratory examination: Secondary | ICD-10-CM | POA: Insufficient documentation

## 2022-12-12 DIAGNOSIS — E1169 Type 2 diabetes mellitus with other specified complication: Secondary | ICD-10-CM

## 2022-12-12 DIAGNOSIS — I10 Essential (primary) hypertension: Secondary | ICD-10-CM | POA: Insufficient documentation

## 2022-12-12 DIAGNOSIS — E1165 Type 2 diabetes mellitus with hyperglycemia: Secondary | ICD-10-CM | POA: Insufficient documentation

## 2022-12-12 DIAGNOSIS — T466X5A Adverse effect of antihyperlipidemic and antiarteriosclerotic drugs, initial encounter: Secondary | ICD-10-CM | POA: Insufficient documentation

## 2022-12-12 DIAGNOSIS — J0191 Acute recurrent sinusitis, unspecified: Secondary | ICD-10-CM

## 2022-12-12 DIAGNOSIS — Z1211 Encounter for screening for malignant neoplasm of colon: Secondary | ICD-10-CM | POA: Diagnosis not present

## 2022-12-12 DIAGNOSIS — Z789 Other specified health status: Secondary | ICD-10-CM | POA: Insufficient documentation

## 2022-12-12 DIAGNOSIS — Z01818 Encounter for other preprocedural examination: Secondary | ICD-10-CM

## 2022-12-12 LAB — CBC
HCT: 43.3 % (ref 39.0–52.0)
Hemoglobin: 13.8 g/dL (ref 13.0–17.0)
MCH: 30.7 pg (ref 26.0–34.0)
MCHC: 31.9 g/dL (ref 30.0–36.0)
MCV: 96.4 fL (ref 80.0–100.0)
Platelets: 374 10*3/uL (ref 150–400)
RBC: 4.49 MIL/uL (ref 4.22–5.81)
RDW: 11.8 % (ref 11.5–15.5)
WBC: 9.5 10*3/uL (ref 4.0–10.5)
nRBC: 0 % (ref 0.0–0.2)

## 2022-12-12 LAB — BASIC METABOLIC PANEL
Anion gap: 10 (ref 5–15)
BUN: 13 mg/dL (ref 6–20)
CO2: 23 mmol/L (ref 22–32)
Calcium: 8.6 mg/dL — ABNORMAL LOW (ref 8.9–10.3)
Chloride: 107 mmol/L (ref 98–111)
Creatinine, Ser: 0.73 mg/dL (ref 0.61–1.24)
GFR, Estimated: >60 mL/min (ref >60)
Glucose, Bld: 138 mg/dL — ABNORMAL HIGH (ref 70–99)
Potassium: 3.7 mmol/L (ref 3.5–5.1)
Sodium: 140 mmol/L (ref 135–145)

## 2022-12-12 LAB — SURGICAL PCR SCREEN
MRSA, PCR: NEGATIVE
Staphylococcus aureus: NEGATIVE

## 2022-12-12 LAB — HEMOGLOBIN A1C
Hgb A1c MFr Bld: 6.1 % — ABNORMAL HIGH (ref 4.8–5.6)
Mean Plasma Glucose: 128.37 mg/dL

## 2022-12-12 LAB — POC SOFIA 2 FLU + SARS ANTIGEN FIA
Influenza A, POC: NEGATIVE
Influenza B, POC: NEGATIVE
SARS Coronavirus 2 Ag: NEGATIVE

## 2022-12-12 LAB — GLUCOSE, CAPILLARY: Glucose-Capillary: 155 mg/dL — ABNORMAL HIGH (ref 70–99)

## 2022-12-12 MED ORDER — LEVEMIR FLEXTOUCH 100 UNIT/ML ~~LOC~~ SOPN: 8.0000 [IU] | PEN_INJECTOR | Freq: Every day | SUBCUTANEOUS | 1 refills | Status: DC

## 2022-12-12 MED ORDER — AMOXICILLIN-POT CLAVULANATE 875-125 MG PO TABS: 1.0000 | ORAL_TABLET | Freq: Two times a day (BID) | ORAL | 0 refills | Status: DC

## 2022-12-12 MED ORDER — SALINE NASAL SPRAY 0.65 % NA SOLN: 1.0000 | NASAL | 12 refills | Status: AC | PRN

## 2022-12-12 MED ORDER — FLUTICASONE PROPIONATE 50 MCG/ACT NA SUSP: 2.0000 | Freq: Every day | NASAL | 6 refills | Status: AC

## 2022-12-12 NOTE — Assessment & Plan Note (Signed)
Referred to podiatry for diabetic foot exam

## 2022-12-12 NOTE — Patient Instructions (Signed)
1 Acute recurrent sinusitis, unspecified location  - amoxicillin-clavulanate (AUGMENTIN) 875-125 MG tablet; Take 1 tablet by mouth 2 (two) times daily.  Dispense: 20 tablet; Refill: 0 - fluticasone (FLONASE) 50 MCG/ACT nasal spray; Place 2 sprays into both nostrils daily.  Dispense: 16 g; Refill: 6 - sodium chloride (OCEAN) 0.65 % nasal spray; Place 1 spray into the nose as needed for congestion.  Dispense: 30 mL; Refill: 12     It is important that you exercise regularly at least 30 minutes 5 times a week as tolerated  Think about what you will eat, plan ahead. Choose " clean, green, fresh or frozen" over canned, processed or packaged foods which are more sugary, salty and fatty. 70 to 75% of food eaten should be vegetables and fruit. Three meals at set times with snacks allowed between meals, but they must be fruit or vegetables. Aim to eat over a 12 hour period , example 7 am to 7 pm, and STOP after  your last meal of the day. Drink water,generally about 64 ounces per day, no other drink is as healthy. Fruit juice is best enjoyed in a healthy way, by EATING the fruit.  Thanks for choosing [facility] we consider it a privelige to serve you.

## 2022-12-12 NOTE — Progress Notes (Addendum)
For Short Stay: [place] appointment date:  Bowel Prep reminder:   For Anesthesia: PCP - Lazaro Arms: NP Cardiologist - DO: Sunit Tolia: 11/15/22: Clearance  Chest x-ray - 07/17/22 EKG - 11/15/22 Stress Test - 11/09/24 ECHO - 11/09/22 Cardiac Cath -  Pacemaker/ICD device last checked: Pacemaker orders received: Device Rep notified:  Spinal Cord Stimulator:  Sleep Study -  CPAP -   Fasting Blood Sugar - 100's Checks Blood Sugar: Dexco sensor Date and result of last Hgb A1c-10.0: 12/25/21  Last dose of GLP1 agonist- semaglutide last dose: 12/13/22 GLP1 instructions: last dose:  will be 12/13/22  Last dose of SGLT-2 inhibitors-  SGLT-2 instructions:   Blood Thinner Instructions: To hold a week before surgery. Aspirin Instructions: Last Dose:  Activity level: Can go up a flight of stairs and activities of daily living without stopping and without chest pain and/or shortness of breath   Able to exercise without chest pain and/or shortness of breath  Anesthesia review: Hx" DIA,HTN  Patient denies shortness of breath, fever, cough and chest pain at PAT appointment   Patient verbalized understanding of instructions that were given to them at the PAT appointment. Patient was also instructed that they will need to review over the PAT instructions again at home before surgery.

## 2022-12-12 NOTE — Assessment & Plan Note (Signed)
-   amoxicillin-clavulanate (AUGMENTIN) 875-125 MG tablet; Take 1 tablet by mouth 2 (two) times daily.  Dispense: 20 tablet; Refill: 0 - fluticasone (FLONASE) 50 MCG/ACT nasal spray; Place 2 sprays into both nostrils daily.  Dispense: 16 g; Refill: 6 - sodium chloride (OCEAN) 0.65 % nasal spray; Place 1 spray into the nose as needed for congestion.  Dispense: 30 mL; Refill: 12 - POC SOFIA 2 FLU + SARS ANTIGEN FIA  Flu and COVID test negative. Drink at least 64 ounces of water daily to maintain hydration

## 2022-12-12 NOTE — Progress Notes (Addendum)
Acute Office Visit  Subjective:     Patient ID: Jeffery Houston, male    DOB: 30-Jun-1971, 52 y.o.   MRN: [id number]  Chief Complaint  Patient presents with   Sinusitis    Since tuesday    HPI [name] with past medical history of essential hypertension, acute recurrent sinusitis, uncontrolled type 2 diabetes, hyperlipidemia, left knee arthritis presents with complaints of sinus pressure, sneezing,stuffy nose, nasal drainage which dark greenish colored drainage for over a week.  He denies fever, chills, shortness of breath, wheezing, bloody sputum, cough.  States that he has history of sinusitis, has upcoming knee replacement surgery February.  He stated that he has stopped smoking cigarettes about a week ago.     Review of Systems  Constitutional:  Negative for chills, fever, malaise/fatigue and weight loss.  HENT:  Positive for congestion. Negative for ear discharge, ear pain, hearing loss, nosebleeds, sinus pain, sore throat and tinnitus.   Respiratory: Negative.  Negative for cough, hemoptysis, sputum production and shortness of breath.   Cardiovascular: Negative.  Negative for chest pain, palpitations, orthopnea, claudication and leg swelling.  Neurological:  Negative for dizziness, tingling, tremors, sensory change, speech change and headaches.  Psychiatric/Behavioral:  Negative for depression, hallucinations, substance abuse and suicidal ideas. The patient is not nervous/anxious.         Objective:    BP 130/75   Pulse 81   Temp (!) 97.3 F (36.3 C)   Ht 6' 1.5" (1.867 m)   SpO2 98%   BMI 35.63 kg/m    Physical Exam Constitutional:      General: He is not in acute distress.    Appearance: Normal appearance. He is not ill-appearing, toxic-appearing or diaphoretic.  HENT:     Right Ear: Tympanic membrane, ear canal and external ear normal. There is no impacted cerumen.     Left Ear: Tympanic membrane, ear canal and external ear normal. There is no impacted  cerumen.     Nose: Congestion present. No rhinorrhea.     Mouth/Throat:     Mouth: Mucous membranes are moist.     Pharynx: Oropharynx is clear. No oropharyngeal exudate or posterior oropharyngeal erythema.  Eyes:     General: No scleral icterus.       Right eye: No discharge.        Left eye: No discharge.  Cardiovascular:     Rate and Rhythm: Normal rate and regular rhythm.     Pulses: Normal pulses.     Heart sounds: Normal heart sounds. No murmur heard.    No friction rub. No gallop.  Pulmonary:     Effort: Pulmonary effort is normal. No respiratory distress.     Breath sounds: Normal breath sounds. No stridor. No wheezing, rhonchi or rales.  Chest:     Chest wall: No tenderness.  Abdominal:     General: There is no distension.     Palpations: Abdomen is soft.  Neurological:     Mental Status: He is alert and oriented to person, place, and time.     Motor: No weakness.     Coordination: Coordination normal.     Gait: Gait normal.  Psychiatric:        Mood and Affect: Mood normal.        Behavior: Behavior normal.        Thought Content: Thought content normal.        Judgment: Judgment normal.     Results for orders placed  or performed in visit on 12/12/22  POC SOFIA 2 FLU + SARS ANTIGEN FIA  Result Value Ref Range   Influenza A, POC Negative Negative   Influenza B, POC Negative Negative   SARS Coronavirus 2 Ag Negative Negative        Assessment & Plan:   Problem List Items Addressed This Visit       Respiratory   Acute recurrent sinusitis - Primary     - amoxicillin-clavulanate (AUGMENTIN) 875-125 MG tablet; Take 1 tablet by mouth 2 (two) times daily.  Dispense: 20 tablet; Refill: 0 - fluticasone (FLONASE) 50 MCG/ACT nasal spray; Place 2 sprays into both nostrils daily.  Dispense: 16 g; Refill: 6 - sodium chloride (OCEAN) 0.65 % nasal spray; Place 1 spray into the nose as needed for congestion.  Dispense: 30 mL; Refill: 12 - POC SOFIA 2 FLU + SARS ANTIGEN  FIA  Flu and COVID test negative. Drink at least 64 ounces of water daily to maintain hydration      Relevant Medications   amoxicillin-clavulanate (AUGMENTIN) 875-125 MG tablet   fluticasone (FLONASE) 50 MCG/ACT nasal spray   sodium chloride (OCEAN) 0.65 % nasal spray   Other Relevant Orders   POC SOFIA 2 FLU + SARS ANTIGEN FIA (Completed)     Endocrine   Uncontrolled type 2 diabetes mellitus with hyperglycemia (HCC)    Referred to podiatry for diabetic foot exam      Relevant Orders   Ambulatory referral to Podiatry   Other Visit Diagnoses     Screening for colon cancer       Relevant Orders   Cologuard       Meds ordered this encounter  Medications   amoxicillin-clavulanate (AUGMENTIN) 875-125 MG tablet    Sig: Take 1 tablet by mouth 2 (two) times daily.    Dispense:  20 tablet    Refill:  0   fluticasone (FLONASE) 50 MCG/ACT nasal spray    Sig: Place 2 sprays into both nostrils daily.    Dispense:  16 g    Refill:  6   sodium chloride (OCEAN) 0.65 % nasal spray    Sig: Place 1 spray into the nose as needed for congestion.    Dispense:  30 mL    Refill:  12    No follow-ups on file.  Renee Rival, FNP

## 2022-12-14 ENCOUNTER — Encounter: Payer: Self-pay | Admitting: Pharmacist

## 2022-12-17 ENCOUNTER — Encounter: Payer: Self-pay | Admitting: Podiatry

## 2022-12-17 ENCOUNTER — Ambulatory Visit: Payer: Medicaid Other | Admitting: Podiatry

## 2022-12-17 VITALS — BP 145/87 | HR 105

## 2022-12-17 DIAGNOSIS — E1140 Type 2 diabetes mellitus with diabetic neuropathy, unspecified: Secondary | ICD-10-CM | POA: Insufficient documentation

## 2022-12-17 DIAGNOSIS — E1142 Type 2 diabetes mellitus with diabetic polyneuropathy: Secondary | ICD-10-CM

## 2022-12-17 DIAGNOSIS — B351 Tinea unguium: Secondary | ICD-10-CM | POA: Diagnosis not present

## 2022-12-17 DIAGNOSIS — M79675 Pain in left toe(s): Secondary | ICD-10-CM

## 2022-12-17 DIAGNOSIS — E1165 Type 2 diabetes mellitus with hyperglycemia: Secondary | ICD-10-CM

## 2022-12-17 DIAGNOSIS — M79674 Pain in right toe(s): Secondary | ICD-10-CM

## 2022-12-17 NOTE — Progress Notes (Addendum)
This patient presents to the office for diabetic foot exam.  This patient says there is no pain or discomfort in her feet.  No history of infection or drainage.  This patient presents to the office for foot exam due to having a history of diabetes.  Vascular  Dorsalis pedis and posterior tibial pulses are palpable  B/L.  Capillary return  WNL.  Temperature gradient is  WNL.  Skin turgor  WNL  Sensorium  Senn Weinstein monofilament wire  WNL. Normal tactile sensation.  Nail Exam  Patient has normal nails with no evidence of bacterial .  Thick mycotic hallux nails.  Orthopedic  Exam  Muscle tone and muscle strength  WNL.  No limitations of motion feet  B/L.  No crepitus or joint effusion noted.  Foot type is unremarkable and digits show no abnormalities.  Hammer toes  B/L.  Hallux malleus  B/L.  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  Skin  No open lesions.  Normal skin texture and turgor.   Diabetes with no complications  Onychomycosis  B/L.  Diabetic foot exam was performed.  There is no evidence of vascular or neurologic pathology.   Debride nails  B/L.   RTC  prn   Gardiner Barefoot DPM

## 2022-12-20 ENCOUNTER — Other Ambulatory Visit: Payer: Self-pay | Admitting: Nurse Practitioner

## 2022-12-20 ENCOUNTER — Ambulatory Visit: Payer: Self-pay | Admitting: Nurse Practitioner

## 2022-12-20 DIAGNOSIS — E1165 Type 2 diabetes mellitus with hyperglycemia: Secondary | ICD-10-CM

## 2022-12-20 DIAGNOSIS — M1711 Unilateral primary osteoarthritis, right knee: Secondary | ICD-10-CM

## 2022-12-20 DIAGNOSIS — M2550 Pain in unspecified joint: Secondary | ICD-10-CM

## 2022-12-21 ENCOUNTER — Encounter: Payer: Self-pay | Admitting: Nurse Practitioner

## 2022-12-21 ENCOUNTER — Ambulatory Visit (INDEPENDENT_AMBULATORY_CARE_PROVIDER_SITE_OTHER): Payer: Medicaid Other | Admitting: Nurse Practitioner

## 2022-12-21 VITALS — BP 135/83 | HR 90 | Temp 97.7°F | Ht 74.5 in | Wt 267.6 lb

## 2022-12-21 DIAGNOSIS — M2550 Pain in unspecified joint: Secondary | ICD-10-CM | POA: Diagnosis not present

## 2022-12-21 DIAGNOSIS — E1165 Type 2 diabetes mellitus with hyperglycemia: Secondary | ICD-10-CM

## 2022-12-21 DIAGNOSIS — Z13220 Encounter for screening for lipoid disorders: Secondary | ICD-10-CM

## 2022-12-21 NOTE — Addendum Note (Signed)
Addended by: Angus Seller on: 12/21/2022 09:41 AM   Modules accepted: Orders

## 2022-12-21 NOTE — Patient Instructions (Signed)
1. Lipid screening  - Lipid Panel  2. Uncontrolled type 2 diabetes mellitus with hyperglycemia (HCC)  - Microalbumin/Creatinine Ratio, Urine - Basic Metabolic Panel  Follow up:  Follow up in 6 months

## 2022-12-21 NOTE — Assessment & Plan Note (Signed)
-   Lipid Panel  2. Uncontrolled type 2 diabetes mellitus with hyperglycemia (HCC)  - Microalbumin/Creatinine Ratio, Urine - Basic Metabolic Panel  Follow up:  Follow up in 6 months

## 2022-12-21 NOTE — Progress Notes (Signed)
@Patient  ID: Jeffery Houston, male    DOB: July 25, 1971, 52 y.o.   MRN: [id number]  Chief Complaint  Patient presents with   Diabetes    Referring provider: [name], NP   HPI  52 year old male past medical history of Arthritis, Diabetes mellitus without complication ([place]), Exertional chest pain (01/23/2019), Exertional dyspnea (01/23/2019), and Hypertension.    Patient presents today for follow-up on diabetes and hypertension.  His blood pressure is elevated in office today. He states that he is compliant with diabetic medication.  His A1c is 6.1.  Patient complains today of multiple joint pain.  Will check inflammatory markers today.  This issue has been going on for several months now.  If inflammatory markers are negative will refer to PMR.  Denies f/c/s, n/v/d, hemoptysis, PND, leg swelling Denies chest pain or edema   Allergies  Allergen Reactions   Bee Venom Anaphylaxis   Crestor [Rosuvastatin Calcium] Other (See Comments)    Myalgias   Latex Hives    Immunization History  Administered Date(s) Administered   Tdap 04/11/2013, 05/26/2019    Past Medical History:  Diagnosis Date   Arthritis    bil knees   Diabetes mellitus without complication (HCC)    Exertional chest pain 01/23/2019   Exertional dyspnea 01/23/2019   Hypertension     Tobacco History: Social History   Tobacco Use  Smoking Status Former   Types: Cigars   Quit date: 12/04/2022   Years since quitting: 0.0  Smokeless Tobacco Former  Tobacco Comments   Quit smoking cigarettes in 2011, now smokes cigars socially.   Counseling given: Not Answered Tobacco comments: Quit smoking cigarettes in 2011, now smokes cigars socially.   Outpatient Encounter Medications as of 12/21/2022  Medication Sig   amoxicillin-clavulanate (AUGMENTIN) 875-125 MG tablet Take 1 tablet by mouth 2 (two) times daily.   blood glucose meter kit and supplies KIT Dispense based on patient and insurance preference. Use up to four  times daily as directed.   Continuous Blood Gluc Sensor (DEXCOM G7 SENSOR) MISC Use to check glucose continously   EPINEPHrine 0.3 mg/0.3 mL IJ SOAJ injection Inject 0.3 mg into the muscle as needed for anaphylaxis.   esomeprazole (NEXIUM) 40 MG capsule Take 1 capsule (40 mg total) by mouth daily as needed (acid reflux). (Patient taking differently: Take 40 mg by mouth daily before breakfast.)   fluticasone (FLONASE) 50 MCG/ACT nasal spray Place 2 sprays into both nostrils daily.   insulin detemir (LEVEMIR FLEXTOUCH) 100 UNIT/ML FlexPen Inject 8 Units into the skin daily.   lisinopril (ZESTRIL) 40 MG tablet Take 1 tablet (40 mg total) by mouth every morning.   Semaglutide, 1 MG/DOSE, 4 MG/3ML SOPN Inject 1 mg as directed once a week.   sodium chloride (OCEAN) 0.65 % nasal spray Place 1 spray into the nose as needed for congestion.   VENTOLIN HFA 108 (90 Base) MCG/ACT inhaler Inhale 2 puffs into the lungs every 6 hours as needed for wheezing or shortness of breath.   [DISCONTINUED] amLODipine (NORVASC) 10 MG tablet Take 1 tablet (10 mg total) by mouth daily.   [DISCONTINUED] ezetimibe (ZETIA) 10 MG tablet Take 1 tablet (10 mg total) by mouth daily.   [DISCONTINUED] gabapentin (NEURONTIN) 300 MG capsule Take 1 capsule (300 mg total) by mouth 3 (three) times daily. (Patient taking differently: Take 300 mg by mouth 2 (two) times daily.)   [DISCONTINUED] metFORMIN (GLUCOPHAGE) 500 MG tablet Take 1 tablet (500 mg total) by  mouth 2 (two) times daily with a meal.   aspirin EC 81 MG tablet Take 81 mg by mouth in the morning. (Patient not taking: Reported on 12/21/2022)   diclofenac (VOLTAREN) 50 MG EC tablet TAKE 1 TABLET BY MOUTH 2 TIMES DAILY (Patient not taking: Reported on 12/21/2022)   sildenafil (VIAGRA) 25 MG tablet Take 1 tablet (25 mg total) by mouth daily as needed for erectile dysfunction. (Patient not taking: Reported on 12/21/2022)   No facility-administered encounter medications on file as of  12/21/2022.     Review of Systems  Review of Systems  Constitutional: Negative.   HENT: Negative.    Cardiovascular: Negative.   Gastrointestinal: Negative.   Musculoskeletal:  Positive for arthralgias and myalgias.  Allergic/Immunologic: Negative.   Neurological: Negative.   Psychiatric/Behavioral: Negative.         Physical Exam  BP 135/83   Pulse 90   Temp 97.7 F (36.5 C) (Temporal)   Ht 6' 2.5" (1.892 m)   Wt 267 lb 9.6 oz (121.4 kg)   SpO2 96%   BMI 33.90 kg/m   Wt Readings from Last 5 Encounters:  12/21/22 267 lb 9.6 oz (121.4 kg)  12/12/22 261 lb (118.4 kg)  11/15/22 273 lb 12.8 oz (124.2 kg)  09/19/22 268 lb 12.8 oz (121.9 kg)  07/27/22 271 lb 6.4 oz (123.1 kg)     Physical Exam Vitals and nursing note reviewed.  Constitutional:      General: He is not in acute distress.    Appearance: He is well-developed.  Cardiovascular:     Rate and Rhythm: Normal rate and regular rhythm.  Pulmonary:     Effort: Pulmonary effort is normal.     Breath sounds: Normal breath sounds.  Skin:    General: Skin is warm and dry.  Neurological:     Mental Status: He is alert and oriented to person, place, and time.      Lab Results:  CBC    Component Value Date/Time   WBC 9.5 12/12/2022 1136   RBC 4.49 12/12/2022 1136   HGB 13.8 12/12/2022 1136   HGB 13.8 10/17/2020 1507   HCT 43.3 12/12/2022 1136   HCT 40.4 10/17/2020 1507   PLT 374 12/12/2022 1136   PLT 395 10/17/2020 1507   MCV 96.4 12/12/2022 1136   MCV 93 10/17/2020 1507   MCH 30.7 12/12/2022 1136   MCHC 31.9 12/12/2022 1136   RDW 11.8 12/12/2022 1136   RDW 11.5 (L) 10/17/2020 1507   LYMPHSABS 2.0 11/21/2021 0956   LYMPHSABS 2.6 10/17/2020 1507   MONOABS 0.9 11/21/2021 0956   EOSABS 0.1 11/21/2021 0956   EOSABS 0.1 10/17/2020 1507   BASOSABS 0.1 11/21/2021 0956   BASOSABS 0.0 10/17/2020 1507    BMET    Component Value Date/Time   NA 140 12/12/2022 1136   NA 141 06/18/2022 1358   K 3.7  12/12/2022 1136   CL 107 12/12/2022 1136   CO2 23 12/12/2022 1136   GLUCOSE 138 (H) 12/12/2022 1136   BUN 13 12/12/2022 1136   BUN 16 06/18/2022 1358   CREATININE 0.73 12/12/2022 1136   CALCIUM 8.6 (L) 12/12/2022 1136   GFRNONAA >60 12/12/2022 1136   GFRAA 111 10/17/2020 1507       Assessment & Plan:   Lipid screening - Lipid Panel  2. Uncontrolled type 2 diabetes mellitus with hyperglycemia (HCC)  - Microalbumin/Creatinine Ratio, Urine - Basic Metabolic Panel  Follow up:  Follow up in 6 months  [name], NP 12/21/2022

## 2022-12-22 LAB — LIPID PANEL
Chol/HDL Ratio: 4.4 ratio (ref 0.0–5.0)
Cholesterol, Total: 179 mg/dL (ref 100–199)
HDL: 41 mg/dL (ref >39)
LDL Chol Calc (NIH): 116 mg/dL — ABNORMAL HIGH (ref 0–99)
Triglycerides: 122 mg/dL (ref 0–149)
VLDL Cholesterol Cal: 22 mg/dL (ref 5–40)

## 2022-12-22 LAB — SEDIMENTATION RATE: Sed Rate: 10 mm/hr (ref 0–15)

## 2022-12-22 LAB — BASIC METABOLIC PANEL
BUN/Creatinine Ratio: 17 (ref 9–20)
BUN: 15 mg/dL (ref 6–24)
CO2: 23 mmol/L (ref 20–29)
Calcium: 9.5 mg/dL (ref 8.7–10.2)
Chloride: 105 mmol/L (ref 96–106)
Creatinine, Ser: 0.86 mg/dL (ref 0.76–1.27)
Glucose: 146 mg/dL — ABNORMAL HIGH (ref 70–99)
Potassium: 4.5 mmol/L (ref 3.5–5.2)
Sodium: 142 mmol/L (ref 134–144)
eGFR: 106 mL/min/{1.73_m2} (ref >59)

## 2022-12-22 LAB — RHEUMATOID FACTOR: Rheumatoid fact SerPl-aCnc: <10 IU/mL (ref <14.0)

## 2022-12-22 LAB — C-REACTIVE PROTEIN: CRP: <1 mg/L (ref 0–10)

## 2022-12-22 LAB — ANA: Anti Nuclear Antibody (ANA): NEGATIVE

## 2022-12-25 ENCOUNTER — Encounter (HOSPITAL_COMMUNITY)
Admission: RE | Disposition: A | Discharge status: Home / Self Care | Payer: Self-pay | Source: Ambulatory Visit | Attending: Orthopedic Surgery

## 2022-12-25 ENCOUNTER — Encounter (HOSPITAL_COMMUNITY): Payer: Self-pay | Admitting: Orthopedic Surgery

## 2022-12-25 ENCOUNTER — Inpatient Hospital Stay (HOSPITAL_COMMUNITY): Payer: Medicaid Other | Admitting: Anesthesiology

## 2022-12-25 ENCOUNTER — Other Ambulatory Visit: Payer: Self-pay

## 2022-12-25 ENCOUNTER — Inpatient Hospital Stay (HOSPITAL_COMMUNITY)
Admission: RE | Admit: 2022-12-25 | Discharge: 2022-12-26 | DRG: 468 | Disposition: A | Discharge status: Home / Self Care | Payer: Medicaid Other | Source: Ambulatory Visit | Attending: Orthopedic Surgery | Admitting: Orthopedic Surgery

## 2022-12-25 ENCOUNTER — Inpatient Hospital Stay (HOSPITAL_COMMUNITY): Payer: Medicaid Other

## 2022-12-25 DIAGNOSIS — I10 Essential (primary) hypertension: Secondary | ICD-10-CM | POA: Diagnosis present

## 2022-12-25 DIAGNOSIS — T84013A Broken internal left knee prosthesis, initial encounter: Secondary | ICD-10-CM

## 2022-12-25 DIAGNOSIS — E119 Type 2 diabetes mellitus without complications: Secondary | ICD-10-CM | POA: Diagnosis present

## 2022-12-25 DIAGNOSIS — K5900 Constipation, unspecified: Secondary | ICD-10-CM | POA: Diagnosis present

## 2022-12-25 DIAGNOSIS — Z91030 Bee allergy status: Secondary | ICD-10-CM

## 2022-12-25 DIAGNOSIS — T84033A Mechanical loosening of internal left knee prosthetic joint, initial encounter: Principal | ICD-10-CM | POA: Diagnosis present

## 2022-12-25 DIAGNOSIS — Z8249 Family history of ischemic heart disease and other diseases of the circulatory system: Secondary | ICD-10-CM

## 2022-12-25 DIAGNOSIS — Z87891 Personal history of nicotine dependence: Secondary | ICD-10-CM | POA: Diagnosis not present

## 2022-12-25 DIAGNOSIS — Z96652 Presence of left artificial knee joint: Principal | ICD-10-CM

## 2022-12-25 DIAGNOSIS — M1711 Unilateral primary osteoarthritis, right knee: Secondary | ICD-10-CM | POA: Diagnosis present

## 2022-12-25 DIAGNOSIS — Z7984 Long term (current) use of oral hypoglycemic drugs: Secondary | ICD-10-CM | POA: Diagnosis not present

## 2022-12-25 DIAGNOSIS — Z7982 Long term (current) use of aspirin: Secondary | ICD-10-CM | POA: Diagnosis not present

## 2022-12-25 DIAGNOSIS — Z91040 Latex allergy status: Secondary | ICD-10-CM | POA: Diagnosis not present

## 2022-12-25 DIAGNOSIS — Z888 Allergy status to other drugs, medicaments and biological substances status: Secondary | ICD-10-CM

## 2022-12-25 DIAGNOSIS — Y793 Surgical instruments, materials and orthopedic devices (including sutures) associated with adverse incidents: Secondary | ICD-10-CM | POA: Diagnosis present

## 2022-12-25 DIAGNOSIS — Z79899 Other long term (current) drug therapy: Secondary | ICD-10-CM | POA: Diagnosis not present

## 2022-12-25 DIAGNOSIS — Z794 Long term (current) use of insulin: Secondary | ICD-10-CM

## 2022-12-25 HISTORY — PX: TOTAL KNEE REVISION: SHX996

## 2022-12-25 LAB — GLUCOSE, CAPILLARY
Glucose-Capillary: 126 mg/dL — ABNORMAL HIGH (ref 70–99)
Glucose-Capillary: 132 mg/dL — ABNORMAL HIGH (ref 70–99)
Glucose-Capillary: 144 mg/dL — ABNORMAL HIGH (ref 70–99)
Glucose-Capillary: 322 mg/dL — ABNORMAL HIGH (ref 70–99)
Glucose-Capillary: 357 mg/dL — ABNORMAL HIGH (ref 70–99)

## 2022-12-25 LAB — MICROALBUMIN / CREATININE URINE RATIO
Creatinine, Urine: 200.2 mg/dL
Microalb/Creat Ratio: 6 mg/g creat (ref 0–29)
Microalbumin, Urine: 12.3 ug/mL

## 2022-12-25 SURGERY — TOTAL KNEE REVISION
Anesthesia: Spinal | Site: Knee | Laterality: Left

## 2022-12-25 MED ORDER — ALUM & MAG HYDROXIDE-SIMETH 200-200-20 MG/5ML PO SUSP: 30.0000 mL | ORAL | Status: DC | PRN

## 2022-12-25 MED ORDER — FENTANYL CITRATE PF 50 MCG/ML IJ SOSY
50.0000 ug | PREFILLED_SYRINGE | INTRAMUSCULAR | Status: DC
  Administered 2022-12-25: 100 ug via INTRAVENOUS
  Filled 2022-12-25: qty 2

## 2022-12-25 MED ORDER — ASPIRIN 81 MG PO CHEW
81.0000 mg | CHEWABLE_TABLET | Freq: Two times a day (BID) | ORAL | Status: DC
  Administered 2022-12-25 – 2022-12-26 (×2): 81 mg via ORAL
  Filled 2022-12-25 (×2): qty 1

## 2022-12-25 MED ORDER — BUPIVACAINE IN DEXTROSE 0.75-8.25 % IT SOLN
INTRATHECAL | Status: DC | PRN
  Administered 2022-12-25: 15 mg via INTRATHECAL

## 2022-12-25 MED ORDER — CEFAZOLIN SODIUM-DEXTROSE 2-4 GM/100ML-% IV SOLN
2.0000 g | Freq: Four times a day (QID) | INTRAVENOUS | Status: AC
  Administered 2022-12-25 – 2022-12-26 (×2): 2 g via INTRAVENOUS
  Filled 2022-12-25 (×2): qty 100

## 2022-12-25 MED ORDER — MIDAZOLAM HCL 2 MG/2ML IJ SOLN: 0.5000 mg | Freq: Once | INTRAMUSCULAR | Status: DC | PRN

## 2022-12-25 MED ORDER — WATER FOR IRRIGATION, STERILE IR SOLN
Status: DC | PRN
  Administered 2022-12-25: 2000 mL

## 2022-12-25 MED ORDER — ONDANSETRON HCL 4 MG PO TABS: 4.0000 mg | ORAL_TABLET | Freq: Four times a day (QID) | ORAL | Status: DC | PRN

## 2022-12-25 MED ORDER — OXYCODONE HCL 5 MG PO TABS
10.0000 mg | ORAL_TABLET | ORAL | Status: DC | PRN
  Administered 2022-12-25 – 2022-12-26 (×4): 15 mg via ORAL
  Filled 2022-12-25 (×4): qty 3

## 2022-12-25 MED ORDER — TRAMADOL HCL 50 MG PO TABS
50.0000 mg | ORAL_TABLET | Freq: Four times a day (QID) | ORAL | Status: DC
  Administered 2022-12-25 – 2022-12-26 (×4): 50 mg via ORAL
  Filled 2022-12-25 (×4): qty 1

## 2022-12-25 MED ORDER — PROPOFOL 500 MG/50ML IV EMUL
INTRAVENOUS | Status: DC | PRN
  Administered 2022-12-25: 40 ug/kg/min via INTRAVENOUS

## 2022-12-25 MED ORDER — LIDOCAINE 2% (20 MG/ML) 5 ML SYRINGE
INTRAMUSCULAR | Status: DC | PRN
  Administered 2022-12-25: 60 mg via INTRAVENOUS

## 2022-12-25 MED ORDER — HYDROMORPHONE HCL 1 MG/ML IJ SOLN
0.5000 mg | INTRAMUSCULAR | Status: DC | PRN
  Administered 2022-12-26 (×2): 1 mg via INTRAVENOUS
  Filled 2022-12-25 (×2): qty 1

## 2022-12-25 MED ORDER — 0.9 % SODIUM CHLORIDE (POUR BTL) OPTIME
TOPICAL | Status: DC | PRN
  Administered 2022-12-25: 1000 mL

## 2022-12-25 MED ORDER — BUPIVACAINE LIPOSOME 1.3 % IJ SUSP
INTRAMUSCULAR | Status: DC | PRN
  Administered 2022-12-25: 20 mL

## 2022-12-25 MED ORDER — SODIUM CHLORIDE 0.9% FLUSH
INTRAVENOUS | Status: DC | PRN
  Administered 2022-12-25: 30 mL

## 2022-12-25 MED ORDER — CHLORHEXIDINE GLUCONATE 0.12 % MT SOLN
15.0000 mL | Freq: Once | OROMUCOSAL | Status: AC
  Administered 2022-12-25: 15 mL via OROMUCOSAL

## 2022-12-25 MED ORDER — ORAL CARE MOUTH RINSE: 15.0000 mL | Freq: Once | OROMUCOSAL | Status: AC

## 2022-12-25 MED ORDER — INSULIN ASPART 100 UNIT/ML IJ SOLN: 0.0000 [IU] | Freq: Three times a day (TID) | INTRAMUSCULAR | Status: DC

## 2022-12-25 MED ORDER — SODIUM CHLORIDE (PF) 0.9 % IJ SOLN
INTRAMUSCULAR | Status: AC
  Filled 2022-12-25: qty 50

## 2022-12-25 MED ORDER — INSULIN DETEMIR 100 UNIT/ML FLEXPEN: 8.0000 [IU] | PEN_INJECTOR | Freq: Every day | SUBCUTANEOUS | Status: DC

## 2022-12-25 MED ORDER — ONDANSETRON HCL 4 MG/2ML IJ SOLN: 4.0000 mg | Freq: Four times a day (QID) | INTRAMUSCULAR | Status: DC | PRN

## 2022-12-25 MED ORDER — POVIDONE-IODINE 10 % EX SWAB: 2.0000 | Freq: Once | CUTANEOUS | Status: DC

## 2022-12-25 MED ORDER — PANTOPRAZOLE SODIUM 40 MG PO TBEC
40.0000 mg | DELAYED_RELEASE_TABLET | Freq: Every day | ORAL | Status: DC
  Administered 2022-12-25 – 2022-12-26 (×2): 40 mg via ORAL
  Filled 2022-12-25 (×2): qty 1

## 2022-12-25 MED ORDER — OXYCODONE HCL 5 MG/5ML PO SOLN: 5.0000 mg | Freq: Once | ORAL | Status: DC | PRN

## 2022-12-25 MED ORDER — METOCLOPRAMIDE HCL 5 MG/ML IJ SOLN: 5.0000 mg | Freq: Three times a day (TID) | INTRAMUSCULAR | Status: DC | PRN

## 2022-12-25 MED ORDER — PHENOL 1.4 % MT LIQD: 1.0000 | OROMUCOSAL | Status: DC | PRN

## 2022-12-25 MED ORDER — ALBUTEROL SULFATE (2.5 MG/3ML) 0.083% IN NEBU: 2.5000 mg | INHALATION_SOLUTION | Freq: Four times a day (QID) | RESPIRATORY_TRACT | Status: DC | PRN

## 2022-12-25 MED ORDER — MIDAZOLAM HCL 2 MG/2ML IJ SOLN
1.0000 mg | INTRAMUSCULAR | Status: DC
  Administered 2022-12-25 (×2): 2 mg via INTRAVENOUS
  Filled 2022-12-25: qty 2

## 2022-12-25 MED ORDER — TRANEXAMIC ACID-NACL 1000-0.7 MG/100ML-% IV SOLN
1000.0000 mg | Freq: Once | INTRAVENOUS | Status: AC
  Administered 2022-12-25: 1000 mg via INTRAVENOUS
  Filled 2022-12-25: qty 100

## 2022-12-25 MED ORDER — GABAPENTIN 300 MG PO CAPS
300.0000 mg | ORAL_CAPSULE | Freq: Three times a day (TID) | ORAL | Status: DC
  Administered 2022-12-25 – 2022-12-26 (×2): 300 mg via ORAL
  Filled 2022-12-25 (×2): qty 1

## 2022-12-25 MED ORDER — PROPOFOL 1000 MG/100ML IV EMUL
INTRAVENOUS | Status: AC
  Filled 2022-12-25: qty 100

## 2022-12-25 MED ORDER — DEXAMETHASONE SODIUM PHOSPHATE 10 MG/ML IJ SOLN
8.0000 mg | Freq: Once | INTRAMUSCULAR | Status: AC
  Administered 2022-12-25: 8 mg via INTRAVENOUS

## 2022-12-25 MED ORDER — LACTATED RINGERS IV SOLN: INTRAVENOUS | Status: DC

## 2022-12-25 MED ORDER — OXYCODONE HCL 5 MG PO TABS: 5.0000 mg | ORAL_TABLET | Freq: Once | ORAL | Status: DC | PRN

## 2022-12-25 MED ORDER — EZETIMIBE 10 MG PO TABS
10.0000 mg | ORAL_TABLET | Freq: Every day | ORAL | Status: DC
  Administered 2022-12-26: 10 mg via ORAL
  Filled 2022-12-25: qty 1

## 2022-12-25 MED ORDER — INSULIN DETEMIR 100 UNIT/ML ~~LOC~~ SOLN
8.0000 [IU] | Freq: Every day | SUBCUTANEOUS | Status: DC
  Administered 2022-12-26: 8 [IU] via SUBCUTANEOUS
  Filled 2022-12-25: qty 0.08

## 2022-12-25 MED ORDER — DIPHENHYDRAMINE HCL 12.5 MG/5ML PO ELIX: 12.5000 mg | ORAL_SOLUTION | ORAL | Status: DC | PRN

## 2022-12-25 MED ORDER — AMLODIPINE BESYLATE 10 MG PO TABS
10.0000 mg | ORAL_TABLET | Freq: Every day | ORAL | Status: DC
  Administered 2022-12-26: 10 mg via ORAL
  Filled 2022-12-25: qty 1

## 2022-12-25 MED ORDER — BUPIVACAINE LIPOSOME 1.3 % IJ SUSP
INTRAMUSCULAR | Status: AC
  Filled 2022-12-25: qty 20

## 2022-12-25 MED ORDER — METHOCARBAMOL 500 MG IVPB - SIMPLE MED
500.0000 mg | Freq: Four times a day (QID) | INTRAVENOUS | Status: DC | PRN
  Administered 2022-12-25: 500 mg via INTRAVENOUS

## 2022-12-25 MED ORDER — DEXAMETHASONE SODIUM PHOSPHATE 10 MG/ML IJ SOLN
10.0000 mg | Freq: Once | INTRAMUSCULAR | Status: AC
  Administered 2022-12-26: 10 mg via INTRAVENOUS
  Filled 2022-12-25: qty 1

## 2022-12-25 MED ORDER — MEPERIDINE HCL 50 MG/ML IJ SOLN: 6.2500 mg | INTRAMUSCULAR | Status: DC | PRN

## 2022-12-25 MED ORDER — ONDANSETRON HCL 4 MG/2ML IJ SOLN
INTRAMUSCULAR | Status: DC | PRN
  Administered 2022-12-25: 4 mg via INTRAVENOUS

## 2022-12-25 MED ORDER — HYDROMORPHONE HCL 1 MG/ML IJ SOLN: 0.2500 mg | INTRAMUSCULAR | Status: DC | PRN

## 2022-12-25 MED ORDER — BUPIVACAINE LIPOSOME 1.3 % IJ SUSP: 20.0000 mL | Freq: Once | INTRAMUSCULAR | Status: DC

## 2022-12-25 MED ORDER — CEFAZOLIN IN SODIUM CHLORIDE 3-0.9 GM/100ML-% IV SOLN
3.0000 g | INTRAVENOUS | Status: AC
  Administered 2022-12-25: 3 g via INTRAVENOUS
  Filled 2022-12-25: qty 100

## 2022-12-25 MED ORDER — INSULIN ASPART 100 UNIT/ML IJ SOLN
0.0000 [IU] | Freq: Three times a day (TID) | INTRAMUSCULAR | Status: DC
  Administered 2022-12-26 (×2): 5 [IU] via SUBCUTANEOUS

## 2022-12-25 MED ORDER — METFORMIN HCL 500 MG PO TABS
500.0000 mg | ORAL_TABLET | Freq: Two times a day (BID) | ORAL | Status: DC
  Administered 2022-12-26: 500 mg via ORAL
  Filled 2022-12-25: qty 1

## 2022-12-25 MED ORDER — LISINOPRIL 20 MG PO TABS
40.0000 mg | ORAL_TABLET | Freq: Every morning | ORAL | Status: DC
  Administered 2022-12-26: 40 mg via ORAL
  Filled 2022-12-25: qty 2

## 2022-12-25 MED ORDER — BISACODYL 10 MG RE SUPP: 10.0000 mg | Freq: Every day | RECTAL | Status: DC | PRN

## 2022-12-25 MED ORDER — BUPIVACAINE HCL (PF) 0.25 % IJ SOLN
INTRAMUSCULAR | Status: DC | PRN
  Administered 2022-12-25: 30 mL

## 2022-12-25 MED ORDER — METOCLOPRAMIDE HCL 5 MG PO TABS: 5.0000 mg | ORAL_TABLET | Freq: Three times a day (TID) | ORAL | Status: DC | PRN

## 2022-12-25 MED ORDER — ONDANSETRON HCL 4 MG/2ML IJ SOLN
INTRAMUSCULAR | Status: AC
  Filled 2022-12-25: qty 2

## 2022-12-25 MED ORDER — BUPIVACAINE HCL 0.25 % IJ SOLN
INTRAMUSCULAR | Status: AC
  Filled 2022-12-25: qty 1

## 2022-12-25 MED ORDER — LIDOCAINE HCL (PF) 2 % IJ SOLN
INTRAMUSCULAR | Status: AC
  Filled 2022-12-25: qty 5

## 2022-12-25 MED ORDER — POLYETHYLENE GLYCOL 3350 17 G PO PACK: 17.0000 g | PACK | Freq: Every day | ORAL | Status: DC | PRN

## 2022-12-25 MED ORDER — MENTHOL 3 MG MT LOZG: 1.0000 | LOZENGE | OROMUCOSAL | Status: DC | PRN

## 2022-12-25 MED ORDER — MAGNESIUM CITRATE PO SOLN: 1.0000 | Freq: Once | ORAL | Status: DC | PRN

## 2022-12-25 MED ORDER — ACETAMINOPHEN 500 MG PO TABS
1000.0000 mg | ORAL_TABLET | Freq: Four times a day (QID) | ORAL | Status: AC
  Administered 2022-12-25 – 2022-12-26 (×4): 1000 mg via ORAL
  Filled 2022-12-25 (×4): qty 2

## 2022-12-25 MED ORDER — DEXAMETHASONE SODIUM PHOSPHATE 10 MG/ML IJ SOLN
INTRAMUSCULAR | Status: AC
  Filled 2022-12-25: qty 1

## 2022-12-25 MED ORDER — OXYCODONE HCL 5 MG PO TABS: 5.0000 mg | ORAL_TABLET | ORAL | Status: DC | PRN

## 2022-12-25 MED ORDER — TRANEXAMIC ACID-NACL 1000-0.7 MG/100ML-% IV SOLN
1000.0000 mg | INTRAVENOUS | Status: AC
  Administered 2022-12-25: 1000 mg via INTRAVENOUS
  Filled 2022-12-25: qty 100

## 2022-12-25 MED ORDER — DOCUSATE SODIUM 100 MG PO CAPS
100.0000 mg | ORAL_CAPSULE | Freq: Two times a day (BID) | ORAL | Status: DC
  Administered 2022-12-25 – 2022-12-26 (×2): 100 mg via ORAL
  Filled 2022-12-25 (×2): qty 1

## 2022-12-25 MED ORDER — ACETAMINOPHEN 500 MG PO TABS
1000.0000 mg | ORAL_TABLET | Freq: Once | ORAL | Status: AC
  Administered 2022-12-25: 1000 mg via ORAL
  Filled 2022-12-25: qty 2

## 2022-12-25 MED ORDER — MIDAZOLAM HCL 2 MG/2ML IJ SOLN
INTRAMUSCULAR | Status: AC
  Filled 2022-12-25: qty 2

## 2022-12-25 MED ORDER — ROPIVACAINE HCL 7.5 MG/ML IJ SOLN
INTRAMUSCULAR | Status: DC | PRN
  Administered 2022-12-25: 20 mL via PERINEURAL

## 2022-12-25 MED ORDER — INSULIN ASPART 100 UNIT/ML IJ SOLN
0.0000 [IU] | Freq: Every day | INTRAMUSCULAR | Status: DC
  Administered 2022-12-26: 5 [IU] via SUBCUTANEOUS

## 2022-12-25 MED ORDER — SODIUM CHLORIDE 0.9 % IR SOLN
Status: DC | PRN
  Administered 2022-12-25: 1000 mL

## 2022-12-25 MED ORDER — ALBUTEROL SULFATE HFA 108 (90 BASE) MCG/ACT IN AERS: 2.0000 | INHALATION_SPRAY | Freq: Four times a day (QID) | RESPIRATORY_TRACT | Status: DC | PRN

## 2022-12-25 MED ORDER — METHOCARBAMOL 500 MG PO TABS
500.0000 mg | ORAL_TABLET | Freq: Four times a day (QID) | ORAL | Status: DC | PRN
  Administered 2022-12-25 – 2022-12-26 (×2): 500 mg via ORAL
  Filled 2022-12-25 (×2): qty 1

## 2022-12-25 MED ORDER — ACETAMINOPHEN 325 MG PO TABS: 325.0000 mg | ORAL_TABLET | Freq: Four times a day (QID) | ORAL | Status: DC | PRN

## 2022-12-25 MED ORDER — PROMETHAZINE HCL 25 MG/ML IJ SOLN: 6.2500 mg | INTRAMUSCULAR | Status: DC | PRN

## 2022-12-25 MED ORDER — METHOCARBAMOL 500 MG IVPB - SIMPLE MED
INTRAVENOUS | Status: AC
  Filled 2022-12-25: qty 55

## 2022-12-25 SURGICAL SUPPLY — 54 items
BAG COUNTER SPONGE SURGICOUNT (BAG) IMPLANT
BAG SPNG CNTER NS LX DISP (BAG)
BLADE HEX COATED 2.75 (ELECTRODE) ×2 IMPLANT
BLADE SAG 18X100X1.27 (BLADE) ×2 IMPLANT
BLADE SAGITTAL 25.0X1.37X90 (BLADE) ×2 IMPLANT
BLADE SURG 15 STRL LF DISP TIS (BLADE) ×2 IMPLANT
BLADE SURG 15 STRL SS (BLADE)
BLADE SURG SZ10 CARB STEEL (BLADE) ×4 IMPLANT
BNDG CMPR MED 10X6 ELC LF (GAUZE/BANDAGES/DRESSINGS) ×1
BNDG ELASTIC 6X10 VLCR STRL LF (GAUZE/BANDAGES/DRESSINGS) ×2 IMPLANT
BOWL SMART MIX CTS (DISPOSABLE) IMPLANT
BSPLAT TIB 7 KN TRITANIUM (Knees) ×1 IMPLANT
CLSR STERI-STRIP ANTIMIC 1/2X4 (GAUZE/BANDAGES/DRESSINGS) ×2 IMPLANT
COMP FEMORAL TRIATHLON LFT SZ7 (Orthopedic Implant) ×1 IMPLANT
COMPONENT FEMRL TRTHLON LT SZ7 (Orthopedic Implant) IMPLANT
COVER SURGICAL LIGHT HANDLE (MISCELLANEOUS) ×2 IMPLANT
CUFF TOURN SGL QUICK 34 (TOURNIQUET CUFF) ×1
CUFF TRNQT CYL 34X4.125X (TOURNIQUET CUFF) ×2 IMPLANT
DRAPE INCISE IOBAN 66X45 STRL (DRAPES) ×6 IMPLANT
DRAPE U-SHAPE 47X51 STRL (DRAPES) ×2 IMPLANT
DRSG MEPILEX POST OP 4X12 (GAUZE/BANDAGES/DRESSINGS) ×2 IMPLANT
DRSG MEPILEX SACRM 8.7X9.8 (GAUZE/BANDAGES/DRESSINGS) IMPLANT
DURAPREP 26ML APPLICATOR (WOUND CARE) ×4 IMPLANT
GLOVE BIO SURGEON STRL SZ7.5 (GLOVE) ×4 IMPLANT
GLOVE BIOGEL PI IND STRL 7.5 (GLOVE) ×2 IMPLANT
GLOVE BIOGEL PI IND STRL 8 (GLOVE) ×2 IMPLANT
GOWN STRL REUS W/ TWL LRG LVL3 (GOWN DISPOSABLE) ×2 IMPLANT
GOWN STRL REUS W/ TWL XL LVL3 (GOWN DISPOSABLE) ×2 IMPLANT
GOWN STRL REUS W/TWL LRG LVL3 (GOWN DISPOSABLE) ×1
GOWN STRL REUS W/TWL XL LVL3 (GOWN DISPOSABLE) ×1
HANDPIECE INTERPULSE COAX TIP (DISPOSABLE) ×1
HOLDER FOLEY CATH W/STRAP (MISCELLANEOUS) IMPLANT
IMMOBILIZER KNEE 20 (SOFTGOODS) ×1
IMMOBILIZER KNEE 20 THIGH 36 (SOFTGOODS) IMPLANT
IMMOBILIZER KNEE 22 UNIV (SOFTGOODS) ×2 IMPLANT
INSERT TIB BEAR SZ 7 KNEE (Miscellaneous) IMPLANT
KNEE PATELLA ASYMMETRIC 10X35 (Knees) IMPLANT
KNEE TIBIAL COMPONENT SZ7 (Knees) IMPLANT
MANIFOLD NEPTUNE II (INSTRUMENTS) ×2 IMPLANT
NS IRRIG 1000ML POUR BTL (IV SOLUTION) ×2 IMPLANT
OSTEOTOME THIN 8 3 (MISCELLANEOUS) IMPLANT
PACK TOTAL KNEE CUSTOM (KITS) ×2 IMPLANT
PIN FLUTED HEDLESS FIX 3.5X1/8 (PIN) IMPLANT
PROTECTOR NERVE ULNAR (MISCELLANEOUS) ×2 IMPLANT
SET HNDPC FAN SPRY TIP SCT (DISPOSABLE) ×2 IMPLANT
SPIKE FLUID TRANSFER (MISCELLANEOUS) ×2 IMPLANT
SUT MNCRL AB 3-0 PS2 18 (SUTURE) ×2 IMPLANT
SUT VIC AB 0 CT1 36 (SUTURE) ×2 IMPLANT
SUT VIC AB 1 CT1 36 (SUTURE) ×4 IMPLANT
SUT VIC AB 2-0 CT1 27 (SUTURE) ×1
SUT VIC AB 2-0 CT1 TAPERPNT 27 (SUTURE) ×2 IMPLANT
TRAY FOLEY MTR SLVR 14FR STAT (SET/KITS/TRAYS/PACK) IMPLANT
TUBE SUCTION HIGH CAP CLEAR NV (SUCTIONS) ×2 IMPLANT
WRAP KNEE MAXI GEL POST OP (GAUZE/BANDAGES/DRESSINGS) ×2 IMPLANT

## 2022-12-25 NOTE — Progress Notes (Signed)
Orthopedic Tech Progress Note Patient Details:  Jeffery Houston 1971-01-07 [id number]  Patient ID: Jeffery Houston, male   DOB: [birth year], 52 y.o.   MRN: [id number]  Jeffery Houston 12/25/2022, 8:37 PM Cpm removed. Leg placed in bone foam.

## 2022-12-25 NOTE — Anesthesia Preprocedure Evaluation (Addendum)
Anesthesia Evaluation  Patient identified by MRN, date of birth, ID band Patient awake    Reviewed: Allergy & Precautions, NPO status , Patient's Chart, lab work & pertinent test results  History of Anesthesia Complications Negative for: history of anesthetic complications  Airway Mallampati: II  TM Distance: >3 FB Neck ROM: Full    Dental  (+) Dental Advisory Given   Pulmonary Patient abstained from smoking., former smoker   breath sounds clear to auscultation       Cardiovascular hypertension, Pt. on medications (-) angina  Rhythm:Regular Rate:Normal  10/2022 ECHO: EF >70%, mild LVH, normal LVF, no significant valvular abnormalities 10/2022 Stress: Stress ECG negative for ischemia. Low risk study    Neuro/Psych negative neurological ROS     GI/Hepatic negative GI ROS, Neg liver ROS,,,  Endo/Other  diabetes (glu 144)  BMI 33.9  Renal/GU negative Renal ROS     Musculoskeletal  (+) Arthritis ,    Abdominal   Peds  Hematology negative hematology ROS (+)   Anesthesia Other Findings   Reproductive/Obstetrics                             Anesthesia Physical Anesthesia Plan  ASA: 3  Anesthesia Plan: Spinal   Post-op Pain Management: Tylenol PO (pre-op)* and Regional block*   Induction: Intravenous  PONV Risk Score and Plan: 2 and Ondansetron and Treatment may vary due to age or medical condition  Airway Management Planned: Natural Airway and Simple Face Mask  Additional Equipment: None  Intra-op Plan:   Post-operative Plan:   Informed Consent: I have reviewed the patients History and Physical, chart, labs and discussed the procedure including the risks, benefits and alternatives for the proposed anesthesia with the patient or authorized representative who has indicated his/her understanding and acceptance.     Dental advisory given  Plan Discussed with: CRNA and  Surgeon  Anesthesia Plan Comments: (Plan routine monitors, SAB with adductor canal block for post op analgesia)       Anesthesia Quick Evaluation

## 2022-12-25 NOTE — Anesthesia Procedure Notes (Signed)
Spinal  Patient location during procedure: OR End time: 12/25/2022 12:20 PM Reason for block: surgical anesthesia Staffing Performed: anesthesiologist  Anesthesiologist: Annye Asa, MD Performed by: Annye Asa, MD Authorized by: Annye Asa, MD   Preanesthetic Checklist Completed: patient identified, IV checked, site marked, risks and benefits discussed, surgical consent, monitors and equipment checked, pre-op evaluation and timeout performed Spinal Block Patient position: sitting Prep: DuraPrep Patient monitoring: heart rate, cardiac monitor, continuous pulse ox and blood pressure Approach: midline Location: L3-4 Injection technique: single-shot Needle Needle type: Pencan and Introducer  Needle gauge: 24 G Needle length: 9 cm Assessment Events: CSF return Additional Notes Pt identified in Operating room.  Monitors applied. Working IV access confirmed. Sterile prep, drape lumbar spine.  1% lido local L 3,4.  #24ga Pencan into clear CSF L 3,4.  15 mg 0.75% Bupivacaine with dextrose injected with asp CSF beginning and end of injection.  Patient asymptomatic, VSS, no heme aspirated, tolerated well.  Jenita Seashore, MD

## 2022-12-25 NOTE — Progress Notes (Incomplete)
Patient's

## 2022-12-25 NOTE — Discharge Instructions (Signed)

## 2022-12-25 NOTE — Anesthesia Procedure Notes (Signed)
Anesthesia Regional Block: Adductor canal block   Pre-Anesthetic Checklist: , timeout performed,  Correct Patient, Correct Site, Correct Laterality,  Correct Procedure, Correct Position, site marked,  Risks and benefits discussed,  Surgical consent,  Pre-op evaluation,  At surgeon's request and post-op pain management  Laterality: Left and Lower  Prep: chloraprep       Needles:  Injection technique: Single-shot  Needle Type: Echogenic Needle     Needle Length: 9cm  Needle Gauge: 21     Additional Needles:   Procedures:,,,, ultrasound used (permanent image in chart),,    Narrative:  Start time: 12/25/2022 10:47 AM End time: 12/25/2022 10:53 AM Injection made incrementally with aspirations every 5 mL.  Performed by: Personally  Anesthesiologist: Annye Asa, MD  Additional Notes: Pt identified in Holding room.  Monitors applied. Working IV access confirmed. Sterile prep L thigh.  #21ga ECHOgenic Arrow block needle into adductor canal with US guidance.  20cc 0.75% Ropivacaine injected incrementally after negative test dose.  Patient asymptomatic, VSS, no heme aspirated, tolerated well.   Jenita Seashore, MD

## 2022-12-25 NOTE — OR Nursing (Signed)
X-ray at bedside

## 2022-12-25 NOTE — Transfer of Care (Signed)
Immediate Anesthesia Transfer of Care Note  Patient: Jeffery Houston  Procedure(s) Performed: TOTAL KNEE REVISION (Left: Knee)  Patient Location: PACU  Anesthesia Type:Spinal  Level of Consciousness: awake, alert , oriented, drowsy, and patient cooperative  Airway & Oxygen Therapy: Patient Spontanous Breathing and Patient connected to face mask oxygen  Post-op Assessment: Report given to RN and Post -op Vital signs reviewed and stable  Post vital signs: Reviewed and stable  Last Vitals:  Vitals Value Taken Time  BP 107/74 12/25/22 1508  Temp    Pulse 63 12/25/22 1515  Resp 14 12/25/22 1515  SpO2 100 % 12/25/22 1515  Vitals shown include unvalidated device data.  Last Pain:  Vitals:   12/25/22 1004  TempSrc: Oral  PainSc:       Patients Stated Pain Goal: 5 (77/82/42 3536)  Complications: No notable events documented.

## 2022-12-25 NOTE — Interval H&P Note (Signed)
History and Physical Interval Note:  12/25/2022 9:43 AM  Jeffery Houston  has presented today for surgery, with the diagnosis of FAILED LEFT UNICOMPARTMENTAL KNEE PROSTHESIS.  The various methods of treatment have been discussed with the patient and family. After consideration of risks, benefits and other options for treatment, the patient has consented to  Procedure(s): TOTAL KNEE REVISION (Left) as a surgical intervention.  The patient's history has been reviewed, patient examined, no change in status, stable for surgery.  I have reviewed the patient's chart and labs.  Questions were answered to the patient's satisfaction.     Renette Butters

## 2022-12-25 NOTE — Progress Notes (Signed)
Orthopedic Tech Progress Note Patient Details:  Jeffery Houston November 15, 1971 [id number]  Patient ID: Jeffery Houston, male   DOB: 1971-01-05, 52 y.o.   MRN: [id number]  Jeffery Houston 12/25/2022, 5:37 PM Cpm applied to left leg. Bone foam given to patient

## 2022-12-25 NOTE — Anesthesia Postprocedure Evaluation (Signed)
Anesthesia Post Note  Patient: Jeffery Houston  Procedure(s) Performed: TOTAL KNEE REVISION (Left: Knee)     Patient location during evaluation: PACU Anesthesia Type: Spinal Level of consciousness: awake and alert, patient cooperative and oriented Pain management: pain level controlled Vital Signs Assessment: post-procedure vital signs reviewed and stable Respiratory status: nonlabored ventilation, spontaneous breathing and respiratory function stable Cardiovascular status: blood pressure returned to baseline and stable Postop Assessment: spinal receding and no apparent nausea or vomiting Anesthetic complications: no   No notable events documented.  Last Vitals:  Vitals:   12/25/22 1515 12/25/22 1530  BP: 107/72 117/76  Pulse: 63 85  Resp: 14 13  Temp:    SpO2: 100% 95%    Last Pain:  Vitals:   12/25/22 1508  TempSrc: Oral  PainSc: 0-No pain                 Laureen Frederic,E. Mac Dowdell

## 2022-12-25 NOTE — Anesthesia Procedure Notes (Signed)
Procedure Name: MAC Date/Time: 12/25/2022 12:10 PM  Performed by: Eben Burow, CRNAPre-anesthesia Checklist: Patient identified, Emergency Drugs available, Suction available, Patient being monitored and Timeout performed Oxygen Delivery Method: Simple face mask Placement Confirmation: positive ETCO2

## 2022-12-26 ENCOUNTER — Other Ambulatory Visit (HOSPITAL_COMMUNITY): Payer: Self-pay

## 2022-12-26 LAB — GLUCOSE, CAPILLARY
Glucose-Capillary: 207 mg/dL — ABNORMAL HIGH (ref 70–99)
Glucose-Capillary: 236 mg/dL — ABNORMAL HIGH (ref 70–99)

## 2022-12-26 MED ORDER — ASPIRIN 81 MG PO TBEC
81.0000 mg | DELAYED_RELEASE_TABLET | Freq: Two times a day (BID) | ORAL | 0 refills | Status: AC
  Filled 2022-12-26: qty 60, 30d supply, fill #0

## 2022-12-26 MED ORDER — ACETAMINOPHEN 500 MG PO TABS
1000.0000 mg | ORAL_TABLET | Freq: Four times a day (QID) | ORAL | 0 refills | Status: AC | PRN
  Filled 2022-12-26: qty 60, 8d supply, fill #0

## 2022-12-26 MED ORDER — ONDANSETRON 4 MG PO TBDP
4.0000 mg | ORAL_TABLET | Freq: Three times a day (TID) | ORAL | 0 refills | Status: DC | PRN
  Filled 2022-12-26: qty 15, 5d supply, fill #0

## 2022-12-26 MED ORDER — OXYCODONE HCL 5 MG PO TABS
5.0000 mg | ORAL_TABLET | ORAL | 0 refills | Status: AC | PRN
  Filled 2022-12-26: qty 30, 5d supply, fill #0

## 2022-12-26 MED ORDER — POLYETHYLENE GLYCOL 3350 17 GM/SCOOP PO POWD
17.0000 g | Freq: Every day | ORAL | 0 refills | Status: AC
  Filled 2022-12-26: qty 238, 14d supply, fill #0

## 2022-12-26 MED ORDER — METHOCARBAMOL 750 MG PO TABS
750.0000 mg | ORAL_TABLET | Freq: Three times a day (TID) | ORAL | 0 refills | Status: AC | PRN
  Filled 2022-12-26: qty 20, 7d supply, fill #0

## 2022-12-26 NOTE — Op Note (Signed)
DATE OF SURGERY:  12/25/2022 TIME: 8:24 AM  PATIENT NAME:  Jeffery Houston   AGE: 52 y.o.    PRE-OPERATIVE DIAGNOSIS:  FAILED LEFT UNICOMPARTMENTAL KNEE PROSTHESIS  POST-OPERATIVE DIAGNOSIS:  Same  PROCEDURE:  Procedure(s): TOTAL KNEE REVISION   SURGEON:  Renette Butters, MD   ASSISTANT:  Aggie Moats, PA-C, she was present and scrubbed throughout the case, critical for completion in a timely fashion, and for retraction, instrumentation, and closure.    OPERATIVE IMPLANTS: [employer] Triathlon CR. Press fit knee  Femur size 7, Tibia size 7, Patella size 35 3-peg oval button, with a 10 mm polyethylene insert.   PREOPERATIVE INDICATIONS:  Jeffery Houston is a 52 y.o. year old male with end stage bone on bone degenerative arthritis of the knee who failed conservative treatment, including injections, antiinflammatories, activity modification, and assistive devices, and had significant impairment of their activities of daily living, and elected for Total Knee Arthroplasty.   The risks, benefits, and alternatives were discussed at length including but not limited to the risks of infection, bleeding, nerve injury, stiffness, blood clots, the need for revision surgery, cardiopulmonary complications, among others, and they were willing to proceed.   OPERATIVE DESCRIPTION:  The patient was brought to the operative room and placed in a supine position.  General anesthesia was administered.  IV antibiotics were given.  The lower extremity was prepped and draped in the usual sterile fashion.  Time out was performed.  The leg was elevated and exsanguinated and the tourniquet was inflated.  Anterior approach was performed.  The patella was everted and osteophytes were removed.  The anterior horn of the medial and lateral meniscus was removed.   I used a combination of osteotomes and saw to remove his medial arthroplasty implants. These came out with minimal bone loss.   The distal femur was opened  with the drill and the intramedullary distal femoral cutting jig was utilized, set at 5 degrees resecting 10 mm off the distal femur.  Care was taken to protect the collateral ligaments.  The distal femoral sizing jig was applied, taking care to avoid notching.  Then the 4-in-1 cutting jig was applied and the anterior and posterior femur was cut, along with the chamfer cuts.  All posterior osteophytes were removed.  The flexion gap was then measured and was symmetric with the extension gap.  Then the extramedullary tibial cutting jig was utilized making the appropriate cut using the anterior tibial crest as a reference building in appropriate posterior slope.  Care was taken during the cut to protect the medial and collateral ligaments.  The proximal tibia was removed along with the posterior horns of the menisci.    I completed the distal femoral preparation using the appropriate jig to prepare the box.  The patella was then measured, and cut with the saw.    The proximal tibia sized and prepared accordingly with the reamer and the punch, and then all components were trialed with the above sized poly insert.  The knee was found to have excellent balance and full motion.    The above named components were then impacted into place and Poly tibial piece and patella were inserted.  I was very happy with his stability and ROM  I performed a periarticular injection with Exparel  The knee was easily taken through a range of motion and the patella tracked well and the knee irrigated copiously and the parapatellar and subcutaneous tissue closed with vicryl, and monocryl with steri  strips for the skin.  The incision was dressed with sterile gauze and the tourniquet released and the patient was awakened and returned to the PACU in stable and satisfactory condition.  There were no complications.  Total tourniquet time was roughly 80 minutes.   POSTOPERATIVE PLAN: post op Abx, DVT px: SCD's, TED's, Early  ambulation and chemical px

## 2022-12-26 NOTE — Discharge Summary (Signed)
Physician Discharge Summary  Patient ID: Jeffery Houston MRN: [id number] DOB/AGE: [id number] y.o.  Admit date: 12/25/2022 Discharge date: 12/26/2022  Admission Diagnoses: failed left partial knee arthroplasty  Discharge Diagnoses:  Principal Problem:   S/P total knee arthroplasty, left   Discharged Condition: fair  Hospital Course: Patient underwent a conversion from left partial knee to left total knee arthroplasty by Dr. Percell Miller on 4/0/98 without complications. He spent the night in observation for pain control and mobilization. He has passed his PT evaluation and is ready for discharge home.   Consults: None  Significant Diagnostic Studies: n/a  Treatments: IV hydration, antibiotics: Ancef, analgesia: acetaminophen, Dilaudid, Tramadol, and Oxycodone, anticoagulation: ASA, therapies: PT and SW, and surgery: left partial to total knee conversion  Discharge Exam: Blood pressure (!) 143/67, pulse 81, temperature 98.2 F (36.8 C), temperature source Oral, resp. rate 18, height 6' 2.5" (1.892 m), weight 121.4 kg, SpO2 91 %. General appearance: alert, cooperative, and no distress Head: Normocephalic, without obvious abnormality, atraumatic Resp: clear to auscultation bilaterally Cardio: regular rate and rhythm, S1, S2 normal, no murmur, click, rub or gallop Extremities: extremities normal, atraumatic, no cyanosis or edema Pulses:  L brachial 2+ R brachial 2+  L radial 2+ R radial 2+  L inguinal 2+ R inguinal 2+  L popliteal 2+ R popliteal 2+  L posterior tibial 2+ R posterior tibial 2+  L dorsalis pedis 2+ R dorsalis pedis 2+   Neurologic: Grossly normal Incision/Wound: c/d/i  Disposition: Discharge disposition: 01-Home or Self Care       Discharge Instructions     CPM   Complete by: As directed    Continuous passive motion machine (CPM):      Use the CPM from 0 to 90 for 4-6 hours per day.      You may break it up into 2 or 3 sessions per day.      Use CPM for 3  weeks or until you are told to stop.   Call MD / Call 911   Complete by: As directed    If you experience chest pain or shortness of breath, CALL 911 and be transported to the hospital emergency room.  If you develope a fever above 101 F, pus (white drainage) or increased drainage or redness at the wound, or calf pain, call your surgeon's office.   Diet - low sodium heart healthy   Complete by: As directed    Discharge instructions   Complete by: As directed    You may bear weight as tolerated. Keep your dressing on and dry until follow up. Take medicine to prevent blood clots as directed. Take pain medicine as needed with the goal of transitioning to over the counter medicines.    INSTRUCTIONS AFTER JOINT REPLACEMENT   Remove items at home which could result in a fall. This includes throw rugs or furniture in walking pathways ICE to the affected joint every three hours while awake for 30 minutes at a time, for at least the first 3-5 days, and then as needed for pain and swelling.  Continue to use ice for pain and swelling. You may notice swelling that will progress down to the foot and ankle.  This is normal after surgery.  Elevate your leg when you are not up walking on it.   Continue to use the breathing machine you got in the hospital (incentive spirometer) which will help keep your temperature down.  It is common for your temperature to cycle up  and down following surgery, especially at night when you are not up moving around and exerting yourself.  The breathing machine keeps your lungs expanded and your temperature down.   DIET:  As you were doing prior to hospitalization, we recommend a well-balanced diet.  DRESSING / WOUND CARE / SHOWERING  You may shower 3 days after surgery, but keep the wounds dry during showering.  You may use an occlusive plastic wrap (Press'n Seal for example) with blue painter's tape at edges, NO SOAKING/SUBMERGING IN THE BATHTUB.  If the bandage gets wet,  call the office.   ACTIVITY  Increase activity slowly as tolerated, but follow the weight bearing instructions below.   No driving for 6 weeks or until further direction given by your physician.  You cannot drive while taking narcotics.  No lifting or carrying greater than 10 lbs. until further directed by your surgeon. Avoid periods of inactivity such as sitting longer than an hour when not asleep. This helps prevent blood clots.  You may return to work once you are authorized by your doctor.    WEIGHT BEARING   Weight bearing as tolerated with assist device (walker, cane, etc) as directed, use it as long as suggested by your surgeon or therapist, typically at least 4-6 weeks.   EXERCISES  Results after joint replacement surgery are often greatly improved when you follow the exercise, range of motion and muscle strengthening exercises prescribed by your doctor. Safety measures are also important to protect the joint from further injury. Any time any of these exercises cause you to have increased pain or swelling, decrease what you are doing until you are comfortable again and then slowly increase them. If you have problems or questions, call your caregiver or physical therapist for advice.   Rehabilitation is important following a joint replacement. After just a few days of immobilization, the muscles of the leg can become weakened and shrink (atrophy).  These exercises are designed to build up the tone and strength of the thigh and leg muscles and to improve motion. Often times heat used for twenty to thirty minutes before working out will loosen up your tissues and help with improving the range of motion but do not use heat for the first two weeks following surgery (sometimes heat can increase post-operative swelling).   These exercises can be done on a training (exercise) mat, on the floor, on a table or on a bed. Use whatever works the best and is most comfortable for you.    Use music or  television while you are exercising so that the exercises are a pleasant break in your day. This will make your life better with the exercises acting as a break in your routine that you can look forward to.   Perform all exercises about fifteen times, three times per day or as directed.  You should exercise both the operative leg and the other leg as well.  Exercises include:   Quad Sets - Tighten up the muscle on the front of the thigh (Quad) and hold for 5-10 seconds.   Straight Leg Raises - With your knee straight (if you were given a brace, keep it on), lift the leg to 60 degrees, hold for 3 seconds, and slowly lower the leg.  Perform this exercise against resistance later as your leg gets stronger.  Leg Slides: Lying on your back, slowly slide your foot toward your buttocks, bending your knee up off the floor (only go as far as is  comfortable). Then slowly slide your foot back down until your leg is flat on the floor again.  Angel Wings: Lying on your back spread your legs to the side as far apart as you can without causing discomfort.  Hamstring Strength:  Lying on your back, push your heel against the floor with your leg straight by tightening up the muscles of your buttocks.  Repeat, but this time bend your knee to a comfortable angle, and push your heel against the floor.  You may put a pillow under the heel to make it more comfortable if necessary.   A rehabilitation program following joint replacement surgery can speed recovery and prevent re-injury in the future due to weakened muscles. Contact your doctor or a physical therapist for more information on knee rehabilitation.    CONSTIPATION  Constipation is defined medically as fewer than three stools per week and severe constipation as less than one stool per week.  Even if you have a regular bowel pattern at home, your normal regimen is likely to be disrupted due to multiple reasons following surgery.  Combination of anesthesia,  postoperative narcotics, change in appetite and fluid intake all can affect your bowels.   YOU MUST use at least one of the following options; they are listed in order of increasing strength to get the job done.  They are all available over the counter, and you may need to use some, POSSIBLY even all of these options:    Drink plenty of fluids (prune juice may be helpful) and high fiber foods Colace 100 mg by mouth twice a day  Senokot for constipation as directed and as needed Dulcolax (bisacodyl), take with full glass of water  Miralax (polyethylene glycol) once or twice a day as needed.  If you have tried all these things and are unable to have a bowel movement in the first 3-4 days after surgery call either your surgeon or your primary doctor.    If you experience loose stools or diarrhea, hold the medications until you stool forms back up.  If your symptoms do not get better within 1 week or if they get worse, check with your doctor.  If you experience "the worst abdominal pain ever" or develop nausea or vomiting, please contact the office immediately for further recommendations for treatment.   ITCHING:  If you experience itching with your medications, try taking only a single pain pill, or even half a pain pill at a time.  You can also use Benadryl over the counter for itching or also to help with sleep.   TED HOSE STOCKINGS:  Use stockings on both legs until for at least 2 weeks or as directed by physician office. They may be removed at night for sleeping.  MEDICATIONS:  See your medication summary on the "After Visit Summary" that nursing will review with you.  You may have some home medications which will be placed on hold until you complete the course of blood thinner medication.  It is important for you to complete the blood thinner medication as prescribed.  Take medicines as prescribed.   You have several different medicines that work in different ways. - Tylenol is for mild to  moderate pain. Try to take this medicine before turning to your narcotic medicines.  - Diclofenac is to reduce pain / inflammation - Robaxin is for muscle spasms. This medicine can make you drowsy. - Oxycodone is a narcotic pain medicine.  Take this for severe pain. This medicine can be  dehydrating / constipating. - Zofran is for nausea and vomiting. - Miralax is for constipation prevention  - Aspirin is to prevent blood clots after surgery. YOU MUST TAKE THIS MEDICINE!  PRECAUTIONS:  If you experience chest pain or shortness of breath - call 911 immediately for transfer to the hospital emergency department.   If you develop a fever greater that 101 F, purulent drainage from wound, increased redness or drainage from wound, foul odor from the wound/dressing, or calf pain - CONTACT YOUR SURGEON.                                                   FOLLOW-UP APPOINTMENTS:  If you do not already have a post-op appointment, please call the office [phone] for an appointment to be seen by Dr. Percell Miller in 2 weeks.   OTHER INSTRUCTIONS:   MAKE SURE YOU:  Understand these instructions.  Get help right away if you are not doing well or get worse.    Thank you for letting us be a part of your medical care team.  It is a privilege we respect greatly.  We hope these instructions will help you stay on track for a fast and full recovery!   Do not put a pillow under the knee. Place it under the heel.   Complete by: As directed    Driving restrictions   Complete by: As directed    No driving for 2-6 weeks   Post-operative opioid taper instructions:   Complete by: As directed    POST-OPERATIVE OPIOID TAPER INSTRUCTIONS: It is important to wean off of your opioid medication as soon as possible. If you do not need pain medication after your surgery it is ok to stop day one. Opioids include: Codeine, Hydrocodone(Norco, Vicodin), Oxycodone(Percocet, oxycontin) and hydromorphone amongst others.  Long term  and even short term use of opiods can cause: Increased pain response Dependence Constipation Depression Respiratory depression And more.  Withdrawal symptoms can include Flu like symptoms Nausea, vomiting And more Techniques to manage these symptoms Hydrate well Eat regular healthy meals Stay active Use relaxation techniques(deep breathing, meditating, yoga) Do Not substitute Alcohol to help with tapering If you have been on opioids for less than two weeks and do not have pain than it is ok to stop all together.  Plan to wean off of opioids This plan should start within one week post op of your joint replacement. Maintain the same interval or time between taking each dose and first decrease the dose.  Cut the total daily intake of opioids by one tablet each day Next start to increase the time between doses. The last dose that should be eliminated is the evening dose.      TED hose   Complete by: As directed    Use stockings (TED hose) for 2 weeks on left leg(s).  You may remove them at night for sleeping.   Weight bearing as tolerated   Complete by: As directed       Allergies as of 12/26/2022       Reactions   Bee Venom Anaphylaxis   Crestor [rosuvastatin Calcium] Other (See Comments)   Myalgias   Latex Hives        Medication List     TAKE these medications    acetaminophen 500 MG tablet Commonly known as: TYLENOL Take  2 tablets (1,000 mg total) by mouth every 6 (six) hours as needed for mild pain or moderate pain.   amLODipine 10 MG tablet Commonly known as: NORVASC Take 1 tablet by mouth daily.   amoxicillin-clavulanate 875-125 MG tablet Commonly known as: AUGMENTIN Take 1 tablet by mouth 2 (two) times daily.   aspirin EC 81 MG tablet Take 1 tablet (81 mg total) by mouth 2 (two) times daily. To prevent blood clots for 30 days after surgery. What changed:  when to take this additional instructions   blood glucose meter kit and supplies  Kit Dispense based on patient and insurance preference. Use up to four times daily as directed.   Dexcom G7 Sensor Misc Use to check glucose continously   diclofenac 50 MG EC tablet Commonly known as: VOLTAREN TAKE 1 TABLET BY MOUTH 2 TIMES DAILY   EPINEPHrine 0.3 mg/0.3 mL Soaj injection Commonly known as: EPI-PEN Inject 0.3 mg into the muscle as needed for anaphylaxis.   esomeprazole 40 MG capsule Commonly known as: NEXIUM Take 1 capsule (40 mg total) by mouth daily as needed (acid reflux). What changed: when to take this   ezetimibe 10 MG tablet Commonly known as: ZETIA Take 1 tablet by mouth daily.   fluticasone 50 MCG/ACT nasal spray Commonly known as: FLONASE Place 2 sprays into both nostrils daily.   gabapentin 300 MG capsule Commonly known as: NEURONTIN Take 1 capsule by mouth 3 times daily.   Levemir FlexTouch 100 UNIT/ML FlexPen Generic drug: insulin detemir Inject 8 Units into the skin daily.   lisinopril 40 MG tablet Commonly known as: ZESTRIL Take 1 tablet (40 mg total) by mouth every morning.   metFORMIN 500 MG tablet Commonly known as: GLUCOPHAGE Take 1 tablet by mouth 2 times daily with a meal.   methocarbamol 750 MG tablet Commonly known as: Robaxin-750 Take 1 tablet (750 mg total) by mouth every 8 (eight) hours as needed for muscle spasms.   ondansetron 4 MG disintegrating tablet Commonly known as: ZOFRAN-ODT Take 1 tablet (4 mg total) by mouth every 8 (eight) hours as needed for nausea or vomiting.   oxyCODONE 5 MG immediate release tablet Commonly known as: Roxicodone Take 1 tablet (5 mg total) by mouth every 4 (four) hours as needed for severe pain.   polyethylene glycol 17 g packet Commonly known as: MiraLax Take 17 g by mouth daily. to prevent constipation   Semaglutide (1 MG/DOSE) 4 MG/3ML Sopn Inject 1 mg as directed once a week.   sildenafil 25 MG tablet Commonly known as: Viagra Take 1 tablet (25 mg total) by mouth daily as  needed for erectile dysfunction.   sodium chloride 0.65 % nasal spray Commonly known as: OCEAN Place 1 spray into the nose as needed for congestion.   Ventolin HFA 108 (90 Base) MCG/ACT inhaler Generic drug: albuterol Inhale 2 puffs into the lungs every 6 hours as needed for wheezing or shortness of breath.               Discharge Care Instructions  (From admission, onward)           Start     Ordered   12/26/22 0000  Weight bearing as tolerated        12/26/22 1448            Follow-up Information     Renette Butters, MD. Go on 01/09/2023.   Specialty: Orthopedic Surgery Why: at 4:15pm Contact information: [address]  [place] [address] [phone]                 Signed: Jenne Pane PA-C 12/26/2022, 2:48 PM

## 2022-12-26 NOTE — Progress Notes (Signed)
    Subjective: Patient reports pain as moderate.  Tolerating diet.  Urinating.   No CP, SOB.  Has mobilized some OOB with PT.   Objective:   VITALS:   Vitals:   12/26/22 0011 12/26/22 0456 12/26/22 0938 12/26/22 1219  BP: (!) 142/88 (!) 149/89 (!) 140/78 (!) 143/67  Pulse: 77 80 74 81  Resp: 17 17 18 18   Temp: 97.6 F (36.4 C) 98.1 F (36.7 C) 98.2 F (36.8 C)   TempSrc:   Oral   SpO2: 95% 94% 96% 91%  Weight:      Height:          Latest Ref Rng & Units 12/12/2022   11:36 AM 11/21/2021    9:56 AM 07/13/2021    9:41 PM  CBC  WBC 4.0 - 10.5 K/uL 9.5  12.7  10.1   Hemoglobin 13.0 - 17.0 g/dL 13.8  13.8  14.0   Hematocrit 39.0 - 52.0 % 43.3  41.7  42.7   Platelets 150 - 400 K/uL 374  369  364       Latest Ref Rng & Units 12/21/2022    9:20 AM 12/12/2022   11:36 AM 06/18/2022    1:58 PM  BMP  Glucose 70 - 99 mg/dL 146  138  179   BUN 6 - 24 mg/dL 15  13  16    Creatinine 0.76 - 1.27 mg/dL 0.86  0.73  0.90   BUN/Creat Ratio 9 - 20 17   18    Sodium 134 - 144 mmol/L 142  140  141   Potassium 3.5 - 5.2 mmol/L 4.5  3.7  4.3   Chloride 96 - 106 mmol/L 105  107  101   CO2 20 - 29 mmol/L 23  23  23    Calcium 8.7 - 10.2 mg/dL 9.5  8.6  10.1    Intake/Output      02/06 0701 02/07 0700 02/07 0701 02/08 0700   P.O. 480    I.V. (mL/kg) 1110 (9.1)    IV Piggyback 256.8    Total Intake(mL/kg) 1846.8 (15.2)    Urine (mL/kg/hr) 4250    Blood 50    Total Output 4300    Net -2453.2            Physical Exam: General: NAD.  Sitting up in bedside chair, eating lunch, calm Resp: No increased wob Cardio: regular rate and rhythm ABD soft Neurologically intact MSK Neurovascularly intact Sensation intact distally Intact pulses distally Dorsiflexion/Plantar flexion intact Incision: dressing C/D/I   Assessment: 1 Day Post-Op  S/P Procedure(s) (LRB): TOTAL KNEE REVISION (Left) by Dr. Ernesta Amble. Percell Miller on 12/25/22  Principal Problem:   S/P total knee arthroplasty,  left   Plan:  Advance diet Up with therapy Incentive Spirometry Elevate and Apply ice  Weightbearing: WBAT LLE Insicional and dressing care: Dressings left intact until follow-up and Reinforce dressings as needed Orthopedic device(s):  CPM and bone foam Showering: Keep dressing dry VTE prophylaxis: Aspirin 81mg  BID  x 30 days , SCDs, ambulation Pain control: continue current regimen Follow - up plan: 2 weeks Contact information:  Edmonia Lynch MD, Aggie Moats PA-C  Dispo: Home hopefully later today if passes PT evaluation.     Britt Bottom, PA-C Office [phone] 12/26/2022, 12:28 PM

## 2022-12-26 NOTE — Progress Notes (Signed)
Physical Therapy Treatment Patient Details Name: Jeffery Houston MRN: [id number] DOB: Sep 11, 1971 Today's Date: 12/26/2022   History of Present Illness Pt is a 52yo male presenting s/p L-TKA on 12/25/22; OF NOTE: pt had L-UKA in 2016. PMH: DM, chest pain and dyspnea, HTN, R-UKA 2017.    PT Comments    Pt seen POD1 received skilled PT evaluation in am and tx in pm with improved pain management. Demonstrated S for bed mobility, min guard for transfers, and min guard for ambulation in hallway with RW 130 ft.  Provided HEP and pt guided through supine program in am and completed exercises with safe form and minimal cuing. All education completed and pt has no further questions. Pt has met mobility goals for safe discharge home, pt reports OP PT scheduled for 12/28/2022. Should pt remain in acute care PT will follow acutely.   Recommendations for follow up therapy are one component of a multi-disciplinary discharge planning process, led by the attending physician.  Recommendations may be updated based on patient status, additional functional criteria and insurance authorization.  Follow Up Recommendations  Follow physician's recommendations for discharge plan and follow up therapies     Assistance Recommended at Discharge Intermittent Supervision/Assistance  Patient can return home with the following A little help with walking and/or transfers;A little help with bathing/dressing/bathroom;Assistance with cooking/housework;Assist for transportation;Help with stairs or ramp for entrance   Equipment Recommendations  BSC/3in1    Recommendations for Other Services       Precautions / Restrictions Precautions Precautions: Fall Precaution Comments: no pillow under the knee Restrictions Weight Bearing Restrictions: No Other Position/Activity Restrictions: wbat     Mobility  Bed Mobility Overal bed mobility: Needs Assistance Bed Mobility: Sit to Supine     Sit to supine: Supervision     General  bed mobility comments: increased time     Transfers Overall transfer level: Needs assistance Equipment used: Rolling walker (2 wheels) Transfers: Sit to/from Stand Sit to Stand: Min guard           General transfer comment: cues for proper UE and AD placement    Ambulation/Gait Ambulation/Gait assistance: Min guard Gait Distance (Feet): 130 Feet Assistive device: Rolling walker (2 wheels) Gait Pattern/deviations: Antalgic (step almost through improved B LE foot clearance and knee flexion) Gait velocity: decreased     General Gait Details:  Stairs Stairs:  (NT ramp to enter)           Wheelchair Mobility    Modified Rankin (Stroke Patients Only)       Balance Overall balance assessment: Needs assistance Sitting-balance support: Feet supported Sitting balance-Leahy Scale: Fair     Standing balance support: Single extremity supported, Reliant on assistive device for balance Standing balance-Leahy Scale: Poor                              Cognition Arousal/Alertness: Awake/alert Behavior During Therapy: WFL for tasks assessed/performed Overall Cognitive Status: Within Functional Limits for tasks assessed                                          Exercises     General Comments        Pertinent Vitals/Pain Pain Assessment Pain Assessment: 0-10 Pain Score: 4  Pain Location: L knee Pain Descriptors / Indicators: Discomfort, Operative site guarding Pain Intervention(s):  Limited activity within patient's tolerance, Monitored during session, Repositioned, Ice applied    Home Living                          Prior Function            PT Goals (current goals can now be found in the care plan section) Acute Rehab PT Goals Patient Stated Goal: want to go home and see my daughter. PT Goal Formulation: With patient Time For Goal Achievement: 01/09/23 Potential to Achieve Goals: Good    Frequency     7X/week      PT Plan      Co-evaluation              AM-PAC PT "6 Clicks" Mobility   Outcome Measure  Help needed turning from your back to your side while in a flat bed without using bedrails?: A Little Help needed moving from lying on your back to sitting on the side of a flat bed without using bedrails?: A Little Help needed moving to and from a bed to a chair (including a wheelchair)?: A Little Help needed standing up from a chair using your arms (e.g., wheelchair or bedside chair)?: A Little Help needed to walk in hospital room?: A Little Help needed climbing 3-5 steps with a railing? : Total 6 Click Score: 16    End of Session Equipment Utilized During Treatment: Gait belt Activity Tolerance: Patient limited by pain Patient left: in bed;with bed alarm set;with call bell/phone within reach Nurse Communication: Mobility status;Other (comment);Patient requests pain meds (pt requesting d/c) PT Visit Diagnosis: Unsteadiness on feet (R26.81);Other abnormalities of gait and mobility (R26.89);Muscle weakness (generalized) (M62.81);Difficulty in walking, not elsewhere classified (R26.2);Pain Pain - Right/Left: Left Pain - part of body: Knee     Time: 2423-5361 PT Time Calculation (min) (ACUTE ONLY): 17 min  Charges:  $Gait Training: 8-22 mins                    Baird Lyons, PT    Adair Patter 12/26/2022, 3:55 PM

## 2022-12-26 NOTE — Evaluation (Signed)
Physical Therapy Evaluation Patient Details Name: Jeffery Houston MRN: [id number] DOB: Mar 29, 1971 Today's Date: 12/26/2022  History of Present Illness  Pt is a 52yo male presenting s/p L-TKA on 12/25/22; OF NOTE: pt had L-UKA in 2016. PMH: DM, chest pain and dyspnea, HTN, R-UKA 2017.  Clinical Impression    Jeffery Houston is a 52 y.o. male POD 1 s/p L TKA. Patient reports I  with mobility at baseline. Patient is now limited by functional impairments (see PT problem list below) and requires min A  for bed mobility and min A  for transfers. Patient was able to ambulate 14 feet with RW and min guard level of assist. Patient instructed in exercise to facilitate ROM and circulation to manage edema with HO provided.  Provided incentive spirometer and with Vcs pt able to achieve 2500 mL. Patient will benefit from continued skilled PT interventions to address impairments and progress towards PLOF. Acute PT will follow to progress mobility and stair training in preparation for safe discharge home. Pt is very motivated to d/c home today with ed provided on importance of progression with safety and I with functional mobility tasks, interdisciplinary assessment with d/c planning and pt current limitation due to pain with pt requesting IV medication from nurse prior to tx session.        Recommendations for follow up therapy are one component of a multi-disciplinary discharge planning process, led by the attending physician.  Recommendations may be updated based on patient status, additional functional criteria and insurance authorization.  Follow Up Recommendations Follow physician's recommendations for discharge plan and follow up therapies      Assistance Recommended at Discharge Frequent or constant Supervision/Assistance  Patient can return home with the following  A little help with walking and/or transfers;A little help with bathing/dressing/bathroom;Assistance with cooking/housework;Assist for  transportation;Help with stairs or ramp for entrance    Equipment Recommendations BSC/3in1 (pt is requesting BSC due to inaccessibility of bathroom in home and ed provided on potential for out of pocket expense)  Recommendations for Other Services       Functional Status Assessment Patient has had a recent decline in their functional status and demonstrates the ability to make significant improvements in function in a reasonable and predictable amount of time.     Precautions / Restrictions Precautions Precautions: Fall Precaution Comments: no pillow under the knee Restrictions Weight Bearing Restrictions: No Other Position/Activity Restrictions: wbat      Mobility  Bed Mobility Overal bed mobility: Needs Assistance Bed Mobility: Supine to Sit     Supine to sit: Min guard     General bed mobility comments: increased time and HOB slightly elevated    Transfers Overall transfer level: Needs assistance Equipment used: Rolling walker (2 wheels) Transfers: Sit to/from Stand Sit to Stand: Min assist           General transfer comment: cues for AD and UE/LE placement assit for power up from EOB height to emulate home setting with pt reporting sleeping on the couch    Ambulation/Gait Ambulation/Gait assistance: Min guard Gait Distance (Feet): 14 Feet Assistive device: Rolling walker (2 wheels) Gait Pattern/deviations: Step-to pattern, Shuffle, Antalgic Gait velocity: decreased     General Gait Details: limited foot clearance and knee flexion in swing phase B LEs, slow cadence and discontinous steps with pt requiring 2 standing rest breaks with 14 feet  Stairs Stairs:  (NT ramp to enter)          Emergency planning/management officer  Modified Rankin (Stroke Patients Only)       Balance Overall balance assessment: Needs assistance Sitting-balance support: Bilateral upper extremity supported, Feet supported Sitting balance-Leahy Scale: Poor     Standing balance  support: Reliant on assistive device for balance Standing balance-Leahy Scale: Poor                               Pertinent Vitals/Pain Pain Assessment Pain Assessment: 0-10 Pain Score: 8  Pain Location: L knee Pain Descriptors / Indicators: Discomfort, Operative site guarding Pain Intervention(s): Limited activity within patient's tolerance, Monitored during session, Repositioned, Ice applied    Home Living Family/patient expects to be discharged to:: Private residence Living Arrangements: Spouse/significant other Available Help at Discharge: Family Type of Home: Mobile home Home Access: Ramped entrance       Home Layout: One level Home Equipment: Conservation officer, nature (2 wheels) Additional Comments: reqests BSC    Prior Function Prior Level of Function : Independent/Modified Independent             Mobility Comments: pt reports I with all ADLs, self care, IADLs, driving and no AD       Hand Dominance        Extremity/Trunk Assessment   Upper Extremity Assessment Upper Extremity Assessment: Overall WFL for tasks assessed    Lower Extremity Assessment Lower Extremity Assessment: RLE deficits/detail;LLE deficits/detail RLE Deficits / Details: R LE DF/PF 5/5 RLE Sensation: WNL LLE Deficits / Details: L PF 5/5 and DF 4/5, hip flexion 3/5 SLR with min extension lag LLE Sensation: decreased light touch    Cervical / Trunk Assessment Cervical / Trunk Assessment: Normal  Communication   Communication: No difficulties  Cognition Arousal/Alertness: Awake/alert Behavior During Therapy: WFL for tasks assessed/performed Overall Cognitive Status: Within Functional Limits for tasks assessed                                          General Comments      Exercises Total Joint Exercises Ankle Circles/Pumps: AROM, Both, 5 reps, Supine Quad Sets: AROM, Left, 5 reps, Supine Gluteal Sets: AROM, 5 reps, Left Short Arc Quad: AROM, Left, 5 reps,  Supine Heel Slides: AROM, Left, 5 reps Hip ABduction/ADduction: AROM, Left, 5 reps Straight Leg Raises: AROM, 5 reps, Left   Assessment/Plan    PT Assessment Patient needs continued PT services  PT Problem List Decreased strength;Decreased range of motion;Decreased activity tolerance;Decreased balance;Decreased coordination;Decreased knowledge of use of DME;Pain       PT Treatment Interventions DME instruction;Gait training;Functional mobility training;Therapeutic activities;Therapeutic exercise;Balance training;Neuromuscular re-education;Patient/family education    PT Goals (Current goals can be found in the Care Plan section)  Acute Rehab PT Goals Patient Stated Goal: Get home PT Goal Formulation: With patient Time For Goal Achievement: 01/09/23 Potential to Achieve Goals: Good    Frequency 7X/week     Co-evaluation               AM-PAC PT "6 Clicks" Mobility  Outcome Measure Help needed turning from your back to your side while in a flat bed without using bedrails?: A Little Help needed moving from lying on your back to sitting on the side of a flat bed without using bedrails?: A Little Help needed moving to and from a bed to a chair (including a wheelchair)?: A Little Help needed standing  up from a chair using your arms (e.g., wheelchair or bedside chair)?: A Little Help needed to walk in hospital room?: A Little Help needed climbing 3-5 steps with a railing? : Total 6 Click Score: 16    End of Session Equipment Utilized During Treatment: Gait belt Activity Tolerance: Patient limited by pain Patient left: in chair;with call bell/phone within reach;with chair alarm set Nurse Communication: Mobility status;Patient requests pain meds;Weight bearing status PT Visit Diagnosis: Unsteadiness on feet (R26.81);Other abnormalities of gait and mobility (R26.89);Muscle weakness (generalized) (M62.81);Difficulty in walking, not elsewhere classified (R26.2);Pain Pain -  Right/Left: Left Pain - part of body: Knee    Time: 4627-0350 PT Time Calculation (min) (ACUTE ONLY): 35 min   Charges:   PT Evaluation $PT Eval Low Complexity: 1 Low PT Treatments $Therapeutic Exercise: 8-22 mins        Baird Lyons, PT   Adair Patter 12/26/2022, 10:51 AM

## 2022-12-26 NOTE — TOC Transition Note (Signed)
Transition of Care [facility]) - CM/SW Discharge Note   Patient Details  Name: Jeffery Houston MRN: [id number] Date of Birth: 1971/10/08  [facility]) CM/SW Contact:  Lennart Pall, LCSW Phone Number: 12/26/2022, 9:46 AM   Clinical Narrative:     Met with pt and confirming he has received DME to home via [facility].  OPPT already arranged with [facility].  No further TOC needs.  Final next level of care: OP Rehab Barriers to Discharge: No Barriers Identified   Patient Goals and CMS Choice      Discharge Placement                         Discharge Plan and Services Additional resources added to the After Visit Summary for                  DME Arranged: N/A DME Agency: NA                  Social Determinants of Health (SDOH) Interventions SDOH Screenings   Food Insecurity: No Food Insecurity (12/25/2022)  Housing: Low Risk  (12/25/2022)  Transportation Needs: No Transportation Needs (12/25/2022)  Utilities: Not At Risk (12/25/2022)  Depression (PHQ2-9): Low Risk  (12/12/2022)  Tobacco Use: Medium Risk (12/25/2022)     Readmission Risk Interventions    12/26/2022    9:45 AM  Readmission Risk Prevention Plan  Post Dischage Appt Complete  Medication Screening Complete  Transportation Screening Complete

## 2022-12-27 ENCOUNTER — Telehealth: Payer: Self-pay

## 2022-12-27 ENCOUNTER — Encounter (HOSPITAL_COMMUNITY): Payer: Self-pay | Admitting: Orthopedic Surgery

## 2022-12-27 NOTE — Telephone Encounter (Signed)
Transition Care Management Unsuccessful Follow-up Telephone Call  Date of discharge and from where:  [facility] 12-26-22 Dx:  S/P total knee arthroplasty, left  Attempts:  1st Attempt  Reason for unsuccessful TCM follow-up call:  Left voice message   Juanda Crumble LPN [place] Direct Dial [phone]

## 2022-12-28 ENCOUNTER — Ambulatory Visit: Payer: Medicaid Other | Attending: Orthopedic Surgery | Admitting: Physical Therapy

## 2022-12-28 ENCOUNTER — Encounter: Payer: Self-pay | Admitting: Physical Therapy

## 2022-12-28 DIAGNOSIS — Z96652 Presence of left artificial knee joint: Secondary | ICD-10-CM | POA: Diagnosis present

## 2022-12-28 DIAGNOSIS — M25662 Stiffness of left knee, not elsewhere classified: Secondary | ICD-10-CM | POA: Diagnosis present

## 2022-12-28 DIAGNOSIS — M6281 Muscle weakness (generalized): Secondary | ICD-10-CM | POA: Diagnosis present

## 2022-12-28 DIAGNOSIS — R600 Localized edema: Secondary | ICD-10-CM | POA: Insufficient documentation

## 2022-12-28 NOTE — Therapy (Signed)
OUTPATIENT PHYSICAL THERAPY LOWER EXTREMITY EVALUATION   Patient Name: Jeffery Houston MRN: [id number] DOB:[birth year], 52 y.o., male Today's Date: 12/28/2022  END OF SESSION:  PT End of Session - 12/28/22 0858     Visit Number 1    Number of Visits 24    Date for PT Re-Evaluation 02/22/23    Authorization Type [place] [employer]    PT Start Time [phone]    PT Stop Time 0934    PT Time Calculation (min) 42 min    Activity Tolerance Patient limited by pain    Behavior During Therapy [facility] for tasks assessed/performed             Past Medical History:  Diagnosis Date   Arthritis    bil knees   Diabetes mellitus without complication ([place])    Exertional chest pain 01/23/2019   Exertional dyspnea 01/23/2019   Hypertension    Past Surgical History:  Procedure Laterality Date   FOOT SURGERY     PARTIAL KNEE ARTHROPLASTY Left 07/22/2015   Procedure: LEF PARTIAL KNEE REPLACEMENT ;  Surgeon: Renette Butters, MD;  Location: [place];  Service: Orthopedics;  Laterality: Left;   PARTIAL KNEE ARTHROPLASTY Right 10/05/2016   Procedure: UNICOMPARTMENTAL KNEE;  Surgeon: Renette Butters, MD;  Location: [place];  Service: Orthopedics;  Laterality: Right;  Pre/Post Op femoral nerve block   TOTAL KNEE REVISION Left 12/25/2022   Procedure: TOTAL KNEE REVISION;  Surgeon: Renette Butters, MD;  Location: [facility];  Service: Orthopedics;  Laterality: Left;   WRIST SURGERY Right    tendon repair   Patient Active Problem List   Diagnosis Date Noted   S/P total knee arthroplasty, left 12/25/2022   Lipid screening 12/21/2022   Diabetic neuropathy ([place]) 12/17/2022   Pain due to onychomycosis of toenails of both feet 12/17/2022   Statin intolerance 12/12/2022   Adverse effect of statin 12/12/2022   Acute recurrent sinusitis 12/12/2022   Hyperlipidemia 07/25/2022   Uncontrolled type 2 diabetes mellitus with hyperglycemia ([place]) 06/18/2022   Pneumonia due to COVID-19 virus  09/01/2020   Shortness of breath 01/23/2019   Laboratory examination 01/23/2019   Exertional chest pain 01/23/2019   Primary osteoarthritis of right knee 09/24/2016   Essential hypertension 09/24/2016   S/P left unicompartmental knee replacement 07/22/2015    PCP: Lazaro Arms NP  REFERRING PROVIDER: Renette Butters, MD  REFERRING DIAG:   [phone] (ICD-10-CM) - Status post left partial knee replacement  THERAPY DIAG:  Stiffness of left knee, not elsewhere classified  Total knee replacement status, left  Localized edema  Muscle weakness (generalized)  Rationale for Evaluation and Treatment: Rehabilitation  ONSET DATE: 12/25/22  SUBJECTIVE:   SUBJECTIVE STATEMENT: Patient presents to physical therapy status post left total knee replacement.  He has a history of a partial knee in 2013.  He reports no complications with surgery and is gradually increasing tolerance to activity.  He reports an incident with his CPM the first day he received it " bending his knee to 45 degrees" and he had severe pain immediately stopped using it and has not used it since that day.  The MD wants him to use it 2-3 hour per day and gradually increase to 120 deg.  He has difficulty with all aspects of self-care and mobility. He uses the walker in his home.  He is not walking more than to use the bathroom.  His leg is still wrapped in Ace bandage.  PERTINENT HISTORY: Diabetes, partial knee arthroplasty PAIN:  Are you having pain? Yes: NPRS scale: 5/10 Pain location: L knee  Pain description: sore, stiff  Aggravating factors: bending it  Relieving factors: rest, meds, elevate, ice   PRECAUTIONS: Other: TKA   WEIGHT BEARING RESTRICTIONS: No  FALLS:  Has patient fallen in last 6 months? 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, Dealer  PLOF: Independent  PATIENT GOALS: Pt would like to get better   NEXT MD VISIT: not sure , this month   OBJECTIVE:   DIAGNOSTIC FINDINGS:    PATIENT SURVEYS:  LEFS  05/80  COGNITION: Overall cognitive status: Within functional limits for tasks assessed     SENSATION: Patient reports the nerve block is worn off  EDEMA:  Circumferential: Did not test today as patient has legs wrapped and was wearing thick sweatpants   POSTURE: rounded shoulders, forward head, and flexed trunk  Patient sits with left knee flexed decreased weightbearing with gait and transfers PALPATION: Pain grossly throughout left knee, medial and lateral joint line  LOWER EXTREMITY ROM:  Active ROM Right eval Left eval  Hip flexion    Hip extension    Hip abduction    Hip adduction    Hip internal rotation    Hip external rotation    Knee flexion  75 AAROM   Knee extension  +10 AAROM  Ankle dorsiflexion    Ankle plantarflexion    Ankle inversion    Ankle eversion     (Blank rows = not tested)  With gait and transfers MMT:  MMT Right eval Left eval  Hip flexion    Hip extension    Hip abduction    Hip adduction    Hip internal rotation    Hip external rotation    Knee flexion  3+/5  Knee extension  2/5  Ankle dorsiflexion    Ankle plantarflexion    Ankle inversion    Ankle eversion     (Blank rows = not tested)  FUNCTIONAL TESTS:  5 times sit to stand: 29 sec with walker, hands assisting from elevated mat table. 101 feet 2 min walk test  GAIT: Distance walked: 101 Assistive device utilized: Walker - 2 wheeled Level of assistance: Modified independence Comments: Decreased heel strike, knee stiffness, heavy limp    TODAY'S TREATMENT:                                                                                                                              DATE: 12/28/22  PT eval, functional testing, HEP demo and  performed  x5 each  PATIENT EDUCATION:  Education details: PT/POC, HEP, AAROM  Person educated: Patient Education method: Consulting civil engineer, Media planner, Verbal cues, and Handouts Education comprehension: verbalized  understanding and needs further education  HOME EXERCISE PROGRAM: Access Code: [email or web address] URL: [email or web address] Date: 12/28/2022 Prepared by: [name]  Exercises - Supine Quad Set  - 3-5 x daily - 7 x weekly - 2 sets - 10 reps - 5 hold - Supine Heel Slide with Strap  - 3-5 x daily - 7 x weekly - 1 sets - 5-10 reps - 30 hold - Seated Long Arc Quad  - 3-5 x daily - 7 x weekly - 2 sets - 10 reps - 5 hold - Seated Hamstring Stretch with Chair  - 1 x daily - 7 x weekly - 1 sets - 5-10 reps - 30 hold - Seated Knee Flexion AAROM  - 1 x daily - 7 x weekly - 1 sets - 10 reps - 10-15 hold  ASSESSMENT:  CLINICAL IMPRESSION: Patient is a 52 y.o. male who was seen today for physical therapy evaluation and treatment for L Total knee arthroplasty.   OBJECTIVE IMPAIRMENTS: Abnormal gait, decreased activity tolerance, decreased knowledge of use of DME, decreased mobility, difficulty walking, decreased ROM, decreased strength, hypomobility, increased edema, increased fascial restrictions, impaired flexibility, and pain.   ACTIVITY LIMITATIONS: carrying, lifting, sitting, standing, squatting, sleeping, stairs, transfers, bed mobility, dressing, locomotion level, and caring for others  PARTICIPATION LIMITATIONS: meal prep, cleaning, laundry, interpersonal relationship, driving, shopping, community activity, occupation, and yard work  PERSONAL FACTORS: Profession and 1-2 comorbidities: previous L knee surgery, diabetes   are also affecting patient's functional outcome.   REHAB POTENTIAL: Excellent  CLINICAL DECISION MAKING: Stable/uncomplicated  EVALUATION COMPLEXITY: Low   GOALS: Goals reviewed with patient? Yes  SHORT TERM GOALS: Target date: 01/25/2023   Pt will  be I with HEP for L knee ROM and strength Baseline:unknown  Goal status: INITIAL  2.  Pt will be able to transfer sit to stand without UE x 5 to his walker using LEs symmetrically in < 20 sec  Baseline: uses Rt LE, walker and UE  Goal status: INITIAL  3.  Pt will be able to increase L knee flexion to 90 with AAROM in order to improve transfers  Baseline: 74 deg with strap , AAROM  Goal status: INITIAL  4.  Pt will able to demo 4/5 quad strength in order to support knee in standing  Baseline: 2/5 Goal status: INITIAL  LONG TERM GOALS: Target date: 02/22/2023    Pt will be I with HEP for L knee ROM, strength and stability Baseline: unknown Goal status: INITIAL  2.  Pt will score LEFS to 60/80 or better as a proxy for returned function as prior to injury.  Baseline: 5/80 Goal status: INITIAL  3.  Pt will be able to walk 180 feet in 2 min or more with LRAD Baseline: 101 feet with walker  Goal status: INITIAL  4.  Pt will be able to extend knee to no more than +3 deg to normalize gait pattern Baseline: +10 deg  Goal status: INITIAL  5.  L knee will flex to 110 deg or more to improve ability to squat, lift and return to work  Baseline: 74 deg AAROM  Goal status: INITIAL  6.  Pt will be able to stand, walk as needed in the community without limitation of pain.  Baseline: limited at this time, avoids due to pain  Goal status: INITIAL   PLAN:  PT FREQUENCY: 3x/week  PT DURATION: 4 weeks then 2 x for 4 more.   PLANNED INTERVENTIONS: Therapeutic exercises, Therapeutic activity, Neuromuscular re-education,  Balance training, Gait training, Patient/Family education, Self Care, Joint mobilization, Stair training, DME instructions, Cryotherapy, Moist heat, Taping, Vasopneumatic device, Manual therapy, and Re-evaluation NMES, E-stim  PLAN FOR NEXT SESSION: check HEP, AAROM as tolerated, manual, VASO, nustep, gait    Undine Nealis, PT 12/28/2022, 12:39 PM   Check all possible CPT  codes: 97164 - PT Re-evaluation, 97110- Therapeutic Exercise, [phone] - Gait Training, 97140 - Manual Therapy, 97530 - Therapeutic Activities, 97535 - Self Care, 97014 - Electrical stimulation (unattended), [phone] - Electrical stimulation (Manual), [id number] - Vaso, and [id number] - Physical performance training    Check all conditions that are expected to impact treatment: Diabetes mellitus   If treatment provided at initial evaluation, no treatment charged due to lack of authorization.     [name], PT 12/28/22 1:06 PM Phone: [phone] Fax: [phone]

## 2022-12-31 NOTE — Transitions of Care (Post Inpatient/ED Visit) (Signed)
   12/31/2022  Name: Jeffery Houston MRN: [id number] DOB: [birth year]  Today's TOC FU Call Status:    Attempted to reach the patient regarding the most recent Inpatient/ED visit.  Follow Up Plan: No further outreach attempts will be made at this time. We have been unable to contact the patient.  [place] LPN [place] Advisor Direct Dial [phone]

## 2023-01-02 ENCOUNTER — Encounter: Payer: Self-pay | Admitting: Physical Therapy

## 2023-01-02 ENCOUNTER — Ambulatory Visit: Payer: Medicaid Other | Admitting: Physical Therapy

## 2023-01-02 DIAGNOSIS — M25662 Stiffness of left knee, not elsewhere classified: Secondary | ICD-10-CM | POA: Diagnosis not present

## 2023-01-02 DIAGNOSIS — Z96652 Presence of left artificial knee joint: Secondary | ICD-10-CM

## 2023-01-02 DIAGNOSIS — R600 Localized edema: Secondary | ICD-10-CM

## 2023-01-02 NOTE — Therapy (Signed)
OUTPATIENT PHYSICAL THERAPY TREATMENT NOTE   Patient Name: Jeffery Houston MRN: [id number] DOB:July 13, 1971, 52 y.o., male Today's Date: 01/02/2023  PCP: Lazaro Arms NP   REFERRING PROVIDER: Renette Butters, MD  END OF SESSION:   PT End of Session - 01/02/23 1108     Visit Number 2    Number of Visits 24    Date for PT Re-Evaluation 02/22/23    Authorization Type [facility] [employer]    Authorization Time Period 12/31/22-03/30/23    Authorization - Visit Number 1    Authorization - Number of Visits 12    PT Start Time 1100    PT Stop Time [id number]    PT Time Calculation (min) 38 min             Past Medical History:  Diagnosis Date   Arthritis    bil knees   Diabetes mellitus without complication ([place])    Exertional chest pain 01/23/2019   Exertional dyspnea 01/23/2019   Hypertension    Past Surgical History:  Procedure Laterality Date   FOOT SURGERY     PARTIAL KNEE ARTHROPLASTY Left 07/22/2015   Procedure: LEF PARTIAL KNEE REPLACEMENT ;  Surgeon: Renette Butters, MD;  Location: [facility];  Service: Orthopedics;  Laterality: Left;   PARTIAL KNEE ARTHROPLASTY Right 10/05/2016   Procedure: UNICOMPARTMENTAL KNEE;  Surgeon: Renette Butters, MD;  Location: [facility];  Service: Orthopedics;  Laterality: Right;  Pre/Post Op femoral nerve block   TOTAL KNEE REVISION Left 12/25/2022   Procedure: TOTAL KNEE REVISION;  Surgeon: Renette Butters, MD;  Location: [facility];  Service: Orthopedics;  Laterality: Left;   WRIST SURGERY Right    tendon repair   Patient Active Problem List   Diagnosis Date Noted   S/P total knee arthroplasty, left 12/25/2022   Lipid screening 12/21/2022   Diabetic neuropathy ([place]) 12/17/2022   Pain due to onychomycosis of toenails of both feet 12/17/2022   Statin intolerance 12/12/2022   Adverse effect of statin 12/12/2022   Acute recurrent sinusitis 12/12/2022   Hyperlipidemia 07/25/2022   Uncontrolled type 2 diabetes  mellitus with hyperglycemia ([place]) 06/18/2022   Pneumonia due to COVID-19 virus 09/01/2020   Shortness of breath 01/23/2019   Laboratory examination 01/23/2019   Exertional chest pain 01/23/2019   Primary osteoarthritis of right knee 09/24/2016   Essential hypertension 09/24/2016   S/P left unicompartmental knee replacement 07/22/2015   REFERRING DIAG:    [id number] (ICD-10-CM) - Status post left partial knee replacement   THERAPY DIAG:  Stiffness of left knee, not elsewhere classified  Localized edema  Total knee replacement status, left  Rationale for Evaluation and Treatment Rehabilitation  PERTINENT HISTORY: Diabetes, partial knee arthroplasty  PRECAUTIONS: Other: TKA    WEIGHT BEARING RESTRICTIONS: No  SUBJECTIVE:  SUBJECTIVE STATEMENT:  I did the exercises but I have been in a lot of pain in general.    PAIN:  Are you having pain? Yes: NPRS scale: 5/10 Pain location: L knee  Pain description: sore, stiff  Aggravating factors: bending it  Relieving factors: rest, meds, elevate, ice    OBJECTIVE: (objective measures completed at initial evaluation unless otherwise dated)   DIAGNOSTIC FINDINGS:     PATIENT SURVEYS:  LEFS  05/80   COGNITION: Overall cognitive status: Within functional limits for tasks assessed                         SENSATION: Patient reports the nerve block is worn off   EDEMA:  Circumferential: Did not test today as patient has legs wrapped and was wearing thick sweatpants     POSTURE: rounded shoulders, forward head, and flexed trunk  Patient sits with left knee flexed decreased weightbearing with gait and transfers PALPATION: Pain grossly throughout left knee, medial and lateral joint line   LOWER EXTREMITY ROM:   Active ROM Right eval Left eval  LEFT 01/02/23  Hip flexion       Hip extension       Hip abduction       Hip adduction       Hip internal rotation       Hip external rotation       Knee flexion   75 AAROM   83 AAROM  Knee extension   +10 AAROM   Ankle dorsiflexion       Ankle plantarflexion       Ankle inversion       Ankle eversion        (Blank rows = not tested)   With gait and transfers MMT:   MMT Right eval Left eval  Hip flexion      Hip extension      Hip abduction      Hip adduction      Hip internal rotation      Hip external rotation      Knee flexion   3+/5  Knee extension   2/5  Ankle dorsiflexion      Ankle plantarflexion      Ankle inversion      Ankle eversion       (Blank rows = not tested)   FUNCTIONAL TESTS:  5 times sit to stand: 29 sec with walker, hands assisting from elevated mat table. 101 feet 2 min walk test  GAIT: Distance walked: 101 Assistive device utilized: Environmental consultant - 2 wheeled Level of assistance: Modified independence Comments: Decreased heel strike, knee stiffness, heavy limp      TODAY'S TREATMENT:        [facility] Adult PT Treatment:                                                DATE: 01/02/23 Therapeutic Exercise: Nustep L3 x 5 minutes  Seated heel slide Seated QS Supine heel slide using sliding board SLR x 5 - min quad lag SAQ 5 sec x 10 Hamstring stretch with strap x 3  Supine QS into towel Seated AAROM high mat table  Seated LAQ   Therapeutic Activity: SPC training due to patient arriving with RW folded and using with RUE. Good safety 50 ft x 2 .  DATE: 12/28/22  PT eval, functional testing, HEP demo and performed  x5 each   PATIENT EDUCATION:  Education details: PT/POC, HEP, AAROM  Person educated: Patient Education method: Consulting civil engineer, Demonstration, Verbal cues, and Handouts Education comprehension: verbalized understanding and  needs further education   HOME EXERCISE PROGRAM: Access Code: [email or web address] URL: [email or web address] Date: 12/28/2022 Prepared by: [name]   Exercises - Supine Quad Set  - 3-5 x daily - 7 x weekly - 2 sets - 10 reps - 5 hold - Supine Heel Slide with Strap  - 3-5 x daily - 7 x weekly - 1 sets - 5-10 reps - 30 hold - Seated Long Arc Quad  - 3-5 x daily - 7 x weekly - 2 sets - 10 reps - 5 hold - Seated Hamstring Stretch with Chair  - 1 x daily - 7 x weekly - 1 sets - 5-10 reps - 30 hold - Seated Knee Flexion AAROM  - 1 x daily - 7 x weekly - 1 sets - 10 reps - 10-15 hold   ASSESSMENT:   CLINICAL IMPRESSION: Patient is a 52 y.o. male who was seen today for physical therapy treatment for L Total knee arthroplasty 12/25/22. He reports compliance with HEP and continued pain and edema. He arrives with folded RW pushing it on his right side. Began gait training with [facility] which he did well with  good safety. Min Quad lag and pt educated on need for AD due to quad weakness. Reviewed HEP and began Nustep. He declined VASO or ice pack in clinic. Wants to self Ice at home.    OBJECTIVE IMPAIRMENTS: Abnormal gait, decreased activity tolerance, decreased knowledge of use of DME, decreased mobility, difficulty walking, decreased ROM, decreased strength, hypomobility, increased edema, increased fascial restrictions, impaired flexibility, and pain.    ACTIVITY LIMITATIONS: carrying, lifting, sitting, standing, squatting, sleeping, stairs, transfers, bed mobility, dressing, locomotion level, and caring for others   PARTICIPATION LIMITATIONS: meal prep, cleaning, laundry, interpersonal relationship, driving, shopping, community activity, occupation, and yard work   PERSONAL FACTORS: Profession and 1-2 comorbidities: previous L knee surgery, diabetes   are also affecting patient's functional outcome.    REHAB POTENTIAL: Excellent   CLINICAL DECISION MAKING: Stable/uncomplicated   EVALUATION  COMPLEXITY: Low     GOALS: Goals reviewed with patient? Yes   SHORT TERM GOALS: Target date: 01/25/2023   Pt will be I with HEP for L knee ROM and strength Baseline:unknown  Goal status: INITIAL   2.  Pt will be able to transfer sit to stand without UE x 5 to his walker using LEs symmetrically in < 20 sec  Baseline: uses Rt LE, walker and UE  Goal status: INITIAL   3.  Pt will be able to increase L knee flexion to 90 with AAROM in order to improve transfers  Baseline: 74 deg with strap , AAROM  Goal status: INITIAL   4.  Pt will able to demo 4/5 quad strength in order to support knee in standing  Baseline: 2/5 Goal status: INITIAL   LONG TERM GOALS: Target date: 02/22/2023     Pt will be I with HEP for L knee ROM, strength and stability Baseline: unknown Goal status: INITIAL   2.  Pt will score LEFS to 60/80 or better as a proxy for returned function as prior to injury.  Baseline: 5/80 Goal status: INITIAL   3.  Pt will be able to walk 180 feet in 2 min or more with LRAD Baseline:  101 feet with walker  Goal status: INITIAL   4.  Pt will be able to extend knee to no more than +3 deg to normalize gait pattern Baseline: +10 deg  Goal status: INITIAL   5.  L knee will flex to 110 deg or more to improve ability to squat, lift and return to work  Baseline: 74 deg AAROM  Goal status: INITIAL   6.  Pt will be able to stand, walk as needed in the community without limitation of pain.  Baseline: limited at this time, avoids due to pain  Goal status: INITIAL     PLAN:   PT FREQUENCY: 3x/week   PT DURATION: 4 weeks then 2 x for 4 more.    PLANNED INTERVENTIONS: Therapeutic exercises, Therapeutic activity, Neuromuscular re-education, Balance training, Gait training, Patient/Family education, Self Care, Joint mobilization, Stair training, DME instructions, Cryotherapy, Moist heat, Taping, Vasopneumatic device, Manual therapy, and Re-evaluation NMES, E-stim   PLAN FOR NEXT  SESSION: check HEP, AAROM as tolerated, manual, VASO, nustep, gait    Hessie Diener, PTA 01/02/23 11:45 AM Phone: [phone] Fax: [phone]

## 2023-01-04 LAB — COLOGUARD: COLOGUARD: NEGATIVE

## 2023-01-04 NOTE — Progress Notes (Signed)
Normal test, repeat in 3 years

## 2023-01-08 ENCOUNTER — Ambulatory Visit: Payer: Medicaid Other | Admitting: Cardiology

## 2023-01-09 ENCOUNTER — Ambulatory Visit: Payer: Medicaid Other | Admitting: Physical Therapy

## 2023-01-09 ENCOUNTER — Other Ambulatory Visit: Payer: Self-pay | Admitting: Nurse Practitioner

## 2023-01-11 ENCOUNTER — Ambulatory Visit: Payer: Medicaid Other | Admitting: Physical Therapy

## 2023-01-11 ENCOUNTER — Encounter: Payer: Self-pay | Admitting: Physical Therapy

## 2023-01-11 DIAGNOSIS — Z96652 Presence of left artificial knee joint: Secondary | ICD-10-CM

## 2023-01-11 DIAGNOSIS — M25662 Stiffness of left knee, not elsewhere classified: Secondary | ICD-10-CM | POA: Diagnosis not present

## 2023-01-11 DIAGNOSIS — R600 Localized edema: Secondary | ICD-10-CM

## 2023-01-11 DIAGNOSIS — M6281 Muscle weakness (generalized): Secondary | ICD-10-CM

## 2023-01-11 NOTE — Therapy (Deleted)
OUTPATIENT PHYSICAL THERAPY TREATMENT NOTE   Patient Name: Jeffery Houston MRN: [id number] DOB:[birth year], 52 y.o., male Today's Date: 01/11/2023  PCP: Lazaro Arms NP   REFERRING PROVIDER: Renette Butters, MD  END OF SESSION:     Past Medical History:  Diagnosis Date   Arthritis    bil knees   Diabetes mellitus without complication ([place])    Exertional chest pain 01/23/2019   Exertional dyspnea 01/23/2019   Hypertension    Past Surgical History:  Procedure Laterality Date   FOOT SURGERY     PARTIAL KNEE ARTHROPLASTY Left 07/22/2015   Procedure: LEF PARTIAL KNEE REPLACEMENT ;  Surgeon: Renette Butters, MD;  Location: [facility];  Service: Orthopedics;  Laterality: Left;   PARTIAL KNEE ARTHROPLASTY Right 10/05/2016   Procedure: UNICOMPARTMENTAL KNEE;  Surgeon: Renette Butters, MD;  Location: [facility];  Service: Orthopedics;  Laterality: Right;  Pre/Post Op femoral nerve block   TOTAL KNEE REVISION Left 12/25/2022   Procedure: TOTAL KNEE REVISION;  Surgeon: Renette Butters, MD;  Location[facility];  Service: Orthopedics;  Laterality: Left;   WRIST SURGERY Right    tendon repair   Patient Active Problem List   Diagnosis Date Noted   S/P total knee arthroplasty, left 12/25/2022   Lipid screening 12/21/2022   Diabetic neuropathy ([place]) 12/17/2022   Pain due to onychomycosis of toenails of both feet 12/17/2022   Statin intolerance 12/12/2022   Adverse effect of statin 12/12/2022   Acute recurrent sinusitis 12/12/2022   Hyperlipidemia 07/25/2022   Uncontrolled type 2 diabetes mellitus with hyperglycemia ([place]) 06/18/2022   Pneumonia due to COVID-19 virus 09/01/2020   Shortness of breath 01/23/2019   Laboratory examination 01/23/2019   Exertional chest pain 01/23/2019   Primary osteoarthritis of right knee 09/24/2016   Essential hypertension 09/24/2016   S/P left unicompartmental knee replacement 07/22/2015   REFERRING DIAG:    [id number]  (ICD-10-CM) - Status post left partial knee replacement   THERAPY DIAG:  No diagnosis found.  Rationale for Evaluation and Treatment Rehabilitation  PERTINENT HISTORY: Diabetes, partial knee arthroplasty  PRECAUTIONS: Other: TKA    WEIGHT BEARING RESTRICTIONS: No  SUBJECTIVE:                                                                                                                                                                                      SUBJECTIVE STATEMENT:  Pt reports he he doing the exercises. He reports a smaller car is causing more pain in the knee.    PAIN:  Are you having pain? Yes: NPRS scale: 8/10 Pain location: L knee  Pain description: sore, stiff  Aggravating factors: bending it  Relieving factors: rest, meds, elevate, ice    OBJECTIVE: (objective measures completed at initial evaluation unless otherwise dated)   DIAGNOSTIC FINDINGS:     PATIENT SURVEYS:  LEFS  05/80   COGNITION: Overall cognitive status: Within functional limits for tasks assessed                         SENSATION: Patient reports the nerve block is worn off   EDEMA:  Circumferential: Did not test today as patient has legs wrapped and was wearing thick sweatpants     POSTURE: rounded shoulders, forward head, and flexed trunk  Patient sits with left knee flexed decreased weightbearing with gait and transfers PALPATION: Pain grossly throughout left knee, medial and lateral joint line   LOWER EXTREMITY ROM:   Active ROM Right eval Left eval LEFT 01/02/23 Left  01/11/23  Hip flexion        Hip extension        Hip abduction        Hip adduction        Hip internal rotation        Hip external rotation        Knee flexion   75 AAROM   83 AAROM 92 AAROM  Knee extension   +10 AAROM    Ankle dorsiflexion        Ankle plantarflexion        Ankle inversion        Ankle eversion         (Blank rows = not tested)   With gait and transfers MMT:   MMT  Right eval Left eval  Hip flexion      Hip extension      Hip abduction      Hip adduction      Hip internal rotation      Hip external rotation      Knee flexion   3+/5  Knee extension   2/5  Ankle dorsiflexion      Ankle plantarflexion      Ankle inversion      Ankle eversion       (Blank rows = not tested)   FUNCTIONAL TESTS:  5 times sit to stand: 29 sec with walker, hands assisting from elevated mat table. 101 feet 2 min walk test  GAIT: Distance walked: 101 Assistive device utilized: Walker - 2 wheeled Level of assistance: Modified independence Comments: Decreased heel strike, knee stiffness, heavy limp      TODAY'S TREATMENT:         [facility] Adult PT Treatment:                                                DATE: 01/14/23 Therapeutic Exercise: *** Manual Therapy: *** Neuromuscular re-ed: *** Therapeutic Activity: *** Modalities: *** Self Care: ***  [facility] Adult PT Treatment:                                                DATE: 01/11/23 Therapeutic Exercise: Nustep L 5x 5 minutes Seated heel slide  Seated QS Seated LAQ x 15 Supine heel slide using sliding board SLR with strap assist  x 10 SAQ x 15 Hamstring stretch with strap  QS into towel 5 sec x 15  Seated  flexion AAROM legs crossed   [facility] Adult PT Treatment:                                                DATE: 01/02/23 Therapeutic Exercise: Nustep L3 x 5 minutes  Seated heel slide Seated QS Supine heel slide using sliding board SLR x 5 - min quad lag SAQ 5 sec x 10 Hamstring stretch with strap x 3  Supine QS into towel Seated AAROM high mat table  Seated LAQ   Therapeutic Activity: SPC training due to patient arriving with RW folded and using with RUE. Good safety 50 ft x 2 .                                                                                                                          DATE: 12/28/22  PT eval, functional testing, HEP demo and performed  x5 each   PATIENT EDUCATION:   Education details: PT/POC, HEP, AAROM  Person educated: Patient Education method: Consulting civil engineer, Demonstration, Verbal cues, and Handouts Education comprehension: verbalized understanding and needs further education   HOME EXERCISE PROGRAM: Access Code: [email or web address] URL: [email or web address] Date: 12/28/2022 Prepared by: [name]   Exercises - Supine Quad Set  - 3-5 x daily - 7 x weekly - 2 sets - 10 reps - 5 hold - Supine Heel Slide with Strap  - 3-5 x daily - 7 x weekly - 1 sets - 5-10 reps - 30 hold - Seated Long Arc Quad  - 3-5 x daily - 7 x weekly - 2 sets - 10 reps - 5 hold - Seated Hamstring Stretch with Chair  - 1 x daily - 7 x weekly - 1 sets - 5-10 reps - 30 hold - Seated Knee Flexion AAROM  - 1 x daily - 7 x weekly - 1 sets - 10 reps - 10-15 hold   ASSESSMENT:   CLINICAL IMPRESSION: Patient is a 52 y.o. male who was seen today for physical therapy treatment for L Total knee arthroplasty 12/25/22. He reports compliance with HEP and continued pain and edema. He arrives with folded RW pushing it on his right side. Began gait training with [facility] which he did well with  good safety. Min Quad lag and pt educated on need for AD due to quad weakness. Reviewed HEP and began Nustep. He declined VASO or ice pack in clinic. Wants to self Ice at home.    OBJECTIVE IMPAIRMENTS: Abnormal gait, decreased activity tolerance, decreased knowledge of use of DME, decreased mobility, difficulty walking, decreased ROM, decreased strength, hypomobility, increased edema, increased fascial restrictions, impaired flexibility, and pain.    ACTIVITY LIMITATIONS: carrying, lifting, sitting, standing, squatting, sleeping, stairs, transfers, bed mobility, dressing, locomotion level,  and caring for others   PARTICIPATION LIMITATIONS: meal prep, cleaning, laundry, interpersonal relationship, driving, shopping, community activity, occupation, and yard work   PERSONAL FACTORS: Profession and 1-2  comorbidities: previous L knee surgery, diabetes   are also affecting patient's functional outcome.    REHAB POTENTIAL: Excellent   CLINICAL DECISION MAKING: Stable/uncomplicated   EVALUATION COMPLEXITY: Low     GOALS: Goals reviewed with patient? Yes   SHORT TERM GOALS: Target date: 01/25/2023   Pt will be I with HEP for L knee ROM and strength Baseline:unknown  Goal status: INITIAL   2.  Pt will be able to transfer sit to stand without UE x 5 to his walker using LEs symmetrically in < 20 sec  Baseline: uses Rt LE, walker and UE  Goal status: INITIAL   3.  Pt will be able to increase L knee flexion to 90 with AAROM in order to improve transfers  Baseline: 74 deg with strap , AAROM  Goal status: INITIAL   4.  Pt will able to demo 4/5 quad strength in order to support knee in standing  Baseline: 2/5 Goal status: INITIAL   LONG TERM GOALS: Target date: 02/22/2023     Pt will be I with HEP for L knee ROM, strength and stability Baseline: unknown Goal status: INITIAL   2.  Pt will score LEFS to 60/80 or better as a proxy for returned function as prior to injury.  Baseline: 5/80 Goal status: INITIAL   3.  Pt will be able to walk 180 feet in 2 min or more with LRAD Baseline: 101 feet with walker  Goal status: INITIAL   4.  Pt will be able to extend knee to no more than +3 deg to normalize gait pattern Baseline: +10 deg  Goal status: INITIAL   5.  L knee will flex to 110 deg or more to improve ability to squat, lift and return to work  Baseline: 74 deg AAROM  Goal status: INITIAL   6.  Pt will be able to stand, walk as needed in the community without limitation of pain.  Baseline: limited at this time, avoids due to pain  Goal status: INITIAL     PLAN:   PT FREQUENCY: 3x/week   PT DURATION: 4 weeks then 2 x for 4 more.    PLANNED INTERVENTIONS: Therapeutic exercises, Therapeutic activity, Neuromuscular re-education, Balance training, Gait training, Patient/Family  education, Self Care, Joint mobilization, Stair training, DME instructions, Cryotherapy, Moist heat, Taping, Vasopneumatic device, Manual therapy, and Re-evaluation NMES, E-stim   PLAN FOR NEXT SESSION: check HEP, AAROM as tolerated, manual, VASO, nustep, gait    Hessie Diener, PTA 01/11/23 11:42 AM Phone: [phone] Fax: [phone]

## 2023-01-11 NOTE — Therapy (Signed)
OUTPATIENT PHYSICAL THERAPY TREATMENT NOTE   Patient Name: Jeffery Houston MRN: [id number] DOB:09/16/1971, 52 y.o., male Today's Date: 01/11/2023  PCP: Lazaro Arms NP   REFERRING PROVIDER: Renette Butters, MD  END OF SESSION:   PT End of Session - 01/11/23 0850     Visit Number 3    Number of Visits 24    Date for PT Re-Evaluation 02/22/23    Authorization Type [employer]    Authorization Time Period 12/31/22-03/30/23    Authorization - Visit Number 2    Authorization - Number of Visits 12    PT Start Time 0848    PT Stop Time 0930    PT Time Calculation (min) 42 min             Past Medical History:  Diagnosis Date   Arthritis    bil knees   Diabetes mellitus without complication ([place])    Exertional chest pain 01/23/2019   Exertional dyspnea 01/23/2019   Hypertension    Past Surgical History:  Procedure Laterality Date   FOOT SURGERY     PARTIAL KNEE ARTHROPLASTY Left 07/22/2015   Procedure: LEF PARTIAL KNEE REPLACEMENT ;  Surgeon: Renette Butters, MD;  Location: [place];  Service: Orthopedics;  Laterality: Left;   PARTIAL KNEE ARTHROPLASTY Right 10/05/2016   Procedure: UNICOMPARTMENTAL KNEE;  Surgeon: Renette Butters, MD;  Location: [place];  Service: Orthopedics;  Laterality: Right;  Pre/Post Op femoral nerve block   TOTAL KNEE REVISION Left 12/25/2022   Procedure: TOTAL KNEE REVISION;  Surgeon: Renette Butters, MD;  Location: [facility];  Service: Orthopedics;  Laterality: Left;   WRIST SURGERY Right    tendon repair   Patient Active Problem List   Diagnosis Date Noted   S/P total knee arthroplasty, left 12/25/2022   Lipid screening 12/21/2022   Diabetic neuropathy ([place]) 12/17/2022   Pain due to onychomycosis of toenails of both feet 12/17/2022   Statin intolerance 12/12/2022   Adverse effect of statin 12/12/2022   Acute recurrent sinusitis 12/12/2022   Hyperlipidemia 07/25/2022   Uncontrolled type 2 diabetes  mellitus with hyperglycemia ([place]) 06/18/2022   Pneumonia due to COVID-19 virus 09/01/2020   Shortness of breath 01/23/2019   Laboratory examination 01/23/2019   Exertional chest pain 01/23/2019   Primary osteoarthritis of right knee 09/24/2016   Essential hypertension 09/24/2016   S/P left unicompartmental knee replacement 07/22/2015   REFERRING DIAG:    [id number] (ICD-10-CM) - Status post left partial knee replacement   THERAPY DIAG:  Stiffness of left knee, not elsewhere classified  Localized edema  Total knee replacement status, left  Muscle weakness (generalized)  Rationale for Evaluation and Treatment Rehabilitation  PERTINENT HISTORY: Diabetes, partial knee arthroplasty  PRECAUTIONS: Other: TKA    WEIGHT BEARING RESTRICTIONS: No  SUBJECTIVE:  SUBJECTIVE STATEMENT:  Pt reports he he doing the exercises. He reports a smaller car is causing more pain in the knee. He has transitioned to [facility] as of 2 days ago.    PAIN:  Are you having pain? Yes: NPRS scale: 8/10 Pain location: L knee  Pain description: sore, stiff  Aggravating factors: bending it  Relieving factors: rest, meds, elevate, ice    OBJECTIVE: (objective measures completed at initial evaluation unless otherwise dated)   DIAGNOSTIC FINDINGS:     PATIENT SURVEYS:  LEFS  05/80   COGNITION: Overall cognitive status: Within functional limits for tasks assessed                         SENSATION: Patient reports the nerve block is worn off   EDEMA:  Circumferential: Did not test today as patient has legs wrapped and was wearing thick sweatpants     POSTURE: rounded shoulders, forward head, and flexed trunk  Patient sits with left knee flexed decreased weightbearing with gait and transfers PALPATION: Pain grossly throughout  left knee, medial and lateral joint line   LOWER EXTREMITY ROM:   Active ROM Right eval Left eval LEFT 01/02/23 Left  01/11/23  Hip flexion        Hip extension        Hip abduction        Hip adduction        Hip internal rotation        Hip external rotation        Knee flexion   75 AAROM   83 AAROM 92 AAROM  Knee extension   +10 AAROM    Ankle dorsiflexion        Ankle plantarflexion        Ankle inversion        Ankle eversion         (Blank rows = not tested)   With gait and transfers MMT:   MMT Right eval Left eval  Hip flexion      Hip extension      Hip abduction      Hip adduction      Hip internal rotation      Hip external rotation      Knee flexion   3+/5  Knee extension   2/5  Ankle dorsiflexion      Ankle plantarflexion      Ankle inversion      Ankle eversion       (Blank rows = not tested)   FUNCTIONAL TESTS:  5 times sit to stand: 29 sec with walker, hands assisting from elevated mat table. 101 feet 2 min walk test  GAIT: Distance walked: 101 Assistive device utilized: Environmental consultant - 2 wheeled Level of assistance: Modified independence Comments: Decreased heel strike, knee stiffness, heavy limp      TODAY'S TREATMENT:        [facility] Adult PT Treatment:                                                DATE: 01/11/23 Therapeutic Exercise: Nustep L 5x 5 minutes Seated heel slide  Seated QS Seated LAQ x 15 Supine heel slide using sliding board SLR with strap assist x 10 SAQ x 15 Hamstring stretch with strap  QS into towel 5 sec x 15  Seated  flexion AAROM legs crossed   [facility] Adult PT Treatment:                                                DATE: 01/02/23 Therapeutic Exercise: Nustep L3 x 5 minutes  Seated heel slide Seated QS Supine heel slide using sliding board SLR x 5 - min quad lag SAQ 5 sec x 10 Hamstring stretch with strap x 3  Supine QS into towel Seated AAROM high mat table  Seated LAQ   Therapeutic Activity: SPC training due to  patient arriving with RW folded and using with RUE. Good safety 50 ft x 2 .                                                                                                                          DATE: 12/28/22  PT eval, functional testing, HEP demo and performed  x5 each   PATIENT EDUCATION:  Education details: PT/POC, HEP, AAROM  Person educated: Patient Education method: Consulting civil engineer, Demonstration, Verbal cues, and Handouts Education comprehension: verbalized understanding and needs further education   HOME EXERCISE PROGRAM: Access Code: [email or web address] URL: [email or web address] Date: 12/28/2022 Prepared by: [name]   Exercises - Supine Quad Set  - 3-5 x daily - 7 x weekly - 2 sets - 10 reps - 5 hold - Supine Heel Slide with Strap  - 3-5 x daily - 7 x weekly - 1 sets - 5-10 reps - 30 hold - Seated Long Arc Quad  - 3-5 x daily - 7 x weekly - 2 sets - 10 reps - 5 hold - Seated Hamstring Stretch with Chair  - 1 x daily - 7 x weekly - 1 sets - 5-10 reps - 30 hold - Seated Knee Flexion AAROM  - 1 x daily - 7 x weekly - 1 sets - 10 reps - 10-15 hold   ASSESSMENT:   CLINICAL IMPRESSION: Patient is a 52 y.o. male who was seen today for physical therapy treatment for L Total knee arthroplasty 12/25/22. He reports compliance with HEP and continued pain and edema. He arrives with [facility] strarted using 2 days ago.  Min Quad lag and pt educated on need for AD due to continued quad weakness. Continued ROM and quad activation.  He declined VASO or ice pack in clinic. Wants to self Ice at home. He achieved 92 degrees AAROM knee flexion.    OBJECTIVE IMPAIRMENTS: Abnormal gait, decreased activity tolerance, decreased knowledge of use of DME, decreased mobility, difficulty walking, decreased ROM, decreased strength, hypomobility, increased edema, increased fascial restrictions, impaired flexibility, and pain.    ACTIVITY LIMITATIONS: carrying, lifting, sitting, standing, squatting,  sleeping, stairs, transfers, bed mobility, dressing, locomotion level, and caring for others   PARTICIPATION LIMITATIONS: meal prep, cleaning, laundry, interpersonal relationship, driving, shopping, community activity, occupation, and yard work  PERSONAL FACTORS: Profession and 1-2 comorbidities: previous L knee surgery, diabetes   are also affecting patient's functional outcome.    REHAB POTENTIAL: Excellent   CLINICAL DECISION MAKING: Stable/uncomplicated   EVALUATION COMPLEXITY: Low     GOALS: Goals reviewed with patient? Yes   SHORT TERM GOALS: Target date: 01/25/2023   Pt will be I with HEP for L knee ROM and strength Baseline:unknown  Goal status: INITIAL   2.  Pt will be able to transfer sit to stand without UE x 5 to his walker using LEs symmetrically in < 20 sec  Baseline: uses Rt LE, walker and UE  Goal status: INITIAL   3.  Pt will be able to increase L knee flexion to 90 with AAROM in order to improve transfers  Baseline: 74 deg with strap , AAROM  Goal status: INITIAL   4.  Pt will able to demo 4/5 quad strength in order to support knee in standing  Baseline: 2/5 Goal status: INITIAL   LONG TERM GOALS: Target date: 02/22/2023     Pt will be I with HEP for L knee ROM, strength and stability Baseline: unknown Goal status: INITIAL   2.  Pt will score LEFS to 60/80 or better as a proxy for returned function as prior to injury.  Baseline: 5/80 Goal status: INITIAL   3.  Pt will be able to walk 180 feet in 2 min or more with LRAD Baseline: 101 feet with walker  Goal status: INITIAL   4.  Pt will be able to extend knee to no more than +3 deg to normalize gait pattern Baseline: +10 deg  Goal status: INITIAL   5.  L knee will flex to 110 deg or more to improve ability to squat, lift and return to work  Baseline: 74 deg AAROM  Goal status: INITIAL   6.  Pt will be able to stand, walk as needed in the community without limitation of pain.  Baseline: limited at  this time, avoids due to pain  Goal status: INITIAL     PLAN:   PT FREQUENCY: 3x/week   PT DURATION: 4 weeks then 2 x for 4 more.    PLANNED INTERVENTIONS: Therapeutic exercises, Therapeutic activity, Neuromuscular re-education, Balance training, Gait training, Patient/Family education, Self Care, Joint mobilization, Stair training, DME instructions, Cryotherapy, Moist heat, Taping, Vasopneumatic device, Manual therapy, and Re-evaluation NMES, E-stim   PLAN FOR NEXT SESSION: check HEP, AAROM as tolerated, manual, VASO, nustep, gait    Hessie Diener, PTA 01/11/23 2:01 PM Phone: [phone] Fax: [phone]

## 2023-01-14 ENCOUNTER — Telehealth: Payer: Self-pay | Admitting: Physical Therapy

## 2023-01-14 ENCOUNTER — Ambulatory Visit: Payer: Medicaid Other | Admitting: Physical Therapy

## 2023-01-14 ENCOUNTER — Encounter: Payer: Self-pay | Admitting: Physical Therapy

## 2023-01-14 NOTE — Telephone Encounter (Signed)
Called patient this morning regarding his missed appointment at 8:45.  He reports he mixed up his days.  He declined rescheduling for a later appt time.  He was reminded of his next 2 appts this week.   [name], PT 01/14/23 9:04 AM Phone: [phone] Fax: [phone]

## 2023-01-15 ENCOUNTER — Other Ambulatory Visit: Payer: Self-pay

## 2023-01-15 ENCOUNTER — Telehealth: Payer: Self-pay

## 2023-01-15 NOTE — Telephone Encounter (Signed)
[employer] prior authorization request submitted to insurance today via CoverMyMeds Key: [id number]

## 2023-01-15 NOTE — Telephone Encounter (Signed)
[place] APPROVED UNTIL 01/15/23-PHARMACY PROCESSED PRESCRIPTION TODAY

## 2023-01-16 ENCOUNTER — Ambulatory Visit: Payer: Medicaid Other | Admitting: Physical Therapy

## 2023-01-16 ENCOUNTER — Telehealth: Payer: Self-pay | Admitting: Physical Therapy

## 2023-01-16 NOTE — Telephone Encounter (Signed)
Called patient regarding his 2nd no show visit.  Left message on voicemail about attendance policy. Will cancel  all but his next appt as he is High Priority post surgical.  Asked that he call to let us know if he is unable to make his appt.    [name], PT 01/16/23 11:31 AM Phone: [phone] Fax: [phone]

## 2023-01-16 NOTE — Therapy (Deleted)
OUTPATIENT PHYSICAL THERAPY TREATMENT NOTE   Patient Name: Jeffery Houston MRN: [id number] DOB:[birth year], 52 y.o., male Today's Date: 01/16/2023  PCP: Lazaro Arms NP   REFERRING PROVIDER: Renette Butters, MD  END OF SESSION:     Past Medical History:  Diagnosis Date   Arthritis    bil knees   Diabetes mellitus without complication ([place])    Exertional chest pain 01/23/2019   Exertional dyspnea 01/23/2019   Hypertension    Past Surgical History:  Procedure Laterality Date   FOOT SURGERY     PARTIAL KNEE ARTHROPLASTY Left 07/22/2015   Procedure: LEF PARTIAL KNEE REPLACEMENT ;  Surgeon: Renette Butters, MD;  Location: [place];  Service: Orthopedics;  Laterality: Left;   PARTIAL KNEE ARTHROPLASTY Right 10/05/2016   Procedure: UNICOMPARTMENTAL KNEE;  Surgeon: Renette Butters, MD;  Location: [place];  Service: Orthopedics;  Laterality: Right;  Pre/Post Op femoral nerve block   TOTAL KNEE REVISION Left 12/25/2022   Procedure: TOTAL KNEE REVISION;  Surgeon: Renette Butters, MD;  Location: [facility];  Service: Orthopedics;  Laterality: Left;   WRIST SURGERY Right    tendon repair   Patient Active Problem List   Diagnosis Date Noted   S/P total knee arthroplasty, left 12/25/2022   Lipid screening 12/21/2022   Diabetic neuropathy ([place]) 12/17/2022   Pain due to onychomycosis of toenails of both feet 12/17/2022   Statin intolerance 12/12/2022   Adverse effect of statin 12/12/2022   Acute recurrent sinusitis 12/12/2022   Hyperlipidemia 07/25/2022   Uncontrolled type 2 diabetes mellitus with hyperglycemia ([place]) 06/18/2022   Pneumonia due to COVID-19 virus 09/01/2020   Shortness of breath 01/23/2019   Laboratory examination 01/23/2019   Exertional chest pain 01/23/2019   Primary osteoarthritis of right knee 09/24/2016   Essential hypertension 09/24/2016   S/P left unicompartmental knee replacement 07/22/2015   REFERRING DIAG:    [id number]  (ICD-10-CM) - Status post left partial knee replacement   THERAPY DIAG:  No diagnosis found.  Rationale for Evaluation and Treatment Rehabilitation  PERTINENT HISTORY: Diabetes, partial knee arthroplasty  PRECAUTIONS: Other: TKA    WEIGHT BEARING RESTRICTIONS: No  SUBJECTIVE:                                                                                                                                                                                      SUBJECTIVE STATEMENT:  Pt reports he he doing the exercises. He reports a smaller car is causing more pain in the knee.    PAIN:  Are you having pain? Yes: NPRS scale: 8/10 Pain location: L knee  Pain description: sore, stiff  Aggravating factors: bending it  Relieving factors: rest, meds, elevate, ice    OBJECTIVE: (objective measures completed at initial evaluation unless otherwise dated)   DIAGNOSTIC FINDINGS:     PATIENT SURVEYS:  LEFS  05/80   COGNITION: Overall cognitive status: Within functional limits for tasks assessed                         SENSATION: Patient reports the nerve block is worn off   EDEMA:  Circumferential: Did not test today as patient has legs wrapped and was wearing thick sweatpants     POSTURE: rounded shoulders, forward head, and flexed trunk  Patient sits with left knee flexed decreased weightbearing with gait and transfers PALPATION: Pain grossly throughout left knee, medial and lateral joint line   LOWER EXTREMITY ROM:   Active ROM Right eval Left eval LEFT 01/02/23 Left  01/11/23  Hip flexion        Hip extension        Hip abduction        Hip adduction        Hip internal rotation        Hip external rotation        Knee flexion   75 AAROM   83 AAROM 92 AAROM  Knee extension   +10 AAROM    Ankle dorsiflexion        Ankle plantarflexion        Ankle inversion        Ankle eversion         (Blank rows = not tested)   With gait and transfers MMT:   MMT  Right eval Left eval  Hip flexion      Hip extension      Hip abduction      Hip adduction      Hip internal rotation      Hip external rotation      Knee flexion   3+/5  Knee extension   2/5  Ankle dorsiflexion      Ankle plantarflexion      Ankle inversion      Ankle eversion       (Blank rows = not tested)   FUNCTIONAL TESTS:  5 times sit to stand: 29 sec with walker, hands assisting from elevated mat table. 101 feet 2 min walk test  GAIT: Distance walked: 101 Assistive device utilized: Walker - 2 wheeled Level of assistance: Modified independence Comments: Decreased heel strike, knee stiffness, heavy limp      TODAY'S TREATMENT:         [facility] Adult PT Treatment:                                                DATE: 01/16/23 Therapeutic Exercise: *** Manual Therapy: *** Neuromuscular re-ed: *** Therapeutic Activity: *** Modalities: *** Self Care: ***  [facility] Adult PT Treatment:                                                DATE: 01/11/23 Therapeutic Exercise: Nustep L 5x 5 minutes Seated heel slide  Seated QS Seated LAQ x 15 Supine heel slide using sliding board SLR with strap assist  x 10 SAQ x 15 Hamstring stretch with strap  QS into towel 5 sec x 15  Seated  flexion AAROM legs crossed   [facility] Adult PT Treatment:                                                DATE: 01/02/23 Therapeutic Exercise: Nustep L3 x 5 minutes  Seated heel slide Seated QS Supine heel slide using sliding board SLR x 5 - min quad lag SAQ 5 sec x 10 Hamstring stretch with strap x 3  Supine QS into towel Seated AAROM high mat table  Seated LAQ   Therapeutic Activity: SPC training due to patient arriving with RW folded and using with RUE. Good safety 50 ft x 2 .                                                                                                                          DATE: 12/28/22  PT eval, functional testing, HEP demo and performed  x5 each   PATIENT EDUCATION:   Education details: PT/POC, HEP, AAROM  Person educated: Patient Education method: Consulting civil engineer, Demonstration, Verbal cues, and Handouts Education comprehension: verbalized understanding and needs further education   HOME EXERCISE PROGRAM: Access Code: [email or web address] URL: [email or web address] Date: 12/28/2022 Prepared by: [name]   Exercises - Supine Quad Set  - 3-5 x daily - 7 x weekly - 2 sets - 10 reps - 5 hold - Supine Heel Slide with Strap  - 3-5 x daily - 7 x weekly - 1 sets - 5-10 reps - 30 hold - Seated Long Arc Quad  - 3-5 x daily - 7 x weekly - 2 sets - 10 reps - 5 hold - Seated Hamstring Stretch with Chair  - 1 x daily - 7 x weekly - 1 sets - 5-10 reps - 30 hold - Seated Knee Flexion AAROM  - 1 x daily - 7 x weekly - 1 sets - 10 reps - 10-15 hold   ASSESSMENT:   CLINICAL IMPRESSION: Patient is a 52 y.o. male who was seen today for physical therapy treatment for L Total knee arthroplasty 12/25/22. He reports compliance with HEP and continued pain and edema. He arrives with folded RW pushing it on his right side. Began gait training with [facility] which he did well with  good safety. Min Quad lag and pt educated on need for AD due to quad weakness. Reviewed HEP and began Nustep. He declined VASO or ice pack in clinic. Wants to self Ice at home.    OBJECTIVE IMPAIRMENTS: Abnormal gait, decreased activity tolerance, decreased knowledge of use of DME, decreased mobility, difficulty walking, decreased ROM, decreased strength, hypomobility, increased edema, increased fascial restrictions, impaired flexibility, and pain.    ACTIVITY LIMITATIONS: carrying, lifting, sitting, standing, squatting, sleeping, stairs, transfers, bed mobility, dressing, locomotion level,  and caring for others   PARTICIPATION LIMITATIONS: meal prep, cleaning, laundry, interpersonal relationship, driving, shopping, community activity, occupation, and yard work   PERSONAL FACTORS: Profession and 1-2  comorbidities: previous L knee surgery, diabetes   are also affecting patient's functional outcome.    REHAB POTENTIAL: Excellent   CLINICAL DECISION MAKING: Stable/uncomplicated   EVALUATION COMPLEXITY: Low     GOALS: Goals reviewed with patient? Yes   SHORT TERM GOALS: Target date: 01/25/2023   Pt will be I with HEP for L knee ROM and strength Baseline:unknown  Goal status: INITIAL   2.  Pt will be able to transfer sit to stand without UE x 5 to his walker using LEs symmetrically in < 20 sec  Baseline: uses Rt LE, walker and UE  Goal status: INITIAL   3.  Pt will be able to increase L knee flexion to 90 with AAROM in order to improve transfers  Baseline: 74 deg with strap , AAROM  Goal status: INITIAL   4.  Pt will able to demo 4/5 quad strength in order to support knee in standing  Baseline: 2/5 Goal status: INITIAL   LONG TERM GOALS: Target date: 02/22/2023     Pt will be I with HEP for L knee ROM, strength and stability Baseline: unknown Goal status: INITIAL   2.  Pt will score LEFS to 60/80 or better as a proxy for returned function as prior to injury.  Baseline: 5/80 Goal status: INITIAL   3.  Pt will be able to walk 180 feet in 2 min or more with LRAD Baseline: 101 feet with walker  Goal status: INITIAL   4.  Pt will be able to extend knee to no more than +3 deg to normalize gait pattern Baseline: +10 deg  Goal status: INITIAL   5.  L knee will flex to 110 deg or more to improve ability to squat, lift and return to work  Baseline: 74 deg AAROM  Goal status: INITIAL   6.  Pt will be able to stand, walk as needed in the community without limitation of pain.  Baseline: limited at this time, avoids due to pain  Goal status: INITIAL     PLAN:   PT FREQUENCY: 3x/week   PT DURATION: 4 weeks then 2 x for 4 more.    PLANNED INTERVENTIONS: Therapeutic exercises, Therapeutic activity, Neuromuscular re-education, Balance training, Gait training, Patient/Family  education, Self Care, Joint mobilization, Stair training, DME instructions, Cryotherapy, Moist heat, Taping, Vasopneumatic device, Manual therapy, and Re-evaluation NMES, E-stim   PLAN FOR NEXT SESSION: check HEP, AAROM as tolerated, manual, VASO, nustep, gait    Hessie Diener, PTA 01/16/23 8:18 AM Phone: [phone] Fax: [phone]

## 2023-01-18 ENCOUNTER — Ambulatory Visit: Payer: Medicaid Other | Attending: Orthopedic Surgery | Admitting: Physical Therapy

## 2023-01-18 ENCOUNTER — Ambulatory Visit: Payer: Medicaid Other | Admitting: Physical Therapy

## 2023-01-18 ENCOUNTER — Encounter: Payer: Self-pay | Admitting: Physical Therapy

## 2023-01-18 DIAGNOSIS — R600 Localized edema: Secondary | ICD-10-CM | POA: Insufficient documentation

## 2023-01-18 DIAGNOSIS — Z96652 Presence of left artificial knee joint: Secondary | ICD-10-CM | POA: Diagnosis present

## 2023-01-18 DIAGNOSIS — M6281 Muscle weakness (generalized): Secondary | ICD-10-CM

## 2023-01-18 DIAGNOSIS — M25662 Stiffness of left knee, not elsewhere classified: Secondary | ICD-10-CM | POA: Diagnosis present

## 2023-01-18 NOTE — Therapy (Addendum)
OUTPATIENT PHYSICAL THERAPY TREATMENT NOTE DISCHARGE   Patient Name: Jeffery Houston MRN: [id number] DOB:[birth year], 52 y.o., male Today's Date: 01/18/2023  PCP: Lazaro Arms NP   REFERRING PROVIDER: Renette Butters, MD  END OF SESSION:   PT End of Session - 01/18/23 0849     Visit Number 4    Number of Visits 24    Date for PT Re-Evaluation 02/22/23    Authorization Type [place] [employer]    Authorization Time Period 12/31/22-03/30/23    Authorization - Visit Number 3    Authorization - Number of Visits 12    PT Start Time [id number]    PT Stop Time 0930    PT Time Calculation (min) 43 min    Activity Tolerance Patient limited by pain;Patient tolerated treatment well    Behavior During Therapy [facility] for tasks assessed/performed              Past Medical History:  Diagnosis Date   Arthritis    bil knees   Diabetes mellitus without complication ([place])    Exertional chest pain 01/23/2019   Exertional dyspnea 01/23/2019   Hypertension    Past Surgical History:  Procedure Laterality Date   FOOT SURGERY     PARTIAL KNEE ARTHROPLASTY Left 07/22/2015   Procedure: LEF PARTIAL KNEE REPLACEMENT ;  Surgeon: Renette Butters, MD;  Location: [place];  Service: Orthopedics;  Laterality: Left;   PARTIAL KNEE ARTHROPLASTY Right 10/05/2016   Procedure: UNICOMPARTMENTAL KNEE;  Surgeon: Renette Butters, MD;  Location: [place];  Service: Orthopedics;  Laterality: Right;  Pre/Post Op femoral nerve block   TOTAL KNEE REVISION Left 12/25/2022   Procedure: TOTAL KNEE REVISION;  Surgeon: Renette Butters, MD;  Location: [facility];  Service: Orthopedics;  Laterality: Left;   WRIST SURGERY Right    tendon repair   Patient Active Problem List   Diagnosis Date Noted   S/P total knee arthroplasty, left 12/25/2022   Lipid screening 12/21/2022   Diabetic neuropathy ([place]) 12/17/2022   Pain due to onychomycosis of toenails of both feet 12/17/2022   Statin intolerance  12/12/2022   Adverse effect of statin 12/12/2022   Acute recurrent sinusitis 12/12/2022   Hyperlipidemia 07/25/2022   Uncontrolled type 2 diabetes mellitus with hyperglycemia ([place]) 06/18/2022   Pneumonia due to COVID-19 virus 09/01/2020   Shortness of breath 01/23/2019   Laboratory examination 01/23/2019   Exertional chest pain 01/23/2019   Primary osteoarthritis of right knee 09/24/2016   Essential hypertension 09/24/2016   S/P left unicompartmental knee replacement 07/22/2015   REFERRING DIAG:    [id number] (ICD-10-CM) - Status post left partial knee replacement   THERAPY DIAG:  Stiffness of left knee, not elsewhere classified  Localized edema  Total knee replacement status, left  Muscle weakness (generalized)  Rationale for Evaluation and Treatment Rehabilitation  PERTINENT HISTORY: Diabetes, partial knee arthroplasty  PRECAUTIONS: Other: TKA    WEIGHT BEARING RESTRICTIONS: No  SUBJECTIVE:  SUBJECTIVE STATEMENT:  Pt without pain right now.  He drove a lot yesterday.  Missed appts x 2 this week.     PAIN:  Are you having pain? Yes: NPRS scale: 0/10 Pain location: L knee  Pain description: sore, stiff  Aggravating factors: bending it  Relieving factors: rest, meds, elevate, ice    OBJECTIVE: (objective measures completed at initial evaluation unless otherwise dated)   DIAGNOSTIC FINDINGS:     PATIENT SURVEYS:  LEFS  05/80   COGNITION: Overall cognitive status: Within functional limits for tasks assessed                         SENSATION: Patient reports the nerve block is worn off   EDEMA:  Circumferential: Did not test today as patient has legs wrapped and was wearing thick sweatpants     POSTURE: rounded shoulders, forward head, and flexed trunk  Patient sits with left  knee flexed decreased weightbearing with gait and transfers PALPATION: Pain grossly throughout left knee, medial and lateral joint line   LOWER EXTREMITY ROM:   Active ROM Right eval Left eval LEFT 01/02/23 Left  01/11/23 Lt.  01/18/23  Hip flexion         Hip extension         Hip abduction         Hip adduction         Hip internal rotation         Hip external rotation         Knee flexion   75 AAROM   83 AAROM 92 AAROM 110 AROM   Knee extension   +10 AAROM     Ankle dorsiflexion         Ankle plantarflexion         Ankle inversion         Ankle eversion          (Blank rows = not tested)   With gait and transfers MMT:   MMT Right eval Left eval Lt.  01/18/23  Hip flexion       Hip extension       Hip abduction       Hip adduction       Hip internal rotation       Hip external rotation       Knee flexion   3+/5 4/5  Knee extension   2/5 4-/5  Ankle dorsiflexion       Ankle plantarflexion       Ankle inversion       Ankle eversion        (Blank rows = not tested)   FUNCTIONAL TESTS:  5 times sit to stand: 29 sec with walker, hands assisting from elevated mat table. 101 feet 2 min walk test  GAIT: Distance walked: 101 Assistive device utilized: Walker - 2 wheeled Level of assistance: Modified independence Comments: Decreased heel strike, knee stiffness, heavy limp      TODAY'S TREATMENT:         [facility] Adult PT Treatment:                                                DATE: 01/18/23 Therapeutic Exercise: Nustep L 6 UE and LE  Seated LAQ x 15 Added green band LAQ and hamstring curl x  15  Supine heel slide x 10 AAROM  SLR  x 10 x 2 sets  Hamstring stretch with strap  Bridge x 10 x 2  Wall sit 20- 30 sec x 3 mid range  Wall squats mid range x 15 , leans to R   Self Care: Need to stretch and work strength vs just walk ad bear weight on it Ice for warmth, inflammation especially after exercises   [facility] Adult PT Treatment:                                                 DATE: 01/11/23 Therapeutic Exercise: Nustep L 5x 5 minutes Seated heel slide  Seated QS Seated LAQ x 15 Supine heel slide using sliding board SLR with strap assist x 10 SAQ x 15 Hamstring stretch with strap  QS into towel 5 sec x 15  Seated  flexion AAROM legs crossed   [facility] Adult PT Treatment:                                                DATE: 01/02/23 Therapeutic Exercise: Nustep L3 x 5 minutes  Seated heel slide Seated QS Supine heel slide using sliding board SLR x 5 - min quad lag SAQ 5 sec x 10 Hamstring stretch with strap x 3  Supine QS into towel Seated AAROM high mat table  Seated LAQ   Therapeutic Activity: SPC training due to patient arriving with RW folded and using with RUE. Good safety 50 ft x 2 .                                                                                                                          DATE: 12/28/22  PT eval, functional testing, HEP demo and performed  x5 each   PATIENT EDUCATION:  Education details: PT/POC, HEP, AAROM  Person educated: Patient Education method: Consulting civil engineer, Demonstration, Verbal cues, and Handouts Education comprehension: verbalized understanding and needs further education   HOME EXERCISE PROGRAM: Access Code: [email or web address] URL: [email or web address] Date: 12/28/2022 Prepared by: [name] ASSESSMENT:   CLINICAL IMPRESSION: Patient has increased his L knee ROM to 110 deg despite missing a couple of visits.  He is showing some improvements in tolerance for standing exercise.  He continues to walk with a significant limp.  Exercises for AAROM did increase his pain to a degree.  Due to attendance policy he will be asked to make 1 appt at time.  Declined ice post session.     OBJECTIVE IMPAIRMENTS: Abnormal gait, decreased activity tolerance, decreased knowledge of use of DME, decreased mobility, difficulty walking, decreased ROM, decreased strength, hypomobility, increased edema,  increased fascial restrictions, impaired flexibility, and pain.  ACTIVITY LIMITATIONS: carrying, lifting, sitting, standing, squatting, sleeping, stairs, transfers, bed mobility, dressing, locomotion level, and caring for others   PARTICIPATION LIMITATIONS: meal prep, cleaning, laundry, interpersonal relationship, driving, shopping, community activity, occupation, and yard work   PERSONAL FACTORS: Profession and 1-2 comorbidities: previous L knee surgery, diabetes   are also affecting patient's functional outcome.    REHAB POTENTIAL: Excellent   CLINICAL DECISION MAKING: Stable/uncomplicated   EVALUATION COMPLEXITY: Low     GOALS: Goals reviewed with patient? Yes   SHORT TERM GOALS: Target date: 01/25/2023   Pt will be I with HEP for L knee ROM and strength Baseline:unknown , needs cues  Goal status: ongoing    2.  Pt will be able to transfer sit to stand without UE x 5 to his walker using LEs symmetrically in < 20 sec  Baseline: uses Rt LE, walker and UE .  Can do this without UE x 16 sec  Goal status: met    3.  Pt will be able to increase L knee flexion to 90 with AAROM in order to improve transfers  Baseline: 74 deg with strap , AAROM to 110 deg  Goal status: met    4.  Pt will able to demo 4/5 quad strength in order to support knee in standing  Baseline: 2/5, 4-/4 Goal status:met     LONG TERM GOALS: Target date: 02/22/2023     Pt will be I with HEP for L knee ROM, strength and stability Baseline: unknown Goal status: INITIAL   2.  Pt will score LEFS to 60/80 or better as a proxy for returned function as prior to injury.  Baseline: 5/80 Goal status: INITIAL   3.  Pt will be able to walk 180 feet in 2 min or more with LRAD Baseline: 101 feet with walker  Goal status: INITIAL   4.  Pt will be able to extend knee to no more than +3 deg to normalize gait pattern Baseline: +10 deg  Goal status: INITIAL   5.  L knee will flex to 110 deg or more to improve ability  to squat, lift and return to work  Baseline: 74 deg AAROM  Goal status: INITIAL   6.  Pt will be able to stand, walk as needed in the community without limitation of pain.  Baseline: limited at this time, avoids due to pain  Goal status: INITIAL     PLAN:   PT FREQUENCY: 3x/week   PT DURATION: 4 weeks then 2 x for 4 more.    PLANNED INTERVENTIONS: Therapeutic exercises, Therapeutic activity, Neuromuscular re-education, Balance training, Gait training, Patient/Family education, Self Care, Joint mobilization, Stair training, DME instructions, Cryotherapy, Moist heat, Taping, Vasopneumatic device, Manual therapy, and Re-evaluation NMES, E-stim   PLAN FOR NEXT SESSION: check HEP, AAROM as tolerated, manual, VASO, nustep, gait   [name], PT 01/18/23 9:31 AM Phone: [phone] Fax: [phone]   PHYSICAL THERAPY DISCHARGE SUMMARY  Visits from Start of Care: 4  Current functional level related to goals / functional outcomes: NA   Remaining deficits: Unknown    Education / Equipment: Unknown    Patient agrees to discharge. Patient goals were not met. Patient is being discharged due to not returning since the last visit.  [name], PT 02/20/23 2:59 PM Phone: [phone] Fax: [phone]

## 2023-01-22 ENCOUNTER — Other Ambulatory Visit: Payer: Medicaid Other | Admitting: Pharmacist

## 2023-01-22 ENCOUNTER — Other Ambulatory Visit: Payer: Self-pay | Admitting: Nurse Practitioner

## 2023-01-22 DIAGNOSIS — E1165 Type 2 diabetes mellitus with hyperglycemia: Secondary | ICD-10-CM

## 2023-01-22 MED ORDER — METFORMIN HCL 500 MG PO TABS: 500.0000 mg | ORAL_TABLET | Freq: Every day | ORAL | 3 refills | Status: DC

## 2023-01-22 NOTE — Progress Notes (Signed)
01/22/2023 Name: Jeffery Houston MRN: [id number] DOB: 01-Mar-1971  Chief Complaint  Patient presents with   Medication Management   Diabetes   Hypertension    Jeffery Houston is a 52 y.o. year old male who presented for a telephone visit.   They were referred to the pharmacist by their PCP for assistance in managing diabetes and hypertension.   Subjective:  Care Team: Primary Care Provider: [name], NP ; Next Scheduled Visit: 06/21/23  Medication Access/Adherence  [facility] Pharmacy[facility] [place], [place] - [address] [place] [address] Phone: [phone] Fax: [phone]   Patient reports affordability concerns with their medications: No  Patient reports access/transportation concerns to their pharmacy: No  Patient reports adherence concerns with their medications:  No     Diabetes:  Current medications: metformin 500 mg daily, Ozempic 1 mg weekly, Levemir 8 units PRN - has not been using very often recently  Reports readings remain controlled at least 70% of the time  Patient denies hypoglycemic s/sx including dizziness, shakiness, sweating.   Hypertension:  Current medications: lisinopril 40 mg daily, amlodipine 10 mg daily  Patient has a validated, automated, upper arm home BP cuff Current blood pressure readings readings: does not remember, but did write down readings.   Patient denies hypotensive s/sx including dizziness, lightheadedness.  Patient denies hypertensive symptoms including headache, chest pain, shortness of breath  Hyperlipidemia/ASCVD Risk Reduction   Current lipid lowering medications: ezetimibe 10 mg daily ; Repatha discussed with cardiology in December and plan to pursue initiation after upcoming knee replacement Medications tried in the past: previously told me lovastatin, but now tells me the prior statin was 1 mg - suspect Livalo; rosuvastatin - reports muscle cramps with both;     Antiplatelet regimen: aspirin 81 mg daily   Objective:  Lab Results  Component Value Date   HGBA1C 6.1 (H) 12/12/2022    Lab Results  Component Value Date   CREATININE 0.86 12/21/2022   BUN 15 12/21/2022   NA 142 12/21/2022   K 4.5 12/21/2022   CL 105 12/21/2022   CO2 23 12/21/2022    Lab Results  Component Value Date   CHOL 179 12/21/2022   HDL 41 12/21/2022   LDLCALC 116 (H) 12/21/2022   TRIG 122 12/21/2022   CHOLHDL 4.4 12/21/2022    Medications Reviewed Today     Reviewed by Osker Mason, RPH-CPP (Pharmacist) on 01/22/23 at 1326  Med List Status: <None>   Medication Order Taking? Sig Documenting Provider Last Dose Status Informant  ACCU-CHEK GUIDE test strip [id number]  use test strips 3 times daily [name], NP  Active   acetaminophen (TYLENOL) 500 MG tablet [id number]  Take 2 tablets (1,000 mg total) by mouth every 6 (six) hours as needed for mild pain or moderate pain. Aggie Moats M, PA-C  Active   amLODipine (NORVASC) 10 MG tablet [id number] Yes Take 1 tablet by mouth daily. [name], NP Taking Active   aspirin EC 81 MG tablet [id number] Yes Take 1 tablet (81 mg total) by mouth 2 (two) times daily. To prevent blood clots for 30 days after surgery. Aggie Moats M, PA-C Taking Active   blood glucose meter kit and supplies KIT [id number]  Dispense based on patient and insurance preference. Use up to four times daily as directed. Vevelyn Francois, NP  Active   cefadroxil (DURICEF) 500 MG capsule [id number] Yes Take 500 mg by mouth  2 (two) times daily. [provider]  Active   Continuous Blood Gluc Sensor (DEXCOM G7 SENSOR) [place] [id number] Yes Use to check glucose continously [name], NP Taking Active   diclofenac (VOLTAREN) 50 MG EC tablet [id number] Yes TAKE 1 TABLET BY MOUTH 2 TIMES DAILY [name], NP Taking Active   EPINEPHrine 0.3 mg/0.3 mL IJ SOAJ injection [id number]  Inject 0.3 mg into the muscle as needed for  anaphylaxis. [name], NP  Active            Med Note Elyse Jarvis   Wed Dec 12, 2022  8:53 AM) Prn   esomeprazole (NEXIUM) 40 MG capsule [id number] Yes Take 1 capsule (40 mg total) by mouth daily as needed (acid reflux).  Patient taking differently: Take 40 mg by mouth daily before breakfast.   [name], NP Taking Active   ezetimibe (ZETIA) 10 MG tablet [id number] Yes Take 1 tablet by mouth daily. [name], NP Taking Active   fluticasone (FLONASE) 50 MCG/ACT nasal spray [id number] Yes Place 2 sprays into both nostrils daily. Renee Rival, FNP Taking Active   gabapentin (NEURONTIN) 300 MG capsule [id number] Yes Take 1 capsule by mouth 3 times daily. [name], NP Taking Active            Med Note Jodi Mourning, Terrin Meddaugh T   Tue Jan 22, 2023 11:41 AM)    lisinopril (ZESTRIL) 40 MG tablet [id number] Yes Take 1 tablet (40 mg total) by mouth every morning. Rex Kras, DO Taking Active   metFORMIN (GLUCOPHAGE) 500 MG tablet [id number] Yes Take 1 tablet by mouth 2 times daily with a meal. [name], NP Taking Active            Med Note Jodi Mourning, Aleph Nickson T   Tue Jan 22, 2023 11:41 AM) 500 mg daily  methocarbamol (ROBAXIN-750) 750 MG tablet [id number] Yes Take 1 tablet (750 mg total) by mouth every 8 (eight) hours as needed for muscle spasms. Aggie Moats M, PA-C Taking Active   oxyCODONE (ROXICODONE) 5 MG immediate release tablet [id number] Yes Take 1 tablet (5 mg total) by mouth every 4 (four) hours as needed for severe pain. Aggie Moats M, PA-C Taking Active   polyethylene glycol powder (MIRALAX) 17 GM/SCOOP powder [id number] Yes Dissolve 17 g in 4 oz of water or juice and drink by mouth daily. Britt Bottom, PA-C Taking Active   Semaglutide, 1 MG/DOSE, 4 MG/3ML SOPN [id number] Yes Inject 1 mg as directed once a week. [name], NP Taking Active   sildenafil (VIAGRA) 25 MG tablet [id number]  Take 1 tablet (25 mg total) by mouth daily as needed for erectile  dysfunction. [name], NP  Active   sodium chloride (OCEAN) 0.65 % nasal spray [id number] Yes Place 1 spray into the nose as needed for congestion. Renee Rival, FNP Taking Active   VENTOLIN HFA 108 (90 Base) MCG/ACT inhaler [id number] Yes Inhale 2 puffs into the lungs every 6 hours as needed for wheezing or shortness of breath. Teena Dunk, NP Taking Active   Med List Note Vevelyn Francois, [place] 10/17/20 1436):                Assessment/Plan:   Diabetes: - Currently controlled - Recommend to continue current regimen. Given Levemir discontinuation and patient's infrequent use, recommend to stop medication. Recommend to continue metformin at current dose of 500 mg daily and Ozempic 1 mg weekly. Discussed with  patient that he does not feel like he needs to take PRN Levemir for elevated sugars. He verbalizes understanding.     Hypertension: - Currently controlled - Reviewed long term cardiovascular and renal outcomes of uncontrolled blood pressure - Reviewed appropriate blood pressure monitoring technique and reviewed goal blood pressure. Recommended to check home blood pressure and heart rate periodically.  - Patient will send me MyChart with recent readings - Recommend to continue current regimen  Hyperlipidemia/ASCVD Risk Reduction: - Currently uncontrolled.  - Recommend to continue ezetimibe; follow up with cardiology for non-statin therapies as scheduled   Follow Up Plan: phone call in ~ 6 weeks  Catie Hedwig Morton, PharmD, [place], [facility] [phone]

## 2023-01-31 ENCOUNTER — Other Ambulatory Visit: Payer: Self-pay | Admitting: Nurse Practitioner

## 2023-02-07 ENCOUNTER — Ambulatory Visit: Payer: Medicaid Other | Admitting: Cardiology

## 2023-02-12 ENCOUNTER — Other Ambulatory Visit: Payer: Medicaid Other

## 2023-02-26 ENCOUNTER — Other Ambulatory Visit: Payer: Medicaid Other | Admitting: Pharmacist

## 2023-02-26 DIAGNOSIS — I10 Essential (primary) hypertension: Secondary | ICD-10-CM

## 2023-02-26 DIAGNOSIS — E1165 Type 2 diabetes mellitus with hyperglycemia: Secondary | ICD-10-CM

## 2023-02-26 MED ORDER — OZEMPIC (1 MG/DOSE) 4 MG/3ML ~~LOC~~ SOPN: 1.0000 mg | PEN_INJECTOR | SUBCUTANEOUS | 1 refills | Status: AC

## 2023-02-26 MED ORDER — ROSUVASTATIN CALCIUM 5 MG PO TABS: 5.0000 mg | ORAL_TABLET | ORAL | 1 refills | Status: DC

## 2023-02-26 MED ORDER — METFORMIN HCL 500 MG PO TABS: 500.0000 mg | ORAL_TABLET | Freq: Two times a day (BID) | ORAL | 1 refills | Status: AC

## 2023-02-26 MED ORDER — LISINOPRIL 40 MG PO TABS: 40.0000 mg | ORAL_TABLET | Freq: Every morning | ORAL | 1 refills | Status: AC

## 2023-02-26 NOTE — Patient Instructions (Signed)
[place],   Let's try rosuvastatin 5 mg every other day and see how you feel.   Please reach out with any questions or concerns.   Thanks!  Catie Eppie Gibson, PharmD, BCACP, CPP [facility] [phone]

## 2023-02-26 NOTE — Progress Notes (Signed)
02/26/2023 Name: Jeffery Houston MRN: [id number] DOB: 04/06/1971  Chief Complaint  Patient presents with   Medication Management   Diabetes   Hypertension   Hyperlipidemia    Jeffery Houston is a 52 y.o. year old male who presented for a telephone visit.   They were referred to the pharmacist by their PCP for assistance in managing diabetes, hypertension, and hyperlipidemia.   Patient is participating in a Managed Medicaid Plan:  Yes  Subjective:  Care Team: Primary Care Provider: Ivonne Andrew, NP ; Next Scheduled Visit: 07/18/23  Medication Access/Adherence  Current Pharmacy:  [facility] - [place], [place] - [address] [place] [address] Phone: [phone] Fax: [phone]   Patient reports affordability concerns with their medications: No  Patient reports access/transportation concerns to their pharmacy: No  Patient reports adherence concerns with their medications:  No    Reports he is working on applying for disability. He also notes he received information in the mail that he no longer had Medicaid. He does appear to have Medicaid per current insurance. Discussed connecting with clinic SW for access/insurance/financial support.  Diabetes:  Current medications: Ozempic 1 mg daily, metformin 500 mg twice daily  Current glucose readings: using DexCom, reports readings continue to be at goal >70% of the time  Patient denies hypoglycemic s/sx including dizziness, shakiness, sweating. Patient denies hyperglycemic symptoms including polyuria, polydipsia, polyphagia, nocturia, neuropathy, blurred vision.  Hypertension:  Current medications: lisinopril 40 mg daily, amlodipine 10 mg daily  Hyperlipidemia/ASCVD Risk Reduction  Current lipid lowering medications: ezetimibe 10 mg daily Medications tried in the past: reports prior intolerances to rosuvastatin, atorvastatin, and pitavastatin that resulted in severe muscle aches/cramps.    PREVENT Risk Score: 10 year risk of CVD: 6.1% - 10 year risk of ASCVD: 3.8% - 10 year risk of HF: 3.4%  Unable to afford CAC score as not covered by insurance .  Objective:  Lab Results  Component Value Date   HGBA1C 6.1 (H) 12/12/2022    Lab Results  Component Value Date   CREATININE 0.86 12/21/2022   BUN 15 12/21/2022   NA 142 12/21/2022   K 4.5 12/21/2022   CL 105 12/21/2022   CO2 23 12/21/2022    Lab Results  Component Value Date   CHOL 179 12/21/2022   HDL 41 12/21/2022   LDLCALC 116 (H) 12/21/2022   TRIG 122 12/21/2022   CHOLHDL 4.4 12/21/2022    Medications Reviewed Today     Reviewed by Alden Hipp, RPH-CPP (Pharmacist) on 01/22/23 at 1326  Med List Status: <None>   Medication Order Taking? Sig Documenting Provider Last Dose Status Informant  ACCU-CHEK GUIDE test strip [id number]  use test strips 3 times daily Ivonne Andrew, NP  Active   acetaminophen (TYLENOL) 500 MG tablet [id number]  Take 2 tablets (1,000 mg total) by mouth every 6 (six) hours as needed for mild pain or moderate pain. Levester Fresh M, PA-C  Active   amLODipine (NORVASC) 10 MG tablet [id number] Yes Take 1 tablet by mouth daily. Ivonne Andrew, NP Taking Active   aspirin EC 81 MG tablet [id number] Yes Take 1 tablet (81 mg total) by mouth 2 (two) times daily. To prevent blood clots for 30 days after surgery. Levester Fresh M, PA-C Taking Active   blood glucose meter kit and supplies KIT [id number]  Dispense based on patient and insurance preference. Use up to four times daily as directed. Barbette Merino, NP  Active   cefadroxil (DURICEF) 500 MG capsule [id number] Yes Take 500 mg by mouth 2 (two) times daily. [provider]  Active   Continuous Blood Gluc Sensor (DEXCOM G7 SENSOR) MISC [id number] Yes Use to check glucose continously Ivonne Andrew, NP Taking Active   diclofenac (VOLTAREN) 50 MG EC tablet [id number] Yes TAKE 1 TABLET BY MOUTH 2 TIMES DAILY Ivonne Andrew, NP  Taking Active   EPINEPHrine 0.3 mg/0.3 mL IJ SOAJ injection [id number]  Inject 0.3 mg into the muscle as needed for anaphylaxis. Ivonne Andrew, NP  Active            Med Note Renelda Loma   Wed Dec 12, 2022  8:53 AM) Prn   esomeprazole (NEXIUM) 40 MG capsule [id number] Yes Take 1 capsule (40 mg total) by mouth daily as needed (acid reflux).  Patient taking differently: Take 40 mg by mouth daily before breakfast.   Ivonne Andrew, NP Taking Active   ezetimibe (ZETIA) 10 MG tablet [id number] Yes Take 1 tablet by mouth daily. Ivonne Andrew, NP Taking Active   fluticasone (FLONASE) 50 MCG/ACT nasal spray [id number] Yes Place 2 sprays into both nostrils daily. Donell Beers, FNP Taking Active   gabapentin (NEURONTIN) 300 MG capsule [id number] Yes Take 1 capsule by mouth 3 times daily. Ivonne Andrew, NP Taking Active            Med Note Clearance Coots, Melane Windholz T   Tue Jan 22, 2023 11:41 AM)    lisinopril (ZESTRIL) 40 MG tablet [id number] Yes Take 1 tablet (40 mg total) by mouth every morning. Tessa Lerner, DO Taking Active   metFORMIN (GLUCOPHAGE) 500 MG tablet [id number] Yes Take 1 tablet by mouth 2 times daily with a meal. Ivonne Andrew, NP Taking Active            Med Note Clearance Coots, Elisea Khader T   Tue Jan 22, 2023 11:41 AM) 500 mg daily  methocarbamol (ROBAXIN-750) 750 MG tablet [id number] Yes Take 1 tablet (750 mg total) by mouth every 8 (eight) hours as needed for muscle spasms. Levester Fresh M, PA-C Taking Active   oxyCODONE (ROXICODONE) 5 MG immediate release tablet [id number] Yes Take 1 tablet (5 mg total) by mouth every 4 (four) hours as needed for severe pain. Levester Fresh M, PA-C Taking Active   polyethylene glycol powder (MIRALAX) 17 GM/SCOOP powder [id number] Yes Dissolve 17 g in 4 oz of water or juice and drink by mouth daily. Jenne Pane, PA-C Taking Active   Semaglutide, 1 MG/DOSE, 4 MG/3ML SOPN [id number] Yes Inject 1 mg as directed once a week. Ivonne Andrew, NP Taking  Active   sildenafil (VIAGRA) 25 MG tablet [id number]  Take 1 tablet (25 mg total) by mouth daily as needed for erectile dysfunction. Ivonne Andrew, NP  Active   sodium chloride (OCEAN) 0.65 % nasal spray [id number] Yes Place 1 spray into the nose as needed for congestion. Donell Beers, FNP Taking Active   VENTOLIN HFA 108 (90 Base) MCG/ACT inhaler [id number] Yes Inhale 2 puffs into the lungs every 6 hours as needed for wheezing or shortness of breath. Kathrynn Speed, NP Taking Active   Med List Note Barbette Merino, [place] 10/17/20 1436):                Assessment/Plan:   Diabetes: - Currently controlled - Reviewed goal A1c, goal fasting, and goal 2 hour post prandial glucose - Recommend to continue current regimen at  this time    Hypertension: - Currently controlled - Reviewed appropriate blood pressure monitoring technique and reviewed goal blood pressure. Recommended to check home blood pressure and heart rate peroidically - Recommend to continue current regimen at this time. Requests refill on lisinopril. Collaborated with PCP.     Hyperlipidemia/ASCVD Risk Reduction: - Currently uncontrolled.  - Reviewed long term complications of uncontrolled cholesterol - Discussed PCSK9i as previously discussed. Per [place] Medicaid criteria, patient needs to have tried and failed low dose rosuvastatin and atorvastatin for coverage of PCSK9i. Discussed with patient. He is amenable to trying low dose rosuvastatin 5 mg every other day and follow up in 4 weeks to discuss tolerability. Will discuss with PCP.   Follow Up Plan: phone call in 4 weeks  Patient also reports recent concerns with numbness and back pain. Requests support scheduling a visit with PCP for discussion. Scheduled next week  Catie Eppie Gibson. Wilmina Maxham, PharmD, BCACP, CPP [facility] [phone]

## 2023-02-28 ENCOUNTER — Telehealth: Payer: Self-pay | Admitting: Clinical

## 2023-02-28 NOTE — Telephone Encounter (Signed)
Integrated Behavioral Health Referral Note  02/28/2023 Name: Jeffery Houston MRN: [id number] DOB: [birth year] Jeffery Houston is a 52 y.o. year old male who sees Ivonne Andrew, NP for primary care. LCSW was consulted to assess patient's needs and assist the patient with [employer] .  Interpreter: No.   Interpreter Name & Language: none  Assessment: Patient has pending disability claim and questions about his Medicaid.  Intervention: Patient was referred by pharmacist, Catie, for help with disability claim and Medicaid questions. CSW called patient to follow up. Patient indicated he received an automated call stating his Medicaid was ending, but he has not encountered any issues with getting his meds filled or anything. He suspects now that the call may have been a scam.   Patient also has submitted a disability claim. He indicated he received paperwork recently that he needs to complete and send back to [facility]. Advised patient that he should return any paperwork or calls to [facility] promptly through this process. Also advised that if he receives a denial, can refer him to Legal Aid for help with an appeal.  SDOH (Social Determinants of Health) assessments performed: No  Review of patient status, including review of consultants reports, relevant laboratory and other test results, and collaboration with appropriate care team members and the patient's provider was performed as part of comprehensive patient evaluation and provision of services.    Abigail Butts, LCSW [facility] [phone]

## 2023-03-06 ENCOUNTER — Ambulatory Visit (INDEPENDENT_AMBULATORY_CARE_PROVIDER_SITE_OTHER): Payer: Medicaid Other | Admitting: Nurse Practitioner

## 2023-03-06 ENCOUNTER — Encounter: Payer: Self-pay | Admitting: Nurse Practitioner

## 2023-03-06 VITALS — BP 121/80 | HR 85 | Temp 97.6°F | Ht 76.5 in | Wt 246.6 lb

## 2023-03-06 DIAGNOSIS — G8929 Other chronic pain: Secondary | ICD-10-CM | POA: Diagnosis not present

## 2023-03-06 DIAGNOSIS — M2550 Pain in unspecified joint: Secondary | ICD-10-CM | POA: Diagnosis not present

## 2023-03-06 MED ORDER — CELECOXIB 200 MG PO CAPS: 200.0000 mg | ORAL_CAPSULE | Freq: Two times a day (BID) | ORAL | 2 refills | Status: DC

## 2023-03-06 NOTE — Progress Notes (Signed)
  ID: Jeffery Houston, male    DOB: 11-01-1971, 52 y.o.   MRN: [id number]  Chief Complaint  Patient presents with   Consult    Wants a referral to pain mang    Referring provider: Ivonne Andrew, NP   HPI  Patient presents today for follow-up on chronic pain.  Patient does have multiple joint pain.  We did check inflammatory markers at last visit which were all negative.  Patient has tried Voltaren and states that this has not helped.  We will trial Celebrex.  We will refer patient to pain management per his request. Denies f/c/s, n/v/d, hemoptysis, PND, leg swelling Denies chest pain or edema      Allergies  Allergen Reactions   Bee Venom Anaphylaxis   Crestor [Rosuvastatin Calcium] Other (See Comments)    Myalgias   Latex Hives    Immunization History  Administered Date(s) Administered   Tdap 04/11/2013, 05/26/2019    Past Medical History:  Diagnosis Date   Arthritis    bil knees   Diabetes mellitus without complication    Exertional chest pain 01/23/2019   Exertional dyspnea 01/23/2019   Hypertension     Tobacco History: Social History   Tobacco Use  Smoking Status Former   Types: Cigars   Quit date: 12/04/2022   Years since quitting: 0.2  Smokeless Tobacco Former  Tobacco Comments   Quit smoking cigarettes in 2011, now smokes cigars socially.   Counseling given: Not Answered Tobacco comments: Quit smoking cigarettes in 2011, now smokes cigars socially.   Outpatient Encounter Medications as of 03/06/2023  Medication Sig   ACCU-CHEK GUIDE test strip use test strips 3 times daily   acetaminophen (TYLENOL) 500 MG tablet Take 2 tablets (1,000 mg total) by mouth every 6 (six) hours as needed for mild pain or moderate pain.   amLODipine (NORVASC) 10 MG tablet Take 1 tablet by mouth daily.   aspirin EC 81 MG tablet Take 1 tablet (81 mg total) by mouth 2 (two) times daily. To prevent blood clots for 30 days after surgery.   blood glucose meter kit and  supplies KIT Dispense based on patient and insurance preference. Use up to four times daily as directed.   cefadroxil (DURICEF) 500 MG capsule Take 500 mg by mouth 2 (two) times daily.   celecoxib (CELEBREX) 200 MG capsule Take 1 capsule (200 mg total) by mouth 2 (two) times daily.   Continuous Blood Gluc Sensor (DEXCOM G7 SENSOR) MISC Use to check glucose continously   esomeprazole (NEXIUM) 40 MG capsule Take 1 capsule by mouth daily as needed (acid reflux).   ezetimibe (ZETIA) 10 MG tablet Take 1 tablet by mouth daily.   fluticasone (FLONASE) 50 MCG/ACT nasal spray Place 2 sprays into both nostrils daily.   gabapentin (NEURONTIN) 300 MG capsule Take 1 capsule by mouth 3 times daily.   lisinopril (ZESTRIL) 40 MG tablet Take 1 tablet (40 mg total) by mouth every morning.   metFORMIN (GLUCOPHAGE) 500 MG tablet Take 1 tablet (500 mg total) by mouth 2 (two) times daily with a meal.   polyethylene glycol powder (MIRALAX) 17 GM/SCOOP powder Dissolve 17 g in 4 oz of water or juice and drink by mouth daily.   rosuvastatin (CRESTOR) 5 MG tablet Take 1 tablet (5 mg total) by mouth every other day.   Semaglutide, 1 MG/DOSE, (OZEMPIC, 1 MG/DOSE,) 4 MG/3ML SOPN Inject 1 mg into the skin once a week.   sildenafil (VIAGRA) 25 MG tablet  Take 1 tablet (25 mg total) by mouth daily as needed for erectile dysfunction.   sodium chloride (OCEAN) 0.65 % nasal spray Place 1 spray into the nose as needed for congestion.   VENTOLIN HFA 108 (90 Base) MCG/ACT inhaler Inhale 2 puffs into the lungs every 6 hours as needed for wheezing or shortness of breath.   [DISCONTINUED] diclofenac (VOLTAREN) 50 MG EC tablet TAKE 1 TABLET BY MOUTH 2 TIMES DAILY   EPINEPHrine 0.3 mg/0.3 mL IJ SOAJ injection Inject 0.3 mg into the muscle as needed for anaphylaxis. (Patient not taking: Reported on 03/06/2023)   methocarbamol (ROBAXIN-750) 750 MG tablet Take 1 tablet (750 mg total) by mouth every 8 (eight) hours as needed for muscle spasms.  (Patient not taking: Reported on 03/06/2023)   oxyCODONE (ROXICODONE) 5 MG immediate release tablet Take 1 tablet (5 mg total) by mouth every 4 (four) hours as needed for severe pain. (Patient not taking: Reported on 03/06/2023)   No facility-administered encounter medications on file as of 03/06/2023.     Review of Systems  Review of Systems  Constitutional: Negative.   HENT: Negative.    Cardiovascular: Negative.   Gastrointestinal: Negative.   Musculoskeletal:  Positive for arthralgias and myalgias.  Allergic/Immunologic: Negative.   Neurological: Negative.   Psychiatric/Behavioral: Negative.         Physical Exam  BP 121/80   Pulse 85   Temp 97.6 F (36.4 C)   Ht 6' 4.5" (1.943 m)   Wt 246 lb 9.6 oz (111.9 kg)   SpO2 100%   BMI 29.63 kg/m   Wt Readings from Last 5 Encounters:  03/06/23 246 lb 9.6 oz (111.9 kg)  12/25/22 267 lb 9.6 oz (121.4 kg)  12/21/22 267 lb 9.6 oz (121.4 kg)  12/12/22 261 lb (118.4 kg)  11/15/22 273 lb 12.8 oz (124.2 kg)     Physical Exam Vitals and nursing note reviewed.  Constitutional:      General: He is not in acute distress.    Appearance: He is well-developed.  Cardiovascular:     Rate and Rhythm: Normal rate and regular rhythm.  Pulmonary:     Effort: Pulmonary effort is normal.     Breath sounds: Normal breath sounds.  Skin:    General: Skin is warm and dry.  Neurological:     Mental Status: He is alert and oriented to person, place, and time.      Lab Results:  CBC    Component Value Date/Time   WBC 9.5 12/12/2022 1136   RBC 4.49 12/12/2022 1136   HGB 13.8 12/12/2022 1136   HGB 13.8 10/17/2020 1507   HCT 43.3 12/12/2022 1136   HCT 40.4 10/17/2020 1507   PLT 374 12/12/2022 1136   PLT 395 10/17/2020 1507   MCV 96.4 12/12/2022 1136   MCV 93 10/17/2020 1507   MCH 30.7 12/12/2022 1136   MCHC 31.9 12/12/2022 1136   RDW 11.8 12/12/2022 1136   RDW 11.5 (L) 10/17/2020 1507   LYMPHSABS 2.0 11/21/2021 0956    LYMPHSABS 2.6 10/17/2020 1507   MONOABS 0.9 11/21/2021 0956   EOSABS 0.1 11/21/2021 0956   EOSABS 0.1 10/17/2020 1507   BASOSABS 0.1 11/21/2021 0956   BASOSABS 0.0 10/17/2020 1507    BMET    Component Value Date/Time   NA 142 12/21/2022 0920   K 4.5 12/21/2022 0920   CL 105 12/21/2022 0920   CO2 23 12/21/2022 0920   GLUCOSE 146 (H) 12/21/2022 0920   GLUCOSE 138 (  H) 12/12/2022 1136   BUN 15 12/21/2022 0920   CREATININE 0.86 12/21/2022 0920   CALCIUM 9.5 12/21/2022 0920   GFRNONAA >60 12/12/2022 1136   GFRAA 111 10/17/2020 1507      Assessment & Plan:   Chronic pain of multiple joints - Ambulatory referral to Physical Medicine Rehab - celecoxib (CELEBREX) 200 MG capsule; Take 1 capsule (200 mg total) by mouth 2 (two) times daily.  Dispense: 60 capsule; Refill: 2   Follow up:  Follow up in 3 months     Ivonne Andrew, NP 03/06/2023

## 2023-03-06 NOTE — Assessment & Plan Note (Signed)
-   Ambulatory referral to Physical Medicine Rehab - celecoxib (CELEBREX) 200 MG capsule; Take 1 capsule (200 mg total) by mouth 2 (two) times daily.  Dispense: 60 capsule; Refill: 2   Follow up:  Follow up in 3 months

## 2023-03-06 NOTE — Patient Instructions (Signed)
1. Chronic pain of multiple joints  - Ambulatory referral to Physical Medicine Rehab - celecoxib (CELEBREX) 200 MG capsule; Take 1 capsule (200 mg total) by mouth 2 (two) times daily.  Dispense: 60 capsule; Refill: 2   Follow up:  Follow up in 3 months

## 2023-03-14 ENCOUNTER — Encounter: Payer: Self-pay | Admitting: Nurse Practitioner

## 2023-03-26 ENCOUNTER — Other Ambulatory Visit: Payer: Medicaid Other | Admitting: Pharmacist

## 2023-03-26 NOTE — Progress Notes (Signed)
03/26/2023 Name: Jeffery Houston MRN: [id number] DOB: 06-02-1971  Chief Complaint  Patient presents with   Medication Management   Diabetes   Hypertension   Hyperlipidemia    Jeffery Houston is a 52 y.o. year old male who presented for a telephone visit.   They were referred to the pharmacist by their PCP for assistance in managing diabetes, hypertension, and hyperlipidemia.   Patient is participating in a Managed Medicaid Plan:  Yes  Subjective:  Care Team: Primary Care Provider: Ivonne Andrew, NP ; Next Scheduled Visit: 06/21/23  Medication Access/Adherence  Current Pharmacy:  [facility] - [place], [place] - [address] [place] [address] Phone: [phone] Fax: [phone]   Patient reports affordability concerns with their medications: Yes  Patient reports access/transportation concerns to their pharmacy: No  Patient reports adherence concerns with their medications:  No     Diabetes:  Current medications: Ozempic 1 mg daily, metformin 500 mg twice daily   Using DexCom. Reports readings remain controlled in target range.    Hypertension:  Current medications: lisinopril 40 mg daily, amlodipine 10 mg daily   Lisinopril 40 has not been filled in several months, but patient reports he had a supply of 20 mg tablets that he used two of to complete  Hyperlipidemia/ASCVD Risk Reduction  Current lipid lowering medications: rosuvastatin 5 mg daily - reports he has not been able to pick this up yet due to copay. He notes he has worked out [employer] with [employer] previously   Objective:  Lab Results  Component Value Date   HGBA1C 6.1 (H) 12/12/2022    Lab Results  Component Value Date   CREATININE 0.86 12/21/2022   BUN 15 12/21/2022   NA 142 12/21/2022   K 4.5 12/21/2022   CL 105 12/21/2022   CO2 23 12/21/2022    Lab Results  Component Value Date   CHOL 179 12/21/2022   HDL 41 12/21/2022   LDLCALC 116 (H)  12/21/2022   TRIG 122 12/21/2022   CHOLHDL 4.4 12/21/2022    Medications Reviewed Today     Reviewed by Alden Hipp, RPH-CPP (Pharmacist) on 03/26/23 at 1158  Med List Status: <None>   Medication Order Taking? Sig Documenting Provider Last Dose Status Informant  ACCU-CHEK GUIDE test strip [id number]  use test strips 3 times daily Ivonne Andrew, NP  Active   acetaminophen (TYLENOL) 500 MG tablet [id number]  Take 2 tablets (1,000 mg total) by mouth every 6 (six) hours as needed for mild pain or moderate pain. Levester Fresh M, PA-C  Active   amLODipine (NORVASC) 10 MG tablet [id number] Yes Take 1 tablet by mouth daily. Ivonne Andrew, NP Taking Active   aspirin EC 81 MG tablet [id number] Yes Take 1 tablet (81 mg total) by mouth 2 (two) times daily. To prevent blood clots for 30 days after surgery. Levester Fresh M, PA-C Taking Active   blood glucose meter kit and supplies KIT [id number]  Dispense based on patient and insurance preference. Use up to four times daily as directed. Barbette Merino, NP  Active   cefadroxil (DURICEF) 500 MG capsule [id number] No Take 500 mg by mouth 2 (two) times daily.  Patient not taking: Reported on 03/26/2023   [provider] Not Taking Active   celecoxib (CELEBREX) 200 MG capsule [id number]  Take 1 capsule (200 mg total) by mouth 2 (two) times daily. Ivonne Andrew, NP  Active  Continuous Blood Gluc Sensor (DEXCOM G7 SENSOR) MISC [id number] Yes Use to check glucose continously Ivonne Andrew, NP Taking Active   EPINEPHrine 0.3 mg/0.3 mL IJ SOAJ injection [id number]  Inject 0.3 mg into the muscle as needed for anaphylaxis.  Patient not taking: Reported on 03/06/2023   Ivonne Andrew, NP  Active            Med Note Sherilyn Cooter, [facility]   Wed Dec 12, 2022  8:53 AM) Prn   esomeprazole (NEXIUM) 40 MG capsule [id number] Yes Take 1 capsule by mouth daily as needed (acid reflux). Ivonne Andrew, NP Taking Active   ezetimibe (ZETIA) 10 MG tablet [id number]  Yes Take 1 tablet by mouth daily. Ivonne Andrew, NP Taking Active   fluticasone (FLONASE) 50 MCG/ACT nasal spray [id number]  Place 2 sprays into both nostrils daily. Donell Beers, FNP  Active   gabapentin (NEURONTIN) 300 MG capsule [id number] No Take 1 capsule by mouth 3 times daily.  Patient not taking: Reported on 03/26/2023   Ivonne Andrew, NP Not Taking Active            Med Note Clearance Coots, Jaloni Davoli T   Tue Jan 22, 2023 11:41 AM)    lisinopril (ZESTRIL) 40 MG tablet [id number] Yes Take 1 tablet (40 mg total) by mouth every morning. Ivonne Andrew, NP Taking Active   metFORMIN (GLUCOPHAGE) 500 MG tablet [id number] Yes Take 1 tablet (500 mg total) by mouth 2 (two) times daily with a meal. Ivonne Andrew, NP Taking Active   methocarbamol (ROBAXIN-750) 750 MG tablet [id number]  Take 1 tablet (750 mg total) by mouth every 8 (eight) hours as needed for muscle spasms.  Patient not taking: Reported on 03/06/2023   Jenne Pane, PA-C  Active   oxyCODONE (ROXICODONE) 5 MG immediate release tablet [id number]  Take 1 tablet (5 mg total) by mouth every 4 (four) hours as needed for severe pain.  Patient not taking: Reported on 03/06/2023   Levester Fresh M, PA-C  Active   polyethylene glycol powder (MIRALAX) 17 GM/SCOOP powder [id number]  Dissolve 17 g in 4 oz of water or juice and drink by mouth daily. Levester Fresh M, PA-C  Active   rosuvastatin (CRESTOR) 5 MG tablet [id number]  Take 1 tablet (5 mg total) by mouth every other day. Ivonne Andrew, NP  Active   Semaglutide, 1 MG/DOSE, (OZEMPIC, 1 MG/DOSE,) 4 MG/3ML SOPN [id number] Yes Inject 1 mg into the skin once a week. Ivonne Andrew, NP Taking Active   sildenafil (VIAGRA) 25 MG tablet [id number]  Take 1 tablet (25 mg total) by mouth daily as needed for erectile dysfunction. Ivonne Andrew, NP  Active   sodium chloride (OCEAN) 0.65 % nasal spray [id number]  Place 1 spray into the nose as needed for congestion. Donell Beers, FNP  Active    VENTOLIN HFA 108 (90 Base) MCG/ACT inhaler [id number]  Inhale 2 puffs into the lungs every 6 hours as needed for wheezing or shortness of breath. Kathrynn Speed, NP  Active   Med List Note Barbette Merino, [place] 10/17/20 1436):                Assessment/Plan:   Diabetes: - Currently controlled - Recommend to continue current regimen at this time  Hypertension: - Currently controlled - Recommend to continue current regimen at this time   Hyperlipidemia/ASCVD Risk Reduction: - Currently uncontrolled. Patient will pick up rosuvastatin from the pharmacy when able.  Discussed with patient that is [facility] is ever unable to work with him regarding payment plans for his copays, we can connect him with [facility]  Follow Up Plan: phone call in 6 weeks  Catie TClearance Coots, PharmD, BCACP, CPP [facility] [phone]

## 2023-04-01 ENCOUNTER — Encounter: Payer: Self-pay | Admitting: Physical Medicine & Rehabilitation

## 2023-04-01 ENCOUNTER — Encounter: Payer: Medicaid Other | Attending: Physical Medicine & Rehabilitation | Admitting: Physical Medicine & Rehabilitation

## 2023-04-01 VITALS — BP 133/83 | HR 67 | Ht 76.5 in | Wt 254.0 lb

## 2023-04-01 DIAGNOSIS — M2550 Pain in unspecified joint: Secondary | ICD-10-CM | POA: Diagnosis not present

## 2023-04-01 DIAGNOSIS — M170 Bilateral primary osteoarthritis of knee: Secondary | ICD-10-CM

## 2023-04-01 DIAGNOSIS — M25511 Pain in right shoulder: Secondary | ICD-10-CM

## 2023-04-01 DIAGNOSIS — M5441 Lumbago with sciatica, right side: Secondary | ICD-10-CM | POA: Diagnosis present

## 2023-04-01 DIAGNOSIS — I10 Essential (primary) hypertension: Secondary | ICD-10-CM | POA: Diagnosis not present

## 2023-04-01 DIAGNOSIS — M5442 Lumbago with sciatica, left side: Secondary | ICD-10-CM | POA: Diagnosis not present

## 2023-04-01 DIAGNOSIS — G8929 Other chronic pain: Secondary | ICD-10-CM | POA: Diagnosis present

## 2023-04-01 MED ORDER — DULOXETINE HCL 30 MG PO CPEP: 30.0000 mg | ORAL_CAPSULE | Freq: Every day | ORAL | 3 refills | Status: AC

## 2023-04-01 MED ORDER — DULOXETINE HCL 30 MG PO CPEP: 30.0000 mg | ORAL_CAPSULE | Freq: Every day | ORAL | 3 refills | Status: DC

## 2023-04-01 NOTE — Progress Notes (Unsigned)
Subjective:    Patient ID: Jeffery Houston, male    DOB: 10-21-1971, 52 y.o.   MRN: [id number]  HPI    Jeffery Houston is a 52 y.o. year old male  who  has a past medical history of Arthritis, Diabetes mellitus without complication (HCC), Exertional chest pain (01/23/2019), Exertional dyspnea (01/23/2019), and Hypertension.   They are presenting to PM&R clinic as a new patient for pain management evaluation.  He was referred for polyarthralgia. Pt has been having increased pain for about 6 years. It started with pain in his knees.  He reports multiple surgeries on his knees.  He then had a partial knee replacement on both knees and then eventually had a TKA on the left.  He also developed lower back pain.  Back pain will shoot down his legs to his feet bilaterally.  Pain is worsened with activities.  Another significant source of pain for him is his right shoulder.  He had cortisone injections for his R shoulder with short term benefit.  He has been followed by [facility].  More recently he has developed some milder pain in his neck.  Pain is not well-controlled with NSAIDs and Tylenol.   Red flag symptoms: No red flags for back pain endorsed in Hx or ROS  Medications tried: Topical medications - voltaren- helps slightly Nsaids - celebrex- not helping very much Tylenol - helped a little  Opiates  Oxycodone- after knee surgery, has been using with short term benefit Tramadol- helped a little after surgery  Gabapentin / Lyrica - gabapentin for many years doesn't help much  TCAs  - denies  SNRIs  - denies  Other  denies   Other treatments: PT/OT  - Pt for knee- helped for function TENs unit - tried something that was" moving my muscles", reports didn't Injections - Knee and shoulder injections helped for short time Surgery He had knee arthroscopy multiple, partial knee replacements b/l then TKA on left   Prior UDS results: No results found for: "LABOPIA", "COCAINSCRNUR",  "LABBENZ", "AMPHETMU", "THCU", "LABBARB"     Pain Inventory Average Pain 7 Pain Right Now 5 My pain is intermittent, burning, and aching  In the last 24 hours, has pain interfered with the following? General activity 4 Relation with others 2 Enjoyment of life 2 What TIME of day is your pain at its worst? morning , daytime, evening, night, and varies Sleep (in general) Fair  Pain is worse with: some activites Pain improves with:  na Relief from Meds:  na  walk without assistance ability to climb steps?  yes do you drive?  yes Do you have any goals in this area?  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  Vaping Use  Vaping Use: Never used  Substance and Sexual Activity   Alcohol use: Yes    Comment: social   Drug use: Yes    Types: Marijuana   Sexual activity: Yes    Birth control/protection: None  Other Topics Concern   Not on file  Social History Narrative   Not on file   Social Determinants of Health   Financial Resource Strain: Medium Risk (03/26/2023)   Overall Financial Resource Strain (CARDIA)    Difficulty of Paying Living Expenses: Somewhat hard  Food Insecurity: No Food Insecurity (12/25/2022)   Hunger Vital Sign    Worried About Running Out of Food in the Last Year: Never true    Ran Out of Food in the Last Year: Never true  Transportation Needs: No Transportation Needs  (12/25/2022)   PRAPARE - Administrator, Civil Service (Medical): No    Lack of Transportation (Non-Medical): No  Physical Activity: Not on file  Stress: Not on file  Social Connections: Not on file   Past Surgical History:  Procedure Laterality Date   FOOT SURGERY     PARTIAL KNEE ARTHROPLASTY Left 07/22/2015   Procedure: LEF PARTIAL KNEE REPLACEMENT ;  Surgeon: Sheral Apley, MD;  Location: [facility];  Service: Orthopedics;  Laterality: Left;   PARTIAL KNEE ARTHROPLASTY Right 10/05/2016   Procedure: UNICOMPARTMENTAL KNEE;  Surgeon: Sheral Apley, MD;  Location: [facility];  Service: Orthopedics;  Laterality: Right;  Pre/Post Op femoral nerve block   TOTAL KNEE REVISION Left 12/25/2022   Procedure: TOTAL KNEE REVISION;  Surgeon: Sheral Apley, MD;  Location: [facility];  Service: Orthopedics;  Laterality: Left;   WRIST SURGERY Right    tendon repair   Past Medical History:  Diagnosis Date   Arthritis    bil knees   Diabetes mellitus without complication (HCC)    Exertional chest pain 01/23/2019   Exertional dyspnea 01/23/2019   Hypertension    BP 133/83   Pulse 67   Ht 6' 4.5" (1.943 m)   Wt 254 lb (115.2 kg)   SpO2 98%   BMI 30.52 kg/m   Opioid Risk Score:   Fall Risk Score:  `1  Depression screen [facility] 2/9     04/01/2023    9:29 AM 03/06/2023   11:04 AM 12/12/2022    9:00 AM 09/19/2022   11:05 AM 07/25/2022    9:16 AM 06/18/2022    1:20 PM 12/25/2021    8:54 AM  Depression screen PHQ 2/9  Decreased Interest 0 0 0 0 0 0 0  Down, Depressed, Hopeless 0 0 0 0 0 0 0  PHQ - 2 Score 0 0 0 0 0 0 0  Altered sleeping 2   0 0 0   Tired, decreased energy 2   0 0 2   Change in appetite 0   0 0 0   Feeling bad or failure about yourself  0   0 0 0   Trouble concentrating 0   0 0 0   Moving slowly or fidgety/restless 0   0 0 0   Suicidal thoughts 0   0 0 0   PHQ-9 Score 4   0 0 2       Review of Systems  Musculoskeletal:  Positive for  back pain.       B/L leg shoulderswrist pain  All other systems reviewed and are negative.     Objective:   Physical Exam  Gen: no distress, normal appearing HEENT: oral mucosa pink and moist, NCAT Cardio: Reg rate Chest: normal effort, normal rate of breathing Abd: soft, non-distended Ext: no edema Psych: pleasant, normal affect Skin: intact Neuro: Alert and awake, follows commands, cranial nerves II through XII grossly intact, normal speech and language Strength 5 out of 5 in all bilateral upper extremities and right lower extremity No focal motor deficits noted in left lower extremity however appears to be pain limited at hip flexion and knee extension No ankle clonus No ataxia or dysmetria Sensation intact light touch in all 4 extremities Musculoskeletal:  SLR negative Lumbar paraspinal tenderness, mild cervical paraspinal tenderness Minimal bilateral knee joint line tenderness Mild pain noted with varus and valgus stress Right shoulder with mild tenderness anterior and posterior Shoulder pain on the right with internal rotation, external rotation and abduction.  Crossarm test positive, Neer's test positive Tenderness with compression of his bilaterally Facet loading negative No significant ankle tenderness Mild left hip pain with internal rotation FABER and FADIR resulted primarily in diffuse low back pain   L knee xray 12/25/22 Total knee arthroplasty without complicating feature.   Bone scan 07/05/22 Mildly increased blood flow and blood pool of tracer at both knees.   Post BILATERAL medial compartment hemiarthroplasties of the knees bilaterally with BILATERAL abnormal tracer uptake at the medial tibial plateaus adjacent to the prosthesis, much greater on LEFT.   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   Uptake at the RIGHT knee is nonspecific in the absence of symptoms; if patient is symptomatic at the RIGHT knee than aseptic  loosening should be considered.  TBS/fibula right 07/13/2021 IMPRESSION: 1. Soft tissue edema of the lower extremity. No radiographic findings of osteomyelitis. No soft tissue air or radiopaque foreign body. 2. Medial compartment hemiarthroplasty of the knee. Heterotopic ossification adjacent to the distal medial femur is more defined than on 2018 knee radiographs.   Right shoulder MRI 03/22/2018 IMPRESSION: 1. Supraspinatus and infraspinatus tendinosis with associated small foci of intrasubstance insertional tearing. No full-thickness tendon tear. 2. The subscapularis, teres minor and biceps tendons appear normal. 3. Mild glenohumeral degenerative changes without evidence of labral tear. 4. Moderate acromioclavicular degenerative changes with probable subacromial ganglion.       Assessment & Plan:   Polyarthralgia -Patient has pain in bilateral wrists, knees, right shoulder, lower back -Patient appears to have multiple areas of arthritis and pain for his body, particularly for his relatively young age -CCP, ESR, ANA, RA were noted to be negative -His presentation of symmetric joint pain concerning for rheumatological disorder, will refer to rheumatology -Will start duloxetine -Discussed foods for pain, advised trying turmeric  Bilateral knee osteoarthritis -Status post bilateral knee hemiarthroplasty and later left TKA -Will order duloxetine 30 mg, discussed trying for 2 weeks if tolerating increase to 60 mg -Asked patient to provide records from [facility]  Right shoulder pain -Suspect he has some rotator cuff dysfunction in addition to 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  Asked him to have records sent from [facility] so that we can review this -Medications as above

## 2023-04-17 ENCOUNTER — Ambulatory Visit: Payer: Medicaid Other | Admitting: Podiatry

## 2023-04-26 ENCOUNTER — Encounter: Payer: Self-pay | Admitting: Pharmacist

## 2023-04-29 ENCOUNTER — Ambulatory Visit: Payer: Medicaid Other | Admitting: Podiatry

## 2023-05-07 ENCOUNTER — Other Ambulatory Visit: Payer: Medicaid Other | Admitting: Pharmacist

## 2023-05-13 ENCOUNTER — Encounter: Payer: Medicaid Other | Admitting: Physical Medicine & Rehabilitation

## 2023-05-16 ENCOUNTER — Encounter: Payer: Medicaid Other | Admitting: Pharmacist

## 2023-05-16 ENCOUNTER — Other Ambulatory Visit (INDEPENDENT_AMBULATORY_CARE_PROVIDER_SITE_OTHER): Payer: Medicaid Other | Admitting: Pharmacist

## 2023-05-16 DIAGNOSIS — I10 Essential (primary) hypertension: Secondary | ICD-10-CM

## 2023-05-16 DIAGNOSIS — E1169 Type 2 diabetes mellitus with other specified complication: Secondary | ICD-10-CM

## 2023-05-16 NOTE — Patient Instructions (Signed)
Simone,   It was great talking to you today!  Stop rosuvastatin. Start Repatha 140 mg every 2 weeks.   Catie Eppie Gibson, PharmD, BCACP, CPP Clinical Pharmacist [facility] [phone]

## 2023-05-16 NOTE — Progress Notes (Deleted)
05/16/2023 Name: Jeffery Houston MRN: [id number] DOB: [birth year]  Chief Complaint  Patient presents with   Medication Management   Diabetes   Hypertension   Hyperlipidemia    Jeffery Houston is a 52 y.o. year old male who presented for a telephone visit.   They were referred to the pharmacist by their PCP for assistance in managing diabetes, hypertension, and hyperlipidemia.    Subjective:  Care Team: Primary Care Provider: Ivonne Andrew, NP ; Next Scheduled Visit:   Medication Access/Adherence  Current Pharmacy:  [facility] [place], [place] - [address] [place] [address] Phone: [phone] Fax: [phone]   Patient reports affordability concerns with their medications: No  Patient reports access/transportation concerns to their pharmacy: No  Patient reports adherence concerns with their medications:  No     Diabetes:  Current medications: Ozempic 1 mg weekly, metformin 500 mg twice daily   Hypertension:  Current medications: amlodipine 10 mg daily, lisinopril 40 mg daily    04/01/2023    9:24 AM 03/06/2023   11:05 AM 12/26/2022   12:19 PM  Vitals with BMI  Height 6' 4.5" 6' 4.5"   Weight 254 lbs 246 lbs 10 oz   BMI 30.52 29.63   Systolic 133 121 952  Diastolic 83 80 67  Pulse 67 85 81    Hyperlipidemia/ASCVD Risk Reduction  Current lipid lowering medications: rosuvastatin 5 mg daily - notes he had recurrence in muscle pains when starting 5 mg tablet. Resolved with discontinuation; ezetimibe 10 mg daily Medications tried in the past: lovastatin, pravastatin, pitavastatin - muscle pains with both   Antiplatelet regimen: aspirin 81 mg daily  Baseline LDL prior to any therapy: 841 (12/25/21) Most recent LDL on ezetimbe 116 (12/21/22)  Goal LDL <70 given diabetes and risk factors of hypertension, family history of ASCVD  Objective:  Lab Results  Component Value Date   HGBA1C 6.1 (H) 12/12/2022    Lab Results   Component Value Date   CREATININE 0.86 12/21/2022   BUN 15 12/21/2022   NA 142 12/21/2022   K 4.5 12/21/2022   CL 105 12/21/2022   CO2 23 12/21/2022    Lab Results  Component Value Date   CHOL 179 12/21/2022   HDL 41 12/21/2022   LDLCALC 116 (H) 12/21/2022   TRIG 122 12/21/2022   CHOLHDL 4.4 12/21/2022    Medications Reviewed Today     Reviewed by Alden Hipp, RPH-CPP (Pharmacist) on 05/16/23 at 1326  Med List Status: <None>   Medication Order Taking? Sig Documenting Provider Last Dose Status Informant  ACCU-CHEK GUIDE test strip [id number]  use test strips 3 times daily Ivonne Andrew, NP  Active   acetaminophen (TYLENOL) 500 MG tablet [id number]  Take 2 tablets (1,000 mg total) by mouth every 6 (six) hours as needed for mild pain or moderate pain. Levester Fresh M, PA-C  Active   amLODipine (NORVASC) 10 MG tablet [id number] Yes Take 1 tablet by mouth daily. Ivonne Andrew, NP Taking Active   aspirin EC 81 MG tablet [id number]  Take 1 tablet (81 mg total) by mouth 2 (two) times daily. To prevent blood clots for 30 days after surgery. Levester Fresh M, PA-C  Active   blood glucose meter kit and supplies KIT [id number]  Dispense based on patient and insurance preference. Use up to four times daily as directed. Barbette Merino, NP  Active   celecoxib (CELEBREX) 200 MG capsule [id number]  Take 1 capsule (200 mg total) by mouth 2 (two) times daily. Ivonne Andrew, NP  Active   Continuous Blood Gluc Sensor (DEXCOM G7 SENSOR) MISC [id number] Yes Use to check glucose continously Ivonne Andrew, NP Taking Active   DULoxetine (CYMBALTA) 30 MG capsule [id number]  Take 1-2 capsules (30-60 mg total) by mouth daily. Fanny Dance, MD  Active   EPINEPHrine 0.3 mg/0.3 mL IJ SOAJ injection [id number]  Inject 0.3 mg into the muscle as needed for anaphylaxis. Ivonne Andrew, NP  Active            Med Note Renelda Loma   Wed Dec 12, 2022  8:53 AM) Prn   esomeprazole (NEXIUM) 40 MG  capsule [id number]  Take 1 capsule by mouth daily as needed (acid reflux). Ivonne Andrew, NP  Active   ezetimibe (ZETIA) 10 MG tablet [id number] Yes Take 1 tablet by mouth daily. Ivonne Andrew, NP Taking Active   fluticasone (FLONASE) 50 MCG/ACT nasal spray [id number]  Place 2 sprays into both nostrils daily. Donell Beers, FNP  Active   gabapentin (NEURONTIN) 300 MG capsule [id number]  Take 1 capsule by mouth 3 times daily. Ivonne Andrew, NP  Active            Med Note Clearance Coots, Finn Amos T   Tue Jan 22, 2023 11:41 AM)    lisinopril (ZESTRIL) 40 MG tablet [id number] Yes Take 1 tablet (40 mg total) by mouth every morning. Ivonne Andrew, NP Taking Active   metFORMIN (GLUCOPHAGE) 500 MG tablet [id number] Yes Take 1 tablet (500 mg total) by mouth 2 (two) times daily with a meal. Ivonne Andrew, NP Taking Active   methocarbamol (ROBAXIN-750) 750 MG tablet [id number]  Take 1 tablet (750 mg total) by mouth every 8 (eight) hours as needed for muscle spasms. Levester Fresh M, PA-C  Active   oxyCODONE (ROXICODONE) 5 MG immediate release tablet [id number]  Take 1 tablet (5 mg total) by mouth every 4 (four) hours as needed for severe pain. Levester Fresh M, PA-C  Active   polyethylene glycol powder (MIRALAX) 17 GM/SCOOP powder [id number]  Dissolve 17 g in 4 oz of water or juice and drink by mouth daily. Levester Fresh M, PA-C  Active   rosuvastatin (CRESTOR) 5 MG tablet [id number] No Take 1 tablet (5 mg total) by mouth every other day.  Patient not taking: Reported on 05/16/2023   Ivonne Andrew, NP Not Taking Active   Semaglutide, 1 MG/DOSE, (OZEMPIC, 1 MG/DOSE,) 4 MG/3ML SOPN [id number] Yes Inject 1 mg into the skin once a week. Ivonne Andrew, NP Taking Active   sildenafil (VIAGRA) 25 MG tablet [id number]  Take 1 tablet (25 mg total) by mouth daily as needed for erectile dysfunction. Ivonne Andrew, NP  Active   sodium chloride (OCEAN) 0.65 % nasal spray [id number]  Place 1 spray into the nose as  needed for congestion. Donell Beers, FNP  Active   VENTOLIN HFA 108 (90 Base) MCG/ACT inhaler [id number]  Inhale 2 puffs into the lungs every 6 hours as needed for wheezing or shortness of breath. Kathrynn Speed, NP  Active   Med List Note Barbette Merino, [place] 10/17/20 1436):                Assessment/Plan:   Diabetes: - Currently controlled - Recommend to continue current regimen at this time  Hypertension: - Currently controlled - Recommend to continue current regimen at this time  Hyperlipidemia/ASCVD Risk Reduction: - Currently uncontrolled.  - Reviewed long term complications of uncontrolled cholesterol - Intolerance to several hydrophilic statins, including rosuvastatin, pravastatin, pitavastatin at lowest dose. With pre-treatment LDL >190, PCSK9i therapy is recommended as therapy to achieve goal LDL <70. Will complete prior authorization  Follow Up Plan: phone call in 6 weeks  Catie TClearance Coots, PharmD, BCACP, CPP Clinical Pharmacist [facility] [phone]

## 2023-05-16 NOTE — Progress Notes (Signed)
05/16/2023 Name: Jeffery Houston MRN: [id number] DOB: 03/15/1971  Chief Complaint  Patient presents with   Medication Management   Diabetes   Hypertension   Hyperlipidemia    ALGIS LEHENBAUER is a 52 y.o. year old male who presented for a telephone visit.   They were referred to the pharmacist by their PCP for assistance in managing diabetes, hypertension, and hyperlipidemia.    Subjective:  Care Team: Primary Care Provider: Ivonne Andrew, NP ; Next Scheduled Visit:   Medication Access/Adherence  Current Pharmacy:  [facility] [place], [place] - [address] [place] [address] Phone: [phone] Fax: [phone]   Patient reports affordability concerns with their medications: No  Patient reports access/transportation concerns to their pharmacy: No  Patient reports adherence concerns with their medications:  No     Diabetes:  Current medications: Ozempic 1 mg weekly, metformin 500 mg twice daily   Hypertension:  Current medications: amlodipine 10 mg daily, lisinopril 40 mg daily    04/01/2023    9:24 AM 03/06/2023   11:05 AM 12/26/2022   12:19 PM  Vitals with BMI  Height 6' 4.5" 6' 4.5"   Weight 254 lbs 246 lbs 10 oz   BMI 30.52 29.63   Systolic 133 121 696  Diastolic 83 80 67  Pulse 67 85 81    Hyperlipidemia/ASCVD Risk Reduction  Current lipid lowering medications: rosuvastatin 5 mg daily - notes he had recurrence in muscle pains when starting 5 mg tablet. Resolved with discontinuation; ezetimibe 10 mg daily Medications tried in the past: lovastatin, pravastatin, pitavastatin - muscle pains with both   Antiplatelet regimen: aspirin 81 mg daily  Baseline LDL prior to any therapy: 295 (12/25/21) Most recent LDL on ezetimbe 116 (12/21/22)  Goal LDL <70 given diabetes and risk factors of hypertension, family history of ASCVD  Objective:  Lab Results  Component Value Date   HGBA1C 6.1 (H) 12/12/2022    Lab Results   Component Value Date   CREATININE 0.86 12/21/2022   BUN 15 12/21/2022   NA 142 12/21/2022   K 4.5 12/21/2022   CL 105 12/21/2022   CO2 23 12/21/2022    Lab Results  Component Value Date   CHOL 179 12/21/2022   HDL 41 12/21/2022   LDLCALC 116 (H) 12/21/2022   TRIG 122 12/21/2022   CHOLHDL 4.4 12/21/2022    Medications Reviewed Today     Reviewed by Alden Hipp, RPH-CPP (Pharmacist) on 05/16/23 at 1326  Med List Status: <None>   Medication Order Taking? Sig Documenting Provider Last Dose Status Informant  ACCU-CHEK GUIDE test strip [id number]  use test strips 3 times daily Ivonne Andrew, NP  Active   acetaminophen (TYLENOL) 500 MG tablet [id number]  Take 2 tablets (1,000 mg total) by mouth every 6 (six) hours as needed for mild pain or moderate pain. Levester Fresh M, PA-C  Active   amLODipine (NORVASC) 10 MG tablet [id number] Yes Take 1 tablet by mouth daily. Ivonne Andrew, NP Taking Active   aspirin EC 81 MG tablet [id number]  Take 1 tablet (81 mg total) by mouth 2 (two) times daily. To prevent blood clots for 30 days after surgery. Levester Fresh M, PA-C  Active   blood glucose meter kit and supplies KIT [id number]  Dispense based on patient and insurance preference. Use up to four times daily as directed. Barbette Merino, NP  Active   celecoxib (CELEBREX) 200 MG capsule [id number]  Take 1 capsule (200 mg total) by mouth 2 (two) times daily. Ivonne Andrew, NP  Active   Continuous Blood Gluc Sensor (DEXCOM G7 SENSOR) MISC [id number] Yes Use to check glucose continously Ivonne Andrew, NP Taking Active   DULoxetine (CYMBALTA) 30 MG capsule [id number]  Take 1-2 capsules (30-60 mg total) by mouth daily. Fanny Dance, MD  Active   EPINEPHrine 0.3 mg/0.3 mL IJ SOAJ injection [id number]  Inject 0.3 mg into the muscle as needed for anaphylaxis. Ivonne Andrew, NP  Active            Med Note Renelda Loma   Wed Dec 12, 2022  8:53 AM) Prn   esomeprazole (NEXIUM) 40 MG  capsule [id number]  Take 1 capsule by mouth daily as needed (acid reflux). Ivonne Andrew, NP  Active   ezetimibe (ZETIA) 10 MG tablet [id number] Yes Take 1 tablet by mouth daily. Ivonne Andrew, NP Taking Active   fluticasone (FLONASE) 50 MCG/ACT nasal spray [id number]  Place 2 sprays into both nostrils daily. Donell Beers, FNP  Active   gabapentin (NEURONTIN) 300 MG capsule [id number]  Take 1 capsule by mouth 3 times daily. Ivonne Andrew, NP  Active            Med Note Clearance Coots, Jazsmin Couse T   Tue Jan 22, 2023 11:41 AM)    lisinopril (ZESTRIL) 40 MG tablet [id number] Yes Take 1 tablet (40 mg total) by mouth every morning. Ivonne Andrew, NP Taking Active   metFORMIN (GLUCOPHAGE) 500 MG tablet [id number] Yes Take 1 tablet (500 mg total) by mouth 2 (two) times daily with a meal. Ivonne Andrew, NP Taking Active   methocarbamol (ROBAXIN-750) 750 MG tablet [id number]  Take 1 tablet (750 mg total) by mouth every 8 (eight) hours as needed for muscle spasms. Levester Fresh M, PA-C  Active   oxyCODONE (ROXICODONE) 5 MG immediate release tablet [id number]  Take 1 tablet (5 mg total) by mouth every 4 (four) hours as needed for severe pain. Levester Fresh M, PA-C  Active   polyethylene glycol powder (MIRALAX) 17 GM/SCOOP powder [id number]  Dissolve 17 g in 4 oz of water or juice and drink by mouth daily. Levester Fresh M, PA-C  Active   rosuvastatin (CRESTOR) 5 MG tablet [id number] No Take 1 tablet (5 mg total) by mouth every other day.  Patient not taking: Reported on 05/16/2023   Ivonne Andrew, NP Not Taking Active   Semaglutide, 1 MG/DOSE, (OZEMPIC, 1 MG/DOSE,) 4 MG/3ML SOPN [id number] Yes Inject 1 mg into the skin once a week. Ivonne Andrew, NP Taking Active   sildenafil (VIAGRA) 25 MG tablet [id number]  Take 1 tablet (25 mg total) by mouth daily as needed for erectile dysfunction. Ivonne Andrew, NP  Active   sodium chloride (OCEAN) 0.65 % nasal spray [id number]  Place 1 spray into the nose as  needed for congestion. Donell Beers, FNP  Active   VENTOLIN HFA 108 (90 Base) MCG/ACT inhaler [id number]  Inhale 2 puffs into the lungs every 6 hours as needed for wheezing or shortness of breath. Kathrynn Speed, NP  Active   Med List Note Barbette Merino, [place] 10/17/20 1436):                Assessment/Plan:   Diabetes: - Currently controlled - Recommend to continue current regimen at this time  Hypertension: - Currently controlled - Recommend to continue current regimen at this time  Hyperlipidemia/ASCVD Risk Reduction: - Currently uncontrolled.  - Reviewed long term complications of uncontrolled cholesterol - Intolerance to several hydrophilic statins, including rosuvastatin, pravastatin, pitavastatin at lowest dose. With pre-treatment LDL >190, PCSK9i therapy is recommended as therapy to achieve goal LDL <70. Will complete prior authorization Addendum: PA approved. Discussed with provider, they are in agreement. Order placed.   Follow Up Plan: phone call in 6 weeks  Catie TClearance Coots, PharmD, BCACP, CPP Clinical Pharmacist [facility]

## 2023-05-20 MED ORDER — REPATHA SURECLICK 140 MG/ML ~~LOC~~ SOAJ: 140.0000 mg | SUBCUTANEOUS | 2 refills | Status: AC

## 2023-05-30 NOTE — Addendum Note (Signed)
Addended by: Nilda Simmer T on: 05/30/2023 08:11 AM   Modules accepted: Level of Service

## 2023-06-18 ENCOUNTER — Other Ambulatory Visit: Payer: Self-pay | Admitting: Nurse Practitioner

## 2023-06-18 DIAGNOSIS — G8929 Other chronic pain: Secondary | ICD-10-CM

## 2023-06-21 ENCOUNTER — Ambulatory Visit: Payer: Self-pay | Admitting: Nurse Practitioner

## 2023-07-24 ENCOUNTER — Ambulatory Visit
Admission: EM | Admit: 2023-07-24 | Discharge: 2023-07-24 | Disposition: A | Discharge status: Home / Self Care | Payer: Medicaid Other | Attending: Physician Assistant | Admitting: Physician Assistant

## 2023-07-24 DIAGNOSIS — J069 Acute upper respiratory infection, unspecified: Secondary | ICD-10-CM | POA: Diagnosis present

## 2023-07-24 DIAGNOSIS — U071 COVID-19: Secondary | ICD-10-CM | POA: Insufficient documentation

## 2023-07-24 NOTE — ED Triage Notes (Signed)
"  Started with Congestion and night time a real hacking Cough". Symptoms began "Last Thursday evening". No fever. "I want COVID19 testing".

## 2023-07-24 NOTE — ED Provider Notes (Signed)
[facility]    CSN: [id number] Arrival date & time: 07/24/23  1359      History   Chief Complaint Chief Complaint  Patient presents with   Cough   COVID19 Testing    Request (Family of 2)    HPI Jeffery Houston is a 52 y.o. male.   Patient here today for evaluation of nasal congestion and drainage with cough.  He states that cough is a hacking cough and has not been productive.  He notes symptoms have been going on for 6 days.  He has not had any fever.  He would like COVID screening as he has had exposure to same.  He denies any vomiting or diarrhea.  He does not report treatment for symptoms.  The history is provided by the patient.  Cough Associated symptoms: no chills, no ear pain, no eye discharge, no fever, no shortness of breath and no sore throat     Past Medical History:  Diagnosis Date   Arthritis    bil knees   Diabetes mellitus without complication (HCC)    Exertional chest pain 01/23/2019   Exertional dyspnea 01/23/2019   Hypertension     Patient Active Problem List   Diagnosis Date Noted   Chronic pain of multiple joints 03/06/2023   S/P total knee arthroplasty, left 12/25/2022   Lipid screening 12/21/2022   Diabetic neuropathy (HCC) 12/17/2022   Pain due to onychomycosis of toenails of both feet 12/17/2022   Statin intolerance 12/12/2022   Adverse effect of statin 12/12/2022   Acute recurrent sinusitis 12/12/2022   Hyperlipidemia 07/25/2022   Uncontrolled type 2 diabetes mellitus with hyperglycemia (HCC) 06/18/2022   Pneumonia due to COVID-19 virus 09/01/2020   Closed fracture of nasal bones 01/26/2019   Shortness of breath 01/23/2019   Laboratory examination 01/23/2019   Exertional chest pain 01/23/2019   Primary osteoarthritis of right knee 09/24/2016   Essential hypertension 09/24/2016   S/P left unicompartmental knee replacement 07/22/2015    Past Surgical History:  Procedure Laterality Date   FOOT SURGERY     PARTIAL KNEE  ARTHROPLASTY Left 07/22/2015   Procedure: LEF PARTIAL KNEE REPLACEMENT ;  Surgeon: Sheral Apley, MD;  Location: [facility];  Service: Orthopedics;  Laterality: Left;   PARTIAL KNEE ARTHROPLASTY Right 10/05/2016   Procedure: UNICOMPARTMENTAL KNEE;  Surgeon: Sheral Apley, MD;  Location: [facility];  Service: Orthopedics;  Laterality: Right;  Pre/Post Op femoral nerve block   TOTAL KNEE REVISION Left 12/25/2022   Procedure: TOTAL KNEE REVISION;  Surgeon: Sheral Apley, MD;  Location: [facility];  Service: Orthopedics;  Laterality: Left;   WRIST SURGERY Right    tendon repair       Home Medications    Prior to Admission medications   Medication Sig Start Date End Date Taking? Authorizing Provider  metFORMIN (GLUCOPHAGE) 500 MG tablet Take 1 tablet (500 mg total) by mouth 2 (two) times daily with a meal. 02/26/23  Yes Ivonne Andrew, NP  ACCU-CHEK GUIDE test strip use test strips 3 times daily 01/10/23   Ivonne Andrew, NP  acetaminophen (TYLENOL) 500 MG tablet Take 2 tablets (1,000 mg total) by mouth every 6 (six) hours as needed for mild pain or moderate pain. 12/26/22   Jenne Pane, PA-C  amLODipine (NORVASC) 10 MG tablet Take 1 tablet by mouth daily. 12/21/22   Ivonne Andrew, NP  aspirin EC 81 MG tablet Take 1 tablet (81 mg  total) by mouth 2 (two) times daily. To prevent blood clots for 30 days after surgery. 12/26/22   Jenne Pane, PA-C  blood glucose meter kit and supplies KIT Dispense based on patient and insurance preference. Use up to four times daily as directed. 12/25/21   Barbette Merino, NP  celecoxib (CELEBREX) 200 MG capsule TAKE 1 CAPSULE BY MOUTH 2 TIMES DAILY 06/18/23   Ivonne Andrew, NP  Continuous Blood Gluc Sensor (DEXCOM G7 SENSOR) MISC Use to check glucose continously 07/18/22   Ivonne Andrew, NP  DULoxetine (CYMBALTA) 30 MG capsule Take 1-2 capsules (30-60 mg total) by mouth daily. 04/01/23   Fanny Dance, MD  EPINEPHrine 0.3  mg/0.3 mL IJ SOAJ injection Inject 0.3 mg into the muscle as needed for anaphylaxis. 07/25/22   Ivonne Andrew, NP  esomeprazole (NEXIUM) 40 MG capsule Take 1 capsule by mouth daily as needed (acid reflux). 02/01/23   Ivonne Andrew, NP  Evolocumab (REPATHA SURECLICK) 140 MG/ML SOAJ Inject 140 mg into the skin every 14 (fourteen) days. 05/20/23   Ivonne Andrew, NP  ezetimibe (ZETIA) 10 MG tablet Take 1 tablet by mouth daily. 12/21/22   Ivonne Andrew, NP  fluticasone (FLONASE) 50 MCG/ACT nasal spray Place 2 sprays into both nostrils daily. 12/12/22   Donell Beers, FNP  gabapentin (NEURONTIN) 300 MG capsule Take 1 capsule by mouth 3 times daily. 12/21/22   Ivonne Andrew, NP  lisinopril (ZESTRIL) 40 MG tablet Take 1 tablet (40 mg total) by mouth every morning. 02/26/23   Ivonne Andrew, NP  methocarbamol (ROBAXIN-750) 750 MG tablet Take 1 tablet (750 mg total) by mouth every 8 (eight) hours as needed for muscle spasms. 12/26/22   Jenne Pane, PA-C  oxyCODONE (ROXICODONE) 5 MG immediate release tablet Take 1 tablet (5 mg total) by mouth every 4 (four) hours as needed for severe pain. 12/26/22   Jenne Pane, PA-C  polyethylene glycol powder (MIRALAX) 17 GM/SCOOP powder Dissolve 17 g in 4 oz of water or juice and drink by mouth daily. 12/26/22   Jenne Pane, PA-C  Semaglutide, 1 MG/DOSE, (OZEMPIC, 1 MG/DOSE,) 4 MG/3ML SOPN Inject 1 mg into the skin once a week. 02/26/23   Ivonne Andrew, NP  sildenafil (VIAGRA) 25 MG tablet Take 1 tablet (25 mg total) by mouth daily as needed for erectile dysfunction. 09/19/22   Ivonne Andrew, NP  sodium chloride (OCEAN) 0.65 % nasal spray Place 1 spray into the nose as needed for congestion. 12/12/22   Paseda, Baird Kay, FNP  VENTOLIN HFA 108 (90 Base) MCG/ACT inhaler Inhale 2 puffs into the lungs every 6 hours as needed for wheezing or shortness of breath. 03/23/22   Orion Crook I, NP    Family History Family History  Problem Relation Age of Onset    Heart attack Brother     Social History Social History   Tobacco Use   Smoking status: Some Days    Types: Cigars    Last attempt to quit: 12/04/2022    Years since quitting: 0.6   Smokeless tobacco: Former   Tobacco comments:    Quit smoking cigarettes in 2011, now smokes cigars socially.  Vaping Use   Vaping status: Never Used  Substance Use Topics   Alcohol use: Yes    Comment: social   Drug use: Yes    Types: Marijuana     Allergies   Bee venom, Crestor [rosuvastatin calcium], and Latex  Review of Systems Review of Systems  Constitutional:  Negative for chills and fever.  HENT:  Positive for congestion. Negative for ear pain and sore throat.   Eyes:  Negative for discharge and redness.  Respiratory:  Positive for cough. Negative for shortness of breath.   Gastrointestinal:  Negative for abdominal pain, nausea and vomiting.     Physical Exam Triage Vital Signs ED Triage Vitals  Encounter Vitals Group     BP 07/24/23 1420 130/76     Systolic BP Percentile --      Diastolic BP Percentile --      Pulse Rate 07/24/23 1420 81     Resp 07/24/23 1420 18     Temp 07/24/23 1420 98 F (36.7 C)     Temp Source 07/24/23 1420 Oral     SpO2 07/24/23 1420 96 %     Weight 07/24/23 1417 253 lb 15.5 oz (115.2 kg)     Height 07/24/23 1417 6' 4.5" (1.943 m)     Head Circumference --      Peak Flow --      Pain Score 07/24/23 1405 0     Pain Loc --      Pain Education --      Exclude from Growth Chart --    No data found.  Updated Vital Signs BP 130/76 (BP Location: Left Arm)   Pulse 81   Temp 98 F (36.7 C) (Oral)   Resp 18   Ht 6' 4.5" (1.943 m)   Wt 253 lb 15.5 oz (115.2 kg)   SpO2 96%   BMI 30.51 kg/m   Physical Exam Vitals and nursing note reviewed.  Constitutional:      General: He is not in acute distress.    Appearance: Normal appearance. He is not ill-appearing.  HENT:     Head: Normocephalic and atraumatic.     Nose: Congestion present.      Mouth/Throat:     Mouth: Mucous membranes are moist.     Pharynx: Oropharynx is clear. No oropharyngeal exudate or posterior oropharyngeal erythema.  Eyes:     Conjunctiva/sclera: Conjunctivae normal.  Cardiovascular:     Rate and Rhythm: Normal rate and regular rhythm.     Heart sounds: Normal heart sounds. No murmur heard. Pulmonary:     Effort: Pulmonary effort is normal. No respiratory distress.     Breath sounds: Normal breath sounds. No wheezing, rhonchi or rales.  Skin:    General: Skin is warm and dry.  Neurological:     Mental Status: He is alert.  Psychiatric:        Mood and Affect: Mood normal.        Thought Content: Thought content normal.      UC Treatments / Results  Labs (all labs ordered are listed, but only abnormal results are displayed) Labs Reviewed  SARS CORONAVIRUS 2 (TAT 6-24 HRS)    EKG   Radiology No results found.  Procedures Procedures (including critical care time)  Medications Ordered in UC Medications - No data to display  Initial Impression / Assessment and Plan / UC Course  I have reviewed the triage vital signs and the nursing notes.  Pertinent labs & imaging results that were available during my care of the patient were reviewed by me and considered in my medical decision making (see chart for details).    Suspect viral etiology of symptoms.  COVID screening ordered.  Will await results further recommendation but encouraged follow-up with  any worsening symptoms or failure to improve.  Recommended symptomatic treatment, increase fluids and rest.  Final Clinical Impressions(s) / UC Diagnoses   Final diagnoses:  Acute upper respiratory infection   Discharge Instructions   None    ED Prescriptions   None    PDMP not reviewed this encounter.   Tomi Bamberger, PA-C 07/24/23 1531

## 2023-07-25 LAB — SARS CORONAVIRUS 2 (TAT 6-24 HRS): SARS Coronavirus 2: POSITIVE — AB

## 2023-08-23 NOTE — Progress Notes (Signed)
Office Visit Note  Patient: Jeffery Houston             Date of Birth: 28-Feb-1971           MRN: [id number]             PCP: Ivonne Andrew, NP Referring: Fanny Dance, MD Visit Date: 09/05/2023 Occupation: @GUAROCC @  Subjective:  Pain in multiple joints  History of Present Illness: Jeffery Houston is a 52 y.o. male seen in consultation per request of Dr. Natale Lay for evaluation of generalized aches and several arthroscopic surgeries on his knee joints.  Later he underwent left partial knee replacement in 2016 and right partial knee replacement in 2017 by Dr. Eulah Pont.  He had good response to the knee replacement surgeries.  He states over time he has been hurting in multiple joints.  He has been told that he has degenerative disc disease of his cervical and lumbar spine by Dr. Eulah Pont.  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  He uses his hands all the time.  He has discomfort in his bilateral hands, bilateral wrist joints and bilateral shoulders.  He also complains of discomfort in his hips.  He has noted some swelling on his wrist joints and hands.  None of the other joints are painful or swollen.  Patient states that 4 days ago he fell while working.  He states he has been having increased pain and discomfort in his left knee joint since then.  He is having difficulty walking.  His family history of lupus in his first cousin and also family history of rheumatoid arthritis and his first cousin.  He has 1 child who has arthritis in his feet.    Activities of Daily Living:  Patient reports morning stiffness for several hours.   Patient Reports nocturnal pain.  Difficulty dressing/grooming: Reports Difficulty climbing stairs: Reports Difficulty getting out of chair: Reports Difficulty using hands for taps, buttons, cutlery, and/or writing: Reports  Review of Systems  Constitutional:  Negative for fatigue.  HENT:  Negative for mouth sores and mouth dryness.    Eyes:  Negative for dryness.  Respiratory:  Negative for shortness of breath.   Cardiovascular:  Negative for chest pain and palpitations.  Gastrointestinal:  Positive for blood in stool. Negative for constipation and diarrhea.       History of hemorrhoids  Endocrine: Negative for increased urination.  Genitourinary:  Negative for involuntary urination.  Musculoskeletal:  Positive for joint pain, gait problem, joint pain, joint swelling, myalgias, morning stiffness, muscle tenderness and myalgias. Negative for muscle weakness.  Skin:  Negative for color change, rash and sensitivity to sunlight.  Allergic/Immunologic: Negative for susceptible to infections.  Neurological:  Negative for dizziness and headaches.  Hematological:  Negative for swollen glands.  Psychiatric/Behavioral:  Positive for sleep disturbance. Negative for depressed mood. The patient is not nervous/anxious.     PMFS History:  Patient Active Problem List   Diagnosis Date Noted   Chronic pain of multiple joints 03/06/2023   S/P total knee arthroplasty, left 12/25/2022   Lipid screening 12/21/2022   Diabetic neuropathy (HCC) 12/17/2022   Pain due to onychomycosis of toenails of both feet 12/17/2022   Statin intolerance 12/12/2022   Adverse effect of statin 12/12/2022   Acute recurrent sinusitis 12/12/2022   Hyperlipidemia 07/25/2022   Uncontrolled type 2 diabetes mellitus with hyperglycemia (HCC) 06/18/2022   Pneumonia due to COVID-19 virus 09/01/2020   Closed fracture  of nasal bones 01/26/2019   Shortness of breath 01/23/2019   Laboratory examination 01/23/2019   Exertional chest pain 01/23/2019   Primary osteoarthritis of right knee 09/24/2016   Essential hypertension 09/24/2016   S/P left unicompartmental knee replacement 07/22/2015    Past Medical History:  Diagnosis Date   Arthritis    bil knees   Diabetes mellitus without complication (HCC)    Exertional chest pain 01/23/2019   Exertional dyspnea  01/23/2019   Hypertension     Family History  Problem Relation Age of Onset   Diabetes Mother    Hypertension Mother    Depression Mother    Kidney failure Mother    Hypertension Father    Hypertension Brother    Heart attack Brother    Healthy Son    Rheum arthritis Cousin    Past Surgical History:  Procedure Laterality Date   FOOT SURGERY Bilateral    4th and 5th digits on both feet   PARTIAL KNEE ARTHROPLASTY Left 07/22/2015   Procedure: LEF PARTIAL KNEE REPLACEMENT ;  Surgeon: Sheral Apley, MD;  Location: [facility];  Service: Orthopedics;  Laterality: Left;   PARTIAL KNEE ARTHROPLASTY Right 10/05/2016   Procedure: UNICOMPARTMENTAL KNEE;  Surgeon: Sheral Apley, MD;  [facility];  Service: Orthopedics;  Laterality: Right;  Pre/Post Op femoral nerve block   TOTAL KNEE REVISION Left 12/25/2022   Procedure: TOTAL KNEE REVISION;  Surgeon: Sheral Apley, MD;  Location: [facility];  Service: Orthopedics;  Laterality: Left;   WRIST SURGERY Right    tendon repair   Social History   Social History Narrative   Not on file   Immunization History  Administered Date(s) Administered   Tdap 04/11/2013, 05/26/2019     Objective: Vital Signs: BP (!) 147/94 (BP Location: Right Arm, Patient Position: Sitting, Cuff Size: Large)   Pulse 80   Resp 18   Ht 6\' 4"  (1.93 m)   Wt 255 lb 9.6 oz (115.9 kg)   BMI 31.11 kg/m    Physical Exam Vitals and nursing note reviewed.  Constitutional:      Appearance: He is well-developed.  HENT:     Head: Normocephalic and atraumatic.  Eyes:     Conjunctiva/sclera: Conjunctivae normal.     Pupils: Pupils are equal, round, and reactive to light.  Cardiovascular:     Rate and Rhythm: Normal rate and regular rhythm.     Heart sounds: Normal heart sounds.  Pulmonary:     Effort: Pulmonary effort is normal.     Breath sounds: Normal breath sounds.  Abdominal:     General: Bowel sounds are normal.      Palpations: Abdomen is soft.  Musculoskeletal:     Cervical back: Normal range of motion and neck supple.     Right lower leg: Edema present.     Left lower leg: Edema present.  Skin:    General: Skin is warm and dry.     Capillary Refill: Capillary refill takes less than 2 seconds.  Neurological:     Mental Status: He is alert and oriented to person, place, and time.  Psychiatric:        Behavior: Behavior normal.      Musculoskeletal Exam: Patient had discomfort range of motion of the cervical and lumbar spine.  Shoulder joints were in good range of motion without any discomfort.  Elbow joints and wrist joints with good range of motion.  No synovitis was noted.  Bilateral PIP and DIP thickening with no synovitis was noted.  Hip joints were in good range of motion without discomfort.  Bilateral knee joints were replaced.  He had warmth and swelling in his left knee joint.  Bilateral pedal edema was noted.  There was no tenderness over ankles or MTPs.  CDAI Exam: CDAI Score: -- Patient Global: --; Provider Global: -- Swollen: --; Tender: -- Joint Exam 09/05/2023   No joint exam has been documented for this visit   There is currently no information documented on the homunculus. Go to the Rheumatology activity and complete the homunculus joint exam.  Investigation: No additional findings.  Imaging: No results found.  Recent Labs: Lab Results  Component Value Date   WBC 9.5 12/12/2022   HGB 13.8 12/12/2022   PLT 374 12/12/2022   NA 142 12/21/2022   K 4.5 12/21/2022   CL 105 12/21/2022   CO2 23 12/21/2022   GLUCOSE 146 (H) 12/21/2022   BUN 15 12/21/2022   CREATININE 0.86 12/21/2022   BILITOT 0.6 06/18/2022   ALKPHOS 97 06/18/2022   AST 12 06/18/2022   ALT 23 06/18/2022   PROT 7.4 06/18/2022   ALBUMIN 4.6 06/18/2022   CALCIUM 9.5 12/21/2022   GFRAA 111 10/17/2020    Speciality Comments: No specialty comments available.  Procedures:  No procedures  performed Allergies: Bee venom, Crestor [rosuvastatin calcium], and Latex   Assessment / Plan:     Visit Diagnoses: Polyarthralgia -patient complains of pain and discomfort in multiple joints for many years.  He states the pain has been worse for the last 3 years.  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  He states all of these activities are really hard on his joints.  He notices intermittent swelling in his hands.  12/21/22: ANA negative, ESR 10, CRP<1, RF<10 - Plan: Cyclic citrul peptide antibody, IgG  Chronic pain of both shoulders -he complains of intermittent discomfort in his shoulders.  His shoulders were in good range of motion without any discomfort today.  Plan: XR Shoulder Left, XR Shoulder Right.  Right acromioclavicular arthritis was noted.  Left shoulder joint x-rays were unremarkable.  He had MRI of the right shoulder joint in 2019 which showed supraspinatus and infraspinatus tendinosis with intrasubstance insertional tear.  Mild glenohumeral degenerative changes were also noted.  Moderate acromioclavicular degenerative changes were noted per report.  We will notify patient of the x-ray results.  If anti-CCP is negative then I will cancel the follow-up appointment.  Most of the discomfort is coming from underlying osteoarthritis.  He will benefit from pain management.  Pain in both hands -he complains of pain and discomfort in the bilateral hands and wrist joints.  He also gives history of intermittent swelling.  No synovitis was noted on the examination today.  Plan: XR Hand 2 View Right, XR Hand 2 View Left.  X-rays of bilateral hands were suggestive of osteoarthritis.  Status post right partial knee replacement - 2017, by Dr. Eulah Pont.  He had good range of motion without discomfort.  S/P left unicompartmental knee replacement - 2016 , by Dr. Eulah Pont.  He had a recent fall 4 days ago.  He states the left knee joint pain has been worse since then.  He had warmth and an  effusion in his left knee joint.  Neck pain-he had discomfort with range of motion of the cervical spine.  Patient states Dr. Eulah Pont has done x-rays and told him that he has degenerative  disc disease.  Chronic midline low back pain without sciatica-he has chronic discomfort in his lower back.  Patient states Dr. Eulah Pont has done x-rays and told him that he has nerve impingement.  I do not have those studies to review.  Essential hypertension-blood pressure was elevated at 147/94.  Patient states he did not take his blood pressure medication.  Repeat blood pressure was 151/88.  He was advised to monitor blood pressure closely and follow-up with his PCP.  Other medical problems are listed as follows:  Uncontrolled type 2 diabetes mellitus with hyperglycemia (HCC)  Diabetic polyneuropathy associated with type 2 diabetes mellitus (HCC)  Adverse effect of statin  History of hyperlipidemia  Pain due to onychomycosis of toenails of both feet  Pneumonia due to COVID-19 virus - 06/2023  Orders: Orders Placed This Encounter  Procedures   XR Shoulder Left   XR Shoulder Right   XR Hand 2 View Right   XR Hand 2 View Left   Cyclic citrul peptide antibody, IgG   No orders of the defined types were placed in this encounter.  .  Follow-Up Instructions: Return for Osteoarthritis.   Pollyann Savoy, MD  Note - This record has been created using Animal nutritionist.  Chart creation errors have been sought, but may not always  have been located. Such creation errors do not reflect on  the standard of medical care.

## 2023-09-03 ENCOUNTER — Other Ambulatory Visit: Payer: Self-pay

## 2023-09-05 ENCOUNTER — Ambulatory Visit: Payer: Medicaid Other

## 2023-09-05 ENCOUNTER — Encounter: Payer: Self-pay | Admitting: Rheumatology

## 2023-09-05 ENCOUNTER — Ambulatory Visit: Payer: Medicaid Other | Attending: Rheumatology | Admitting: Rheumatology

## 2023-09-05 ENCOUNTER — Telehealth: Payer: Self-pay

## 2023-09-05 VITALS — BP 141/89 | HR 84 | Resp 18 | Ht 76.0 in | Wt 255.6 lb

## 2023-09-05 DIAGNOSIS — U071 COVID-19: Secondary | ICD-10-CM

## 2023-09-05 DIAGNOSIS — J1282 Pneumonia due to coronavirus disease 2019: Secondary | ICD-10-CM

## 2023-09-05 DIAGNOSIS — E1165 Type 2 diabetes mellitus with hyperglycemia: Secondary | ICD-10-CM

## 2023-09-05 DIAGNOSIS — M25512 Pain in left shoulder: Secondary | ICD-10-CM

## 2023-09-05 DIAGNOSIS — M25511 Pain in right shoulder: Secondary | ICD-10-CM | POA: Diagnosis not present

## 2023-09-05 DIAGNOSIS — Z96651 Presence of right artificial knee joint: Secondary | ICD-10-CM

## 2023-09-05 DIAGNOSIS — M2550 Pain in unspecified joint: Secondary | ICD-10-CM | POA: Diagnosis not present

## 2023-09-05 DIAGNOSIS — M79674 Pain in right toe(s): Secondary | ICD-10-CM

## 2023-09-05 DIAGNOSIS — M79641 Pain in right hand: Secondary | ICD-10-CM | POA: Diagnosis not present

## 2023-09-05 DIAGNOSIS — M79642 Pain in left hand: Secondary | ICD-10-CM

## 2023-09-05 DIAGNOSIS — M1711 Unilateral primary osteoarthritis, right knee: Secondary | ICD-10-CM

## 2023-09-05 DIAGNOSIS — T466X5A Adverse effect of antihyperlipidemic and antiarteriosclerotic drugs, initial encounter: Secondary | ICD-10-CM

## 2023-09-05 DIAGNOSIS — M542 Cervicalgia: Secondary | ICD-10-CM

## 2023-09-05 DIAGNOSIS — G8929 Other chronic pain: Secondary | ICD-10-CM

## 2023-09-05 DIAGNOSIS — Z96652 Presence of left artificial knee joint: Secondary | ICD-10-CM

## 2023-09-05 DIAGNOSIS — M79675 Pain in left toe(s): Secondary | ICD-10-CM

## 2023-09-05 DIAGNOSIS — M545 Low back pain, unspecified: Secondary | ICD-10-CM

## 2023-09-05 DIAGNOSIS — I10 Essential (primary) hypertension: Secondary | ICD-10-CM

## 2023-09-05 DIAGNOSIS — E1142 Type 2 diabetes mellitus with diabetic polyneuropathy: Secondary | ICD-10-CM

## 2023-09-05 DIAGNOSIS — B351 Tinea unguium: Secondary | ICD-10-CM

## 2023-09-05 DIAGNOSIS — Z8639 Personal history of other endocrine, nutritional and metabolic disease: Secondary | ICD-10-CM

## 2023-09-05 NOTE — Patient Instructions (Signed)
Shoulder Exercises Ask your health care provider which exercises are safe for you. Do exercises exactly as told by your health care provider and adjust them as directed. It is normal to feel mild stretching, pulling, tightness, or discomfort as you do these exercises. Stop right away if you feel sudden pain or your pain gets worse. Do not begin these exercises until told by your health care provider. Stretching exercises External rotation and abduction This exercise is sometimes called corner stretch. The exercise rotates your arm outward (external rotation) and moves your arm out from your body (abduction). Stand in a doorway with one of your feet slightly in front of the other. This is called a staggered stance. If you cannot reach your forearms to the door frame, stand facing a corner of a room. Choose one of the following positions as told by your health care provider: Place your hands and forearms on the door frame above your head. Place your hands and forearms on the door frame at the height of your head. Place your hands on the door frame at the height of your elbows. Slowly move your weight onto your front foot until you feel a stretch across your chest and in the front of your shoulders. Keep your head and chest upright and keep your abdominal muscles tight. Hold for __________ seconds. To release the stretch, shift your weight to your back foot. Repeat __________ times. Complete this exercise __________ times a day. Extension, standing  Stand and hold a broomstick, a cane, or a similar object behind your back. Your hands should be a little wider than shoulder-width apart. Your palms should face away from your back. Keeping your elbows straight and your shoulder muscles relaxed, move the stick away from your body until you feel a stretch in your shoulders (extension). Avoid shrugging your shoulders while you move the stick. Keep your shoulder blades tucked down toward the middle of your  back. Hold for __________ seconds. Slowly return to the starting position. Repeat __________ times. Complete this exercise __________ times a day. Range-of-motion exercises Pendulum  Stand near a wall or a surface that you can hold onto for balance. Bend at the waist and let your left / right arm hang straight down. Use your other arm to support you. Keep your back straight and do not lock your knees. Relax your left / right arm and shoulder muscles, and move your hips and your trunk so your left / right arm swings freely. Your arm should swing because of the motion of your body, not because you are using your arm or shoulder muscles. Keep moving your hips and trunk so your arm swings in the following directions, as told by your health care provider: Side to side. Forward and backward. In clockwise and counterclockwise circles. Continue each motion for __________ seconds, or for as long as told by your health care provider. Slowly return to the starting position. Repeat __________ times. Complete this exercise __________ times a day. Shoulder flexion, standing  Stand and hold a broomstick, a cane, or a similar object. Place your hands a little more than shoulder-width apart on the object. Your left / right hand should be palm-up, and your other hand should be palm-down. Keep your elbow straight and your shoulder muscles relaxed. Push the stick up with your healthy arm to raise your left / right arm in front of your body, and then over your head until you feel a stretch in your shoulder (flexion). Avoid shrugging your shoulder  while you raise your arm. Keep your shoulder blade tucked down toward the middle of your back. Hold for __________ seconds. Slowly return to the starting position. Repeat __________ times. Complete this exercise __________ times a day. Shoulder abduction, standing  Stand and hold a broomstick, a cane, or a similar object. Place your hands a little more than  shoulder-width apart on the object. Your left / right hand should be palm-up, and your other hand should be palm-down. Keep your elbow straight and your shoulder muscles relaxed. Push the object across your body toward your left / right side. Raise your left / right arm to the side of your body (abduction) until you feel a stretch in your shoulder. Do not raise your arm above shoulder height unless your health care provider tells you to do that. If directed, raise your arm over your head. Avoid shrugging your shoulder while you raise your arm. Keep your shoulder blade tucked down toward the middle of your back. Hold for __________ seconds. Slowly return to the starting position. Repeat __________ times. Complete this exercise __________ times a day. Internal rotation  Place your left / right hand behind your back, palm-up. Use your other hand to dangle an exercise band, a broomstick, or a similar object over your shoulder. Grasp the band with your left / right hand so you are holding on to both ends. Gently pull up on the band until you feel a stretch in the front of your left / right shoulder. The movement of your arm toward the center of your body is called internal rotation. Avoid shrugging your shoulder while you raise your arm. Keep your shoulder blade tucked down toward the middle of your back. Hold for __________ seconds. Release the stretch by letting go of the band and lowering your hands. Repeat __________ times. Complete this exercise __________ times a day. Strengthening exercises External rotation  Sit in a stable chair without armrests. Secure an exercise band to a stable object at elbow height on your left / right side. Place a soft object, such as a folded towel or a small pillow, between your left / right upper arm and your body to move your elbow about 4 inches (10 cm) away from your side. Hold the end of the exercise band so it is tight and there is no slack. Keeping your  elbow pressed against the soft object, slowly move your forearm out, away from your abdomen (external rotation). Keep your body steady so only your forearm moves. Hold for __________ seconds. Slowly return to the starting position. Repeat __________ times. Complete this exercise __________ times a day. Shoulder abduction  Sit in a stable chair without armrests, or stand up. Hold a __________ lb / kg weight in your left / right hand, or hold an exercise band with both hands. Start with your arms straight down and your left / right palm facing in, toward your body. Slowly lift your left / right hand out to your side (abduction). Do not lift your hand above shoulder height unless your health care provider tells you that this is safe. Keep your arms straight. Avoid shrugging your shoulder while you do this movement. Keep your shoulder blade tucked down toward the middle of your back. Hold for __________ seconds. Slowly lower your arm, and return to the starting position. Repeat __________ times. Complete this exercise __________ times a day. Shoulder extension  Sit in a stable chair without armrests, or stand up. Secure an exercise band to a  stable object in front of you so it is at shoulder height. Hold one end of the exercise band in each hand. Straighten your elbows and lift your hands up to shoulder height. Squeeze your shoulder blades together as you pull your hands down to the sides of your thighs (extension). Stop when your hands are straight down by your sides. Do not let your hands go behind your body. Hold for __________ seconds. Slowly return to the starting position. Repeat __________ times. Complete this exercise __________ times a day. Shoulder row  Sit in a stable chair without armrests, or stand up. Secure an exercise band to a stable object in front of you so it is at chest height. Hold one end of the exercise band in each hand. Position your palms so that your thumbs are  facing the ceiling (neutral position). Bend each of your elbows to a 90-degree angle (right angle) and keep your upper arms at your sides. Step back or move the chair back until the band is tight and there is no slack. Slowly pull your elbows back behind you. Hold for __________ seconds. Slowly return to the starting position. Repeat __________ times. Complete this exercise __________ times a day. Shoulder press-ups  Sit in a stable chair that has armrests. Sit upright, with your feet flat on the floor. Put your hands on the armrests so your elbows are bent and your fingers are pointing forward. Your hands should be about even with the sides of your body. Push down on the armrests and use your arms to lift yourself off the chair. Straighten your elbows and lift yourself up as much as you comfortably can. Move your shoulder blades down, and avoid letting your shoulders move up toward your ears. Keep your feet on the ground. As you get stronger, your feet should support less of your body weight as you lift yourself up. Hold for __________ seconds. Slowly lower yourself back into the chair. Repeat __________ times. Complete this exercise __________ times a day. Wall push-ups  Stand so you are facing a stable wall. Your feet should be about one arm-length away from the wall. Lean forward and place your palms on the wall at shoulder height. Keep your feet flat on the floor as you bend your elbows and lean forward toward the wall. Hold for __________ seconds. Straighten your elbows to push yourself back to the starting position. Repeat __________ times. Complete this exercise __________ times a day. This information is not intended to replace advice given to you by your health care provider. Make sure you discuss any questions you have with your health care provider. Document Revised: 12/26/2021 Document Reviewed: 12/26/2021 [employer] Patient Education  2024 [employer] Hand Exercises Hand  exercises can be helpful for almost anyone. They can strengthen your hands and improve flexibility and movement. The exercises can also increase blood flow to the hands. These results can make your work and daily tasks easier for you. Hand exercises can be especially helpful for people who have joint pain from arthritis or nerve damage from using their hands over and over. These exercises can also help people who injure a hand. Exercises Most of these hand exercises are gentle stretching and motion exercises. It is usually safe to do them often throughout the day. Warming up your hands before exercise may help reduce stiffness. You can do this with gentle massage or by placing your hands in warm water for 10-15 minutes. It is normal to feel some stretching, pulling,  tightness, or mild discomfort when you begin new exercises. In time, this will improve. Remember to always be careful and stop right away if you feel sudden, very bad pain or your pain gets worse. You want to get better and be safe. Ask your health care provider which exercises are safe for you. Do exercises exactly as told by your provider and adjust them as told. Do not begin these exercises until told by your provider. Knuckle bend or "claw" fist  Stand or sit with your arm, hand, and all five fingers pointed straight up. Make sure to keep your wrist straight. Gently bend your fingers down toward your palm until the tips of your fingers are touching your palm. Keep your big knuckle straight and only bend the small knuckles in your fingers. Hold this position for 10 seconds. Straighten your fingers back to your starting position. Repeat this exercise 5-10 times with each hand. Full finger fist  Stand or sit with your arm, hand, and all five fingers pointed straight up. Make sure to keep your wrist straight. Gently bend your fingers into your palm until the tips of your fingers are touching the middle of your palm. Hold this position  for 10 seconds. Extend your fingers back to your starting position, stretching every joint fully. Repeat this exercise 5-10 times with each hand. Straight fist  Stand or sit with your arm, hand, and all five fingers pointed straight up. Make sure to keep your wrist straight. Gently bend your fingers at the big knuckle, where your fingers meet your hand, and at the middle knuckle. Keep the knuckle at the tips of your fingers straight and try to touch the bottom of your palm. Hold this position for 10 seconds. Extend your fingers back to your starting position, stretching every joint fully. Repeat this exercise 5-10 times with each hand. Tabletop  Stand or sit with your arm, hand, and all five fingers pointed straight up. Make sure to keep your wrist straight. Gently bend your fingers at the big knuckle, where your fingers meet your hand, as far down as you can. Keep the small knuckles in your fingers straight. Think of forming a tabletop with your fingers. Hold this position for 10 seconds. Extend your fingers back to your starting position, stretching every joint fully. Repeat this exercise 5-10 times with each hand. Finger spread  Place your hand flat on a table with your palm facing down. Make sure your wrist stays straight. Spread your fingers and thumb apart from each other as far as you can until you feel a gentle stretch. Hold this position for 10 seconds. Bring your fingers and thumb tight together again. Hold this position for 10 seconds. Repeat this exercise 5-10 times with each hand. Making circles  Stand or sit with your arm, hand, and all five fingers pointed straight up. Make sure to keep your wrist straight. Make a circle by touching the tip of your thumb to the tip of your index finger. Hold for 10 seconds. Then open your hand wide. Repeat this motion with your thumb and each of your fingers. Repeat this exercise 5-10 times with each hand. Thumb motion  Sit with your  forearm resting on a table and your wrist straight. Your thumb should be facing up toward the ceiling. Keep your fingers relaxed as you move your thumb. Lift your thumb up as high as you can toward the ceiling. Hold for 10 seconds. Bend your thumb across your palm as far as  you can, reaching the tip of your thumb for the small finger (pinkie) side of your palm. Hold for 10 seconds. Repeat this exercise 5-10 times with each hand. Grip strengthening  Hold a stress ball or other soft ball in the middle of your hand. Slowly increase the pressure, squeezing the ball as much as you can without causing pain. Think of bringing the tips of your fingers into the middle of your palm. All of your finger joints should bend when doing this exercise. Hold your squeeze for 10 seconds, then relax. Repeat this exercise 5-10 times with each hand. Contact a health care provider if: Your hand pain or discomfort gets much worse when you do an exercise. Your hand pain or discomfort does not improve within 2 hours after you exercise. If you have either of these problems, stop doing these exercises right away. Do not do them again unless your provider says that you can. Get help right away if: You develop sudden, severe hand pain or swelling. If this happens, stop doing these exercises right away. Do not do them again unless your provider says that you can. This information is not intended to replace advice given to you by your health care provider. Make sure you discuss any questions you have with your health care provider. Document Revised: 11/20/2022 Document Reviewed: 11/20/2022 [employer] Patient Education  2024 [employer].

## 2023-09-05 NOTE — Telephone Encounter (Signed)
Per Dr. Corliss Skains, patient can be called with x-ray and lab results (if normal). Follow up can be canceled at that point, per Dr. Corliss Skains. Thanks!

## 2023-09-09 LAB — CYCLIC CITRUL PEPTIDE ANTIBODY, IGG: Cyclic Citrullin Peptide Ab: <16 U

## 2023-09-09 NOTE — Progress Notes (Signed)
Anti-CCP antibody negative.  I will discuss results at the follow-up visit.

## 2023-09-10 NOTE — Telephone Encounter (Signed)
Patient is mostly osteoarthritis on the x-rays.  I discussed with him that we will cancel the follow-up appointment at the labs are negative.  If patient is in agreement, please cancel the follow-up visit.

## 2023-09-10 NOTE — Telephone Encounter (Signed)
Anti-CCP antibody negative.  I will discuss results at the follow-up visit.

## 2023-09-10 NOTE — Telephone Encounter (Signed)
Patient advised is mostly osteoarthritis on the x-rays. Dr. Corliss Skains discussed with him that we will cancel the follow-up appointment at the labs are negative. If patient is in agreement, please cancel the follow-up visit. Patient is in agreement and follow up appointment cancelled.

## 2023-09-16 ENCOUNTER — Other Ambulatory Visit: Payer: Self-pay | Admitting: Nurse Practitioner

## 2023-09-16 DIAGNOSIS — E1165 Type 2 diabetes mellitus with hyperglycemia: Secondary | ICD-10-CM

## 2023-10-03 ENCOUNTER — Ambulatory Visit: Payer: Medicaid Other | Admitting: Rheumatology

## 2023-10-10 ENCOUNTER — Other Ambulatory Visit: Payer: Self-pay | Admitting: Nurse Practitioner

## 2023-12-05 ENCOUNTER — Other Ambulatory Visit (HOSPITAL_BASED_OUTPATIENT_CLINIC_OR_DEPARTMENT_OTHER): Payer: Self-pay

## 2023-12-23 ENCOUNTER — Other Ambulatory Visit: Payer: Self-pay

## 2024-04-17 ENCOUNTER — Other Ambulatory Visit: Payer: Self-pay

## 2024-05-14 ENCOUNTER — Telehealth: Payer: Self-pay

## 2024-05-14 NOTE — Telephone Encounter (Signed)
 Copied from CRM [id number]. Topic: Medical Record Request - Records Request >> May 14, 2024  4:42 PM Travis F wrote: Reason for CRM: Patient is calling in because he needs his medical records faxed over to. [phone].  Please contact pt to sign form or log in my chart and request records. KH
